# Patient Record
Sex: Male | Born: 1950 | Race: White | Hispanic: No | Marital: Married | Smoking: Former smoker
Health system: Southern US, Community
[De-identification: ages and names within clinical notes are randomized; demographics above are authoritative.]

## PROBLEM LIST (undated history)

## (undated) ENCOUNTER — Emergency Department: Discharge status: Absent without leave | Payer: Medicare Other

## (undated) DIAGNOSIS — F32A Depression, unspecified: Secondary | ICD-10-CM

## (undated) DIAGNOSIS — F419 Anxiety disorder, unspecified: Secondary | ICD-10-CM

## (undated) DIAGNOSIS — Z8249 Family history of ischemic heart disease and other diseases of the circulatory system: Secondary | ICD-10-CM

## (undated) DIAGNOSIS — I4892 Unspecified atrial flutter: Secondary | ICD-10-CM

## (undated) DIAGNOSIS — R0600 Dyspnea, unspecified: Secondary | ICD-10-CM

## (undated) DIAGNOSIS — I219 Acute myocardial infarction, unspecified: Secondary | ICD-10-CM

## (undated) DIAGNOSIS — E669 Obesity, unspecified: Secondary | ICD-10-CM

## (undated) DIAGNOSIS — E118 Type 2 diabetes mellitus with unspecified complications: Secondary | ICD-10-CM

## (undated) DIAGNOSIS — R059 Cough, unspecified: Secondary | ICD-10-CM

## (undated) DIAGNOSIS — I499 Cardiac arrhythmia, unspecified: Secondary | ICD-10-CM

## (undated) DIAGNOSIS — M1990 Unspecified osteoarthritis, unspecified site: Secondary | ICD-10-CM

## (undated) DIAGNOSIS — R011 Cardiac murmur, unspecified: Secondary | ICD-10-CM

## (undated) DIAGNOSIS — R05 Cough: Secondary | ICD-10-CM

## (undated) DIAGNOSIS — T8859XA Other complications of anesthesia, initial encounter: Secondary | ICD-10-CM

## (undated) DIAGNOSIS — I2510 Atherosclerotic heart disease of native coronary artery without angina pectoris: Secondary | ICD-10-CM

## (undated) DIAGNOSIS — E785 Hyperlipidemia, unspecified: Secondary | ICD-10-CM

## (undated) DIAGNOSIS — I10 Essential (primary) hypertension: Secondary | ICD-10-CM

## (undated) DIAGNOSIS — F329 Major depressive disorder, single episode, unspecified: Secondary | ICD-10-CM

## (undated) DIAGNOSIS — R918 Other nonspecific abnormal finding of lung field: Secondary | ICD-10-CM

## (undated) DIAGNOSIS — T4145XA Adverse effect of unspecified anesthetic, initial encounter: Secondary | ICD-10-CM

## (undated) DIAGNOSIS — K219 Gastro-esophageal reflux disease without esophagitis: Secondary | ICD-10-CM

## (undated) HISTORY — DX: Unspecified atrial flutter: I48.92

## (undated) HISTORY — PX: CORONARY ARTERY BYPASS GRAFT: SHX141

## (undated) HISTORY — PX: CARDIAC SURGERY: SHX584

## (undated) SURGERY — CARDIOVERSION (CATH LAB)
Anesthesia: Moderate Sedation

## (undated) SURGERY — ECHOCARDIOGRAM, TRANSESOPHAGEAL
Anesthesia: Moderate Sedation

---
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
## 2002-12-25 HISTORY — PX: CARDIAC CATHETERIZATION: SHX172

## 2006-02-04 ENCOUNTER — Other Ambulatory Visit: Payer: Self-pay

## 2006-02-04 ENCOUNTER — Emergency Department: Payer: Self-pay | Admitting: Emergency Medicine

## 2007-01-02 ENCOUNTER — Emergency Department: Payer: Self-pay | Admitting: Unknown Physician Specialty

## 2007-01-02 ENCOUNTER — Other Ambulatory Visit: Payer: Self-pay

## 2008-10-19 ENCOUNTER — Emergency Department: Payer: Self-pay | Admitting: Emergency Medicine

## 2009-01-14 ENCOUNTER — Ambulatory Visit: Payer: Self-pay | Admitting: Gastroenterology

## 2009-01-17 ENCOUNTER — Emergency Department: Payer: Self-pay | Admitting: Emergency Medicine

## 2010-06-26 ENCOUNTER — Emergency Department: Payer: Self-pay | Admitting: Internal Medicine

## 2011-03-05 ENCOUNTER — Emergency Department: Payer: Self-pay | Admitting: Emergency Medicine

## 2011-11-14 ENCOUNTER — Observation Stay: Payer: Self-pay | Admitting: Internal Medicine

## 2013-10-24 DIAGNOSIS — G8929 Other chronic pain: Secondary | ICD-10-CM | POA: Insufficient documentation

## 2014-12-28 DIAGNOSIS — Z01818 Encounter for other preprocedural examination: Secondary | ICD-10-CM | POA: Insufficient documentation

## 2015-03-01 DIAGNOSIS — I10 Essential (primary) hypertension: Secondary | ICD-10-CM | POA: Diagnosis present

## 2015-03-01 DIAGNOSIS — E119 Type 2 diabetes mellitus without complications: Secondary | ICD-10-CM | POA: Diagnosis present

## 2015-03-01 DIAGNOSIS — R079 Chest pain, unspecified: Secondary | ICD-10-CM | POA: Diagnosis present

## 2015-03-01 DIAGNOSIS — I252 Old myocardial infarction: Secondary | ICD-10-CM | POA: Insufficient documentation

## 2016-07-30 ENCOUNTER — Emergency Department
Admission: EM | Admit: 2016-07-30 | Discharge: 2016-07-30 | Disposition: A | Discharge status: Home / Self Care | Payer: Medicare Other | Attending: Emergency Medicine | Admitting: Emergency Medicine

## 2016-07-30 ENCOUNTER — Emergency Department: Payer: Medicare Other

## 2016-07-30 ENCOUNTER — Encounter: Payer: Self-pay | Admitting: Emergency Medicine

## 2016-07-30 DIAGNOSIS — M25469 Effusion, unspecified knee: Secondary | ICD-10-CM | POA: Insufficient documentation

## 2016-07-30 DIAGNOSIS — Z7982 Long term (current) use of aspirin: Secondary | ICD-10-CM | POA: Insufficient documentation

## 2016-07-30 DIAGNOSIS — I2510 Atherosclerotic heart disease of native coronary artery without angina pectoris: Secondary | ICD-10-CM | POA: Insufficient documentation

## 2016-07-30 DIAGNOSIS — Z79899 Other long term (current) drug therapy: Secondary | ICD-10-CM | POA: Diagnosis not present

## 2016-07-30 DIAGNOSIS — Z87891 Personal history of nicotine dependence: Secondary | ICD-10-CM | POA: Insufficient documentation

## 2016-07-30 DIAGNOSIS — Z7984 Long term (current) use of oral hypoglycemic drugs: Secondary | ICD-10-CM | POA: Diagnosis not present

## 2016-07-30 DIAGNOSIS — M25562 Pain in left knee: Secondary | ICD-10-CM | POA: Diagnosis present

## 2016-07-30 DIAGNOSIS — E119 Type 2 diabetes mellitus without complications: Secondary | ICD-10-CM | POA: Diagnosis not present

## 2016-07-30 DIAGNOSIS — I252 Old myocardial infarction: Secondary | ICD-10-CM | POA: Diagnosis not present

## 2016-07-30 HISTORY — DX: Atherosclerotic heart disease of native coronary artery without angina pectoris: I25.10

## 2016-07-30 HISTORY — DX: Unspecified osteoarthritis, unspecified site: M19.90

## 2016-07-30 LAB — BASIC METABOLIC PANEL
Anion gap: 11 (ref 5–15)
BUN: 16 mg/dL (ref 6–20)
CO2: 22 mmol/L (ref 22–32)
Calcium: 9.5 mg/dL (ref 8.9–10.3)
Chloride: 102 mmol/L (ref 101–111)
Creatinine, Ser: 0.72 mg/dL (ref 0.61–1.24)
GFR calc Af Amer: >60 mL/min (ref >60)
GFR calc non Af Amer: >60 mL/min (ref >60)
Glucose, Bld: 200 mg/dL — ABNORMAL HIGH (ref 65–99)
Potassium: 4.3 mmol/L (ref 3.5–5.1)
Sodium: 135 mmol/L (ref 135–145)

## 2016-07-30 LAB — CBC WITH DIFFERENTIAL/PLATELET
Basophils Absolute: 0.1 10*3/uL (ref 0–0.1)
Basophils Relative: 1 %
Eosinophils Absolute: 0.7 10*3/uL (ref 0–0.7)
Eosinophils Relative: 8 %
HCT: 42.6 % (ref 40.0–52.0)
Hemoglobin: 15.6 g/dL (ref 13.0–18.0)
Lymphocytes Relative: 18 %
Lymphs Abs: 1.6 10*3/uL (ref 1.0–3.6)
MCH: 33.1 pg (ref 26.0–34.0)
MCHC: 36.5 g/dL — ABNORMAL HIGH (ref 32.0–36.0)
MCV: 90.5 fL (ref 80.0–100.0)
Monocytes Absolute: 0.8 10*3/uL (ref 0.2–1.0)
Monocytes Relative: 9 %
Neutro Abs: 5.9 10*3/uL (ref 1.4–6.5)
Neutrophils Relative %: 64 %
Platelets: 279 10*3/uL (ref 150–440)
RBC: 4.71 MIL/uL (ref 4.40–5.90)
RDW: 13 % (ref 11.5–14.5)
WBC: 9 10*3/uL (ref 3.8–10.6)

## 2016-07-30 LAB — SEDIMENTATION RATE: Sed Rate: 5 mm/hr (ref 0–20)

## 2016-07-30 MED ORDER — NAPROXEN 500 MG PO TABS
500.0000 mg | ORAL_TABLET | Freq: Once | ORAL | Status: AC
  Administered 2016-07-30: 500 mg via ORAL
  Filled 2016-07-30: qty 1

## 2016-07-30 MED ORDER — NAPROXEN 500 MG PO TABS: 500.0000 mg | ORAL_TABLET | Freq: Two times a day (BID) | ORAL | Status: DC

## 2016-07-30 NOTE — ED Triage Notes (Signed)
Patient states he took treatments from [facility] on 10/26, treatments to both knees.  C/O bilateral knee swelling, onset yesterday.  Patient states that swelling is listed as part of post procedure coarse.

## 2016-07-30 NOTE — ED Notes (Addendum)
See triage note.  states he had a treatment at [facility] last week.  Noticed some stiffness yesterday  But was able to move  Woke up with increased stiffness and swelling this am  Unable to bear wt

## 2016-07-30 NOTE — Discharge Instructions (Signed)
Otherwise bilateral elastic knee support.

## 2016-07-30 NOTE — ED Provider Notes (Signed)
[facility] Emergency Department Provider Note   ____________________________________________   First MD Initiated Contact with Patient 07/30/16 [phone]     (approximate)  I have reviewed the triage vital signs and the nursing notes.   HISTORY  Chief Complaint Knee Pain    HPI Derrick Gallegos is a 65 y.o. male patient complaining of bilateral knee pain status post aspiration and injection of collagen at [facility] clinic one week ago. Patient state initially felt relief from the injections but yesterday started noticing pain and stiffness in the knees. Patient awakened this morning with bilateral edema and increased stiffness with flexion of the knees. Patient denies any fever or erythema.No palliative measures for this complaint. Patient state this is a new medication different from his previous injections to his knee.  Past Medical History:  Diagnosis Date  . Arthritis   . Coronary artery disease   . Diabetes mellitus without complication ([place])   . MI, old     There are no active problems to display for this patient.   History reviewed. No pertinent surgical history.  Prior to Admission medications   Medication Sig Start Date End Date Taking? Authorizing Provider  aspirin 81 MG tablet Take 81 mg by mouth daily.   Yes Historical Provider, MD  lisinopril (PRINIVIL,ZESTRIL) 10 MG tablet Take 10 mg by mouth daily.   Yes Historical Provider, MD  metFORMIN (GLUCOPHAGE) 500 MG tablet Take 500 mg by mouth 2 (two) times daily with a meal.   Yes Historical Provider, MD  metoprolol succinate (TOPROL-XL) 25 MG 24 hr tablet Take 25 mg by mouth daily.   Yes Historical Provider, MD  naproxen (NAPROSYN) 500 MG tablet Take 1 tablet (500 mg total) by mouth 2 (two) times daily with a meal. 07/30/16   Sable Feil, PA-C    Allergies Review of patient's allergies indicates no known allergies.  History reviewed. No pertinent family history.  Social History Social  History  Substance Use Topics  . Smoking status: Former Smoker    Types: Cigarettes  . Smokeless tobacco: Never Used  . Alcohol use No    Review of Systems Constitutional: No fever/chills Eyes: No visual changes. ENT: No sore throat. Cardiovascular: Denies chest pain. Respiratory: Denies shortness of breath. Gastrointestinal: No abdominal pain.  No nausea, no vomiting.  No diarrhea.  No constipation. Genitourinary: Negative for dysuria. Musculoskeletal: Bilateral knee stiffness  Skin: Negative for rash. Neurological: Negative for headaches, focal weakness or numbness.   ____________________________________________   PHYSICAL EXAM:  VITAL SIGNS: ED Triage Vitals  Enc Vitals Group     BP 07/30/16 0714 (!) 134/98     Pulse Rate 07/30/16 0714 72     Resp 07/30/16 0714 18     Temp 07/30/16 0714 97.7 F (36.5 C)     Temp Source 07/30/16 0714 Oral     SpO2 07/30/16 0714 97 %     Weight 07/30/16 0714 270 lb (122.5 kg)     Height 07/30/16 0714 '5\' 9"'$  (1.753 m)     Head Circumference --      Peak Flow --      Pain Score 07/30/16 0715 0     Pain Loc --      Pain Edu? --      Excl. in [place]? --     Constitutional: Alert and oriented. Well appearing and in no acute distress. Eyes: Conjunctivae are normal. PERRL. EOMI. Head: Atraumatic. Nose: No congestion/rhinnorhea. Mouth/Throat: Mucous membranes are moist.  Oropharynx non-erythematous. Neck: No stridor.  No cervical spine tenderness to palpation. Hematological/Lymphatic/Immunilogical: No cervical lymphadenopathy. Cardiovascular: Normal rate, regular rhythm. Grossly normal heart sounds.  Good peripheral circulation. Respiratory: Normal respiratory effort.  No retractions. Lungs CTAB. Gastrointestinal: Soft and nontender. No distention. No abdominal bruits. No CVA tenderness. Musculoskeletal: No obvious deformity. Obvious edema bilateral anterior patella..  Bilateral joint effusions. Neurologic:  Normal speech and language. No  gross focal neurologic deficits are appreciated. No gait instability. Skin:  Skin is warm, dry and intact. No rash noted. Psychiatric: Mood and affect are normal. Speech and behavior are normal.  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 Bilateral knee effusion bilateral knee effusion with severe arthritis. _____________________________________   PROCEDURES  Procedure(s) performed: None  Procedures  Critical Care performed: No  ____________________________________________   INITIAL IMPRESSION / ASSESSMENT AND PLAN / ED COURSE  Pertinent labs & imaging results that were available during my care of the patient were reviewed by me and considered in my medical decision making (see chart for details).  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 Discussed x-ray finding with patient. Patient given discharge care instructions. Patient advised to follow orthopedics if no improvement or worsens complaining of 48 hours. Patient given a prescription for naproxen.  Clinical Course     ____________________________________________   FINAL CLINICAL IMPRESSION(S) / ED DIAGNOSES  Final diagnoses:  Knee effusion, unspecified laterality      NEW MEDICATIONS STARTED DURING THIS VISIT:  New Prescriptions   NAPROXEN (NAPROSYN) 500 MG TABLET    Take 1 tablet (500 mg total) by  mouth 2 (two) times daily with a meal.     Note:  This document was prepared using Dragon voice recognition software and may include unintentional dictation errors.    Sable Feil, PA-C 07/30/16 [place], PA-C 07/30/16 1287    Lavonia Drafts, MD 07/30/16 1002

## 2017-08-18 ENCOUNTER — Emergency Department: Payer: Medicare Other

## 2017-08-18 ENCOUNTER — Emergency Department
Admission: EM | Admit: 2017-08-18 | Discharge: 2017-08-18 | Disposition: A | Discharge status: Home / Self Care | Payer: Medicare Other | Attending: Emergency Medicine | Admitting: Emergency Medicine

## 2017-08-18 ENCOUNTER — Encounter: Payer: Self-pay | Admitting: Emergency Medicine

## 2017-08-18 DIAGNOSIS — R600 Localized edema: Secondary | ICD-10-CM | POA: Diagnosis present

## 2017-08-18 DIAGNOSIS — I2510 Atherosclerotic heart disease of native coronary artery without angina pectoris: Secondary | ICD-10-CM | POA: Diagnosis not present

## 2017-08-18 DIAGNOSIS — Z7982 Long term (current) use of aspirin: Secondary | ICD-10-CM | POA: Insufficient documentation

## 2017-08-18 DIAGNOSIS — R202 Paresthesia of skin: Secondary | ICD-10-CM | POA: Diagnosis not present

## 2017-08-18 DIAGNOSIS — E119 Type 2 diabetes mellitus without complications: Secondary | ICD-10-CM | POA: Insufficient documentation

## 2017-08-18 DIAGNOSIS — Z7984 Long term (current) use of oral hypoglycemic drugs: Secondary | ICD-10-CM | POA: Diagnosis not present

## 2017-08-18 DIAGNOSIS — Z791 Long term (current) use of non-steroidal anti-inflammatories (NSAID): Secondary | ICD-10-CM | POA: Diagnosis not present

## 2017-08-18 DIAGNOSIS — Z79899 Other long term (current) drug therapy: Secondary | ICD-10-CM | POA: Insufficient documentation

## 2017-08-18 DIAGNOSIS — Z87891 Personal history of nicotine dependence: Secondary | ICD-10-CM | POA: Insufficient documentation

## 2017-08-18 LAB — CBC WITH DIFFERENTIAL/PLATELET
Basophils Absolute: 0.1 10*3/uL (ref 0–0.1)
Basophils Relative: 1 %
Eosinophils Absolute: 0.2 10*3/uL (ref 0–0.7)
Eosinophils Relative: 2 %
HCT: 43 % (ref 40.0–52.0)
Hemoglobin: 15.2 g/dL (ref 13.0–18.0)
Lymphocytes Relative: 31 %
Lymphs Abs: 2.8 10*3/uL (ref 1.0–3.6)
MCH: 32.6 pg (ref 26.0–34.0)
MCHC: 35.2 g/dL (ref 32.0–36.0)
MCV: 92.4 fL (ref 80.0–100.0)
Monocytes Absolute: 0.7 10*3/uL (ref 0.2–1.0)
Monocytes Relative: 8 %
Neutro Abs: 5.2 10*3/uL (ref 1.4–6.5)
Neutrophils Relative %: 58 %
Platelets: 315 10*3/uL (ref 150–440)
RBC: 4.65 MIL/uL (ref 4.40–5.90)
RDW: 13.2 % (ref 11.5–14.5)
WBC: 8.9 10*3/uL (ref 3.8–10.6)

## 2017-08-18 LAB — COMPREHENSIVE METABOLIC PANEL
ALT: 21 U/L (ref 17–63)
AST: 24 U/L (ref 15–41)
Albumin: 4.5 g/dL (ref 3.5–5.0)
Alkaline Phosphatase: 57 U/L (ref 38–126)
Anion gap: 10 (ref 5–15)
BUN: 20 mg/dL (ref 6–20)
CO2: 25 mmol/L (ref 22–32)
Calcium: 10 mg/dL (ref 8.9–10.3)
Chloride: 103 mmol/L (ref 101–111)
Creatinine, Ser: 0.69 mg/dL (ref 0.61–1.24)
GFR calc Af Amer: >60 mL/min (ref >60)
GFR calc non Af Amer: >60 mL/min (ref >60)
Glucose, Bld: 114 mg/dL — ABNORMAL HIGH (ref 65–99)
Potassium: 4.1 mmol/L (ref 3.5–5.1)
Sodium: 138 mmol/L (ref 135–145)
Total Bilirubin: 0.5 mg/dL (ref 0.3–1.2)
Total Protein: 7.8 g/dL (ref 6.5–8.1)

## 2017-08-18 LAB — TROPONIN I: Troponin I: <0.03 ng/mL (ref <0.03)

## 2017-08-18 MED ORDER — LORAZEPAM 1 MG PO TABS
1.0000 mg | ORAL_TABLET | Freq: Once | ORAL | Status: AC
  Administered 2017-08-18: 1 mg via ORAL
  Filled 2017-08-18: qty 1

## 2017-08-18 NOTE — ED Notes (Signed)
Pt returned from xray. Pt in NAD and is resting quietly on stretcher.

## 2017-08-18 NOTE — ED Provider Notes (Signed)
[facility] Emergency Department Provider Note  ____________________________________________   First MD Initiated Contact with Patient 08/18/17 1252     (approximate)  I have reviewed the triage vital signs and the nursing notes.   HISTORY  Chief Complaint Facial Swelling   HPI Derrick Gallegos is a 66 y.o. male date a Laski is a 66 year old male with a history of coronary artery disease as well as panic attacks was presented to the emergency department today with 1-2 weeks of tingling to his bilateral upper extremities. He says that he has full sensation of the bilateral upper extremities. Denies any weakness. Denies any shortness of breath or chest pain. Denies any neck pain. Says that he is under considerable stress because of situations with his family. Does not report any suicidal or homicidal ideation. Says that he also feels like he is having swelling under his eyes bilaterally. Does not report his legs being swollen.   Past Medical History:  Diagnosis Date  . Arthritis   . Coronary artery disease   . Diabetes mellitus without complication ([place])   . MI, old     There are no active problems to display for this patient.   Past Surgical History:  Procedure Laterality Date  . CARDIAC SURGERY      Prior to Admission medications   Medication Sig Start Date End Date Taking? Authorizing Provider  aspirin 81 MG tablet Take 81 mg by mouth daily.    [provider]  lisinopril (PRINIVIL,ZESTRIL) 10 MG tablet Take 10 mg by mouth daily.    [provider]  metFORMIN (GLUCOPHAGE) 500 MG tablet Take 500 mg by mouth 2 (two) times daily with a meal.    [provider]  metoprolol succinate (TOPROL-XL) 25 MG 24 hr tablet Take 25 mg by mouth daily.    [provider]  naproxen (NAPROSYN) 500 MG tablet Take 1 tablet (500 mg total) by mouth 2 (two) times daily with a meal. 07/30/16   Sable Feil, PA-C    Allergies Patient  has no known allergies.  History reviewed. No pertinent family history.  Social History Social History  Substance Use Topics  . Smoking status: Former Smoker    Types: Cigarettes  . Smokeless tobacco: Never Used  . Alcohol use No    Review of Systems  Constitutional: No fever/chills Eyes: No visual changes. ENT: No sore throat. Cardiovascular: Denies chest pain. Respiratory: Denies shortness of breath. Gastrointestinal: No abdominal pain.  No nausea, no vomiting.  No diarrhea.  No constipation. Genitourinary: Negative for dysuria. Musculoskeletal: Negative for back pain. Skin: Negative for rash. Neurological: Negative for headaches, focal weakness or numbness.   ____________________________________________   PHYSICAL EXAM:  VITAL SIGNS: ED Triage Vitals  Enc Vitals Group     BP 08/18/17 1251 125/87     Pulse Rate 08/18/17 1251 68     Resp 08/18/17 1251 18     Temp 08/18/17 1251 97.8 F (36.6 C)     Temp Source 08/18/17 1251 Oral     SpO2 08/18/17 1251 98 %     Weight 08/18/17 1252 (S) 260 lb (117.9 kg)     Height --      Head Circumference --      Peak Flow --      Pain Score 08/18/17 1251 0     Pain Loc --      Pain Edu? --      Excl. in [place]? --  Constitutional: Alert and oriented. Well appearing and in no acute distress. Eyes: Conjunctivae are normal.  Head: Atraumatic.I do not see any edema on gross examination nor do I palpate any under the eyes or on the face as a whole. Nose: No congestion/rhinnorhea. Mouth/Throat: Mucous membranes are moist. No tongue swelling noted. Neck: No stridor.  No tenderness to palpation to the cervical spine. Patient ranges her neck freely. Cardiovascular: Normal rate, regular rhythm. Grossly normal heart sounds.  Good peripheral circulation With equal bilateral radial pulses. Respiratory: Normal respiratory effort.  No retractions. Lungs CTAB. Gastrointestinal: Soft and nontender. No distention.  Musculoskeletal: No  lower extremity tenderness nor edema.  No joint effusions. Neurologic:  Normal speech and language. No gross focal neurologic deficits are appreciated. 5 out of 5 strength bilateral upper extremities which are also sensate to light touch bilaterally. Skin:  Skin is warm, dry and intact. No rash noted. Psychiatric: Mood and affect are normal. Speech and behavior are normal.  ____________________________________________   LABS (all labs ordered are listed, but only abnormal results are displayed)  Labs Reviewed  COMPREHENSIVE METABOLIC PANEL - Abnormal; Notable for the following:       Result Value   Glucose, Bld 114 (*)    All other components within normal limits  CBC WITH DIFFERENTIAL/PLATELET  TROPONIN I   ____________________________________________  EKG  ED ECG REPORT I, Doran Stabler, the attending physician, personally viewed and interpreted this ECG.   Date: 08/18/2017  EKG Time: 1258  Rate: 67  Rhythm: normal sinus rhythm  Axis: Normal  Intervals:none  ST&T Change: No ST segment elevation or depression. No abnormal T-wave inversion.  ____________________________________________  RADIOLOGY  Chest x-ray without any acute disease ____________________________________________   PROCEDURES  Procedure(s) performed:   Procedures  Critical Care performed:   ____________________________________________   INITIAL IMPRESSION / ASSESSMENT AND PLAN / ED COURSE  Pertinent labs & imaging results that were available during my care of the patient were reviewed by me and considered in my medical decision making (see chart for details).  ----------------------------------------- 2:47 PM on 08/18/2017 -----------------------------------------  Patient with persistent tingling to his bilateral upper extremities. Labs and imaging have been very reassuring. Unlikely to be radicular as he has no neck pain. We discussed head CT but the patient says that he would  rather follow-up in the office with his primary care doctor. He'll be discharged at this time.       ____________________________________________   FINAL CLINICAL IMPRESSION(S) / ED DIAGNOSES  Paresthesia.    NEW MEDICATIONS STARTED DURING THIS VISIT:  New Prescriptions   No medications on file     Note:  This document was prepared using Dragon voice recognition software and may include unintentional dictation errors.     Orbie Pyo, MD 08/18/17 [phone]

## 2017-08-18 NOTE — ED Notes (Signed)
Patient transported to X-ray 

## 2017-08-18 NOTE — ED Triage Notes (Signed)
Pt stating that he started with facial swelling that started yesterday. Pt stating weight has also increased by 10lbs in 2 days. Pt stating that he has had BUE numbness/tingling. EMS stating pt was diaphoretic on their arrival. Pt stating dizziness off and on since December.

## 2017-08-18 NOTE — ED Notes (Signed)
Dr. Clearnce Hasten in with pt at this time.

## 2017-08-18 NOTE — ED Notes (Signed)
Pt denying any SOB or CP. Pt is stating tightness in his chest with deep breaths.

## 2017-11-16 ENCOUNTER — Other Ambulatory Visit: Payer: Self-pay

## 2017-11-16 ENCOUNTER — Observation Stay
Admission: EM | Admit: 2017-11-16 | Discharge: 2017-11-17 | Disposition: A | Discharge status: Home / Self Care | Payer: Medicare Other | Attending: Internal Medicine | Admitting: Internal Medicine

## 2017-11-16 ENCOUNTER — Encounter: Payer: Self-pay | Admitting: Emergency Medicine

## 2017-11-16 ENCOUNTER — Emergency Department: Payer: Medicare Other

## 2017-11-16 DIAGNOSIS — F329 Major depressive disorder, single episode, unspecified: Secondary | ICD-10-CM | POA: Insufficient documentation

## 2017-11-16 DIAGNOSIS — Z6836 Body mass index (BMI) 36.0-36.9, adult: Secondary | ICD-10-CM | POA: Diagnosis not present

## 2017-11-16 DIAGNOSIS — I252 Old myocardial infarction: Secondary | ICD-10-CM | POA: Diagnosis not present

## 2017-11-16 DIAGNOSIS — Z8249 Family history of ischemic heart disease and other diseases of the circulatory system: Secondary | ICD-10-CM | POA: Diagnosis not present

## 2017-11-16 DIAGNOSIS — E669 Obesity, unspecified: Secondary | ICD-10-CM | POA: Diagnosis present

## 2017-11-16 DIAGNOSIS — R0602 Shortness of breath: Secondary | ICD-10-CM | POA: Insufficient documentation

## 2017-11-16 DIAGNOSIS — E782 Mixed hyperlipidemia: Secondary | ICD-10-CM | POA: Diagnosis not present

## 2017-11-16 DIAGNOSIS — Z951 Presence of aortocoronary bypass graft: Secondary | ICD-10-CM | POA: Diagnosis not present

## 2017-11-16 DIAGNOSIS — Z791 Long term (current) use of non-steroidal anti-inflammatories (NSAID): Secondary | ICD-10-CM | POA: Diagnosis not present

## 2017-11-16 DIAGNOSIS — R0789 Other chest pain: Principal | ICD-10-CM | POA: Insufficient documentation

## 2017-11-16 DIAGNOSIS — E785 Hyperlipidemia, unspecified: Secondary | ICD-10-CM | POA: Diagnosis not present

## 2017-11-16 DIAGNOSIS — Z7982 Long term (current) use of aspirin: Secondary | ICD-10-CM | POA: Diagnosis not present

## 2017-11-16 DIAGNOSIS — Z79899 Other long term (current) drug therapy: Secondary | ICD-10-CM | POA: Insufficient documentation

## 2017-11-16 DIAGNOSIS — I25118 Atherosclerotic heart disease of native coronary artery with other forms of angina pectoris: Secondary | ICD-10-CM

## 2017-11-16 DIAGNOSIS — I10 Essential (primary) hypertension: Secondary | ICD-10-CM | POA: Insufficient documentation

## 2017-11-16 DIAGNOSIS — E119 Type 2 diabetes mellitus without complications: Secondary | ICD-10-CM | POA: Diagnosis not present

## 2017-11-16 DIAGNOSIS — R079 Chest pain, unspecified: Secondary | ICD-10-CM | POA: Diagnosis not present

## 2017-11-16 DIAGNOSIS — M1990 Unspecified osteoarthritis, unspecified site: Secondary | ICD-10-CM | POA: Insufficient documentation

## 2017-11-16 DIAGNOSIS — E118 Type 2 diabetes mellitus with unspecified complications: Secondary | ICD-10-CM

## 2017-11-16 DIAGNOSIS — I2510 Atherosclerotic heart disease of native coronary artery without angina pectoris: Secondary | ICD-10-CM | POA: Diagnosis not present

## 2017-11-16 HISTORY — DX: Family history of ischemic heart disease and other diseases of the circulatory system: Z82.49

## 2017-11-16 HISTORY — DX: Hyperlipidemia, unspecified: E78.5

## 2017-11-16 HISTORY — DX: Essential (primary) hypertension: I10

## 2017-11-16 HISTORY — DX: Major depressive disorder, single episode, unspecified: F32.9

## 2017-11-16 HISTORY — DX: Obesity, unspecified: E66.9

## 2017-11-16 HISTORY — DX: Type 2 diabetes mellitus with unspecified complications: E11.8

## 2017-11-16 HISTORY — DX: Depression, unspecified: F32.A

## 2017-11-16 HISTORY — DX: Anxiety disorder, unspecified: F41.9

## 2017-11-16 LAB — BASIC METABOLIC PANEL
Anion gap: 9 (ref 5–15)
BUN: 15 mg/dL (ref 6–20)
CO2: 24 mmol/L (ref 22–32)
Calcium: 9.4 mg/dL (ref 8.9–10.3)
Chloride: 101 mmol/L (ref 101–111)
Creatinine, Ser: 0.53 mg/dL — ABNORMAL LOW (ref 0.61–1.24)
GFR calc Af Amer: >60 mL/min (ref >60)
GFR calc non Af Amer: >60 mL/min (ref >60)
Glucose, Bld: 139 mg/dL — ABNORMAL HIGH (ref 65–99)
Potassium: 4.2 mmol/L (ref 3.5–5.1)
Sodium: 134 mmol/L — ABNORMAL LOW (ref 135–145)

## 2017-11-16 LAB — CBC
HCT: 43.9 % (ref 40.0–52.0)
Hemoglobin: 15.5 g/dL (ref 13.0–18.0)
MCH: 32.6 pg (ref 26.0–34.0)
MCHC: 35.4 g/dL (ref 32.0–36.0)
MCV: 92 fL (ref 80.0–100.0)
Platelets: 318 10*3/uL (ref 150–440)
RBC: 4.77 MIL/uL (ref 4.40–5.90)
RDW: 13 % (ref 11.5–14.5)
WBC: 7.5 10*3/uL (ref 3.8–10.6)

## 2017-11-16 LAB — TROPONIN I
Troponin I: <0.03 ng/mL (ref <0.03)
Troponin I: <0.03 ng/mL (ref <0.03)
Troponin I: <0.03 ng/mL (ref <0.03)
Troponin I: <0.03 ng/mL (ref <0.03)

## 2017-11-16 MED ORDER — NAPROXEN 500 MG PO TABS
500.0000 mg | ORAL_TABLET | Freq: Two times a day (BID) | ORAL | Status: DC
  Administered 2017-11-16 – 2017-11-17 (×2): 500 mg via ORAL
  Filled 2017-11-16 (×3): qty 1

## 2017-11-16 MED ORDER — MORPHINE SULFATE (PF) 2 MG/ML IV SOLN
2.0000 mg | INTRAVENOUS | Status: DC | PRN
  Administered 2017-11-16 – 2017-11-17 (×2): 2 mg via INTRAVENOUS
  Filled 2017-11-16 (×2): qty 1

## 2017-11-16 MED ORDER — MORPHINE SULFATE (PF) 4 MG/ML IV SOLN
4.0000 mg | Freq: Once | INTRAVENOUS | Status: AC | PRN
  Administered 2017-11-16: 4 mg via INTRAVENOUS
  Filled 2017-11-16: qty 1

## 2017-11-16 MED ORDER — METFORMIN HCL 500 MG PO TABS
500.0000 mg | ORAL_TABLET | Freq: Two times a day (BID) | ORAL | Status: DC
  Administered 2017-11-16: 500 mg via ORAL
  Filled 2017-11-16: qty 1

## 2017-11-16 MED ORDER — NITROGLYCERIN 0.4 MG SL SUBL
0.4000 mg | SUBLINGUAL_TABLET | SUBLINGUAL | Status: DC | PRN
  Administered 2017-11-16 (×3): 0.4 mg via SUBLINGUAL
  Filled 2017-11-16: qty 1

## 2017-11-16 MED ORDER — GI COCKTAIL ~~LOC~~
30.0000 mL | Freq: Four times a day (QID) | ORAL | Status: DC | PRN
  Filled 2017-11-16: qty 30

## 2017-11-16 MED ORDER — ASPIRIN EC 81 MG PO TBEC
81.0000 mg | DELAYED_RELEASE_TABLET | Freq: Every day | ORAL | Status: DC
  Administered 2017-11-17: 81 mg via ORAL
  Filled 2017-11-16: qty 1

## 2017-11-16 MED ORDER — ENOXAPARIN SODIUM 40 MG/0.4ML ~~LOC~~ SOLN
40.0000 mg | SUBCUTANEOUS | Status: DC
Start: 2017-11-16 — End: 2017-11-17
  Administered 2017-11-16: 40 mg via SUBCUTANEOUS
  Filled 2017-11-16: qty 0.4

## 2017-11-16 MED ORDER — METOPROLOL SUCCINATE ER 25 MG PO TB24
25.0000 mg | ORAL_TABLET | Freq: Every day | ORAL | Status: DC
  Administered 2017-11-16: 25 mg via ORAL
  Filled 2017-11-16 (×2): qty 1

## 2017-11-16 MED ORDER — LISINOPRIL 10 MG PO TABS
10.0000 mg | ORAL_TABLET | Freq: Every day | ORAL | Status: DC
  Administered 2017-11-16: 10 mg via ORAL
  Filled 2017-11-16 (×2): qty 1

## 2017-11-16 MED ORDER — ALPRAZOLAM 0.25 MG PO TABS: 0.2500 mg | ORAL_TABLET | Freq: Two times a day (BID) | ORAL | Status: DC | PRN

## 2017-11-16 MED ORDER — ONDANSETRON HCL 4 MG/2ML IJ SOLN: 4.0000 mg | Freq: Four times a day (QID) | INTRAMUSCULAR | Status: DC | PRN

## 2017-11-16 MED ORDER — ACETAMINOPHEN 325 MG PO TABS
650.0000 mg | ORAL_TABLET | ORAL | Status: DC | PRN
  Administered 2017-11-16 – 2017-11-17 (×2): 650 mg via ORAL
  Filled 2017-11-16 (×2): qty 2

## 2017-11-16 MED ORDER — ASPIRIN 81 MG PO CHEW
324.0000 mg | CHEWABLE_TABLET | Freq: Once | ORAL | Status: AC
Start: 2017-11-16 — End: 2017-11-16
  Administered 2017-11-16: 324 mg via ORAL
  Filled 2017-11-16: qty 4

## 2017-11-16 NOTE — ED Notes (Signed)
MD at bedside. 

## 2017-11-16 NOTE — H&P (Addendum)
[facility] NAME: Derrick Gallegos    MR#:  [id number]  DATE OF BIRTH:  Feb 22, 1951  DATE OF ADMISSION:  11/16/2017  PRIMARY CARE PHYSICIAN: [facility] [place]   REQUESTING/REFERRING PHYSICIAN: Delman Kitten, MD  CHIEF COMPLAINT:   Chief Complaint  Patient presents with  . Chest Pain    HISTORY OF PRESENT ILLNESS:  Derrick Gallegos  is a 66 y.o. 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 Patient reports for about the last 2 days been experiencing an achy discomfort across his upper chest that radiates occasionally into his right arm.  He reports that he did notice it seems slightly worse and he was more winded than usual after walking about 1 block yesterday, he can usually walk several blocks. He takes baby aspirin daily.  he ahd some radiating pain in rt arm 2 weeks ago and is still there but not as bad. He is going through lots of financial/relationship stress. PAST MEDICAL HISTORY:   Past Medical History:  Diagnosis Date  . Arthritis   . Coronary artery disease   . Diabetes mellitus without complication ([place])   . MI, old     PAST SURGICAL HISTORY:   Past Surgical History:  Procedure Laterality Date  . CARDIAC SURGERY      SOCIAL HISTORY:   Social History   Tobacco Use  . Smoking status: Former Smoker    Types: Cigarettes  . Smokeless tobacco: Never Used  Substance Use Topics  . Alcohol use: No  Sharyon Cable all his life FAMILY HISTORY:  No family history on file. Dad died MI @ 49 yr DRUG ALLERGIES:  No Known Allergies  REVIEW OF SYSTEMS:   Review of Systems  Constitutional: Negative for chills, fever and weight loss.  HENT: Negative for nosebleeds and sore throat.   Eyes: Negative for blurred vision.  Respiratory: Positive for shortness of breath. Negative for cough and wheezing.   Cardiovascular: Positive for chest pain. Negative for orthopnea, leg  swelling and PND.  Gastrointestinal: Negative for abdominal pain, constipation, diarrhea, heartburn, nausea and vomiting.  Genitourinary: Negative for dysuria and urgency.  Musculoskeletal: Negative for back pain.  Skin: Negative for rash.  Neurological: Negative for dizziness, speech change, focal weakness and headaches.  Endo/Heme/Allergies: Does not bruise/bleed easily.  Psychiatric/Behavioral: Negative for depression.   MEDICATIONS AT HOME:   Prior to Admission medications   Medication Sig Start Date End Date Taking? Authorizing Provider  aspirin 81 MG tablet Take 81 mg by mouth daily.   Yes [provider]  glipiZIDE (GLUCOTROL) 5 MG tablet Take 5 mg by mouth at bedtime.   Yes [provider]  lisinopril (PRINIVIL,ZESTRIL) 20 MG tablet Take 10 mg by mouth daily.   Yes [provider]  metFORMIN (GLUCOPHAGE) 500 MG tablet Take 1,000 mg by mouth at bedtime.    Yes [provider]  metoprolol succinate (TOPROL-XL) 25 MG 24 hr tablet Take 25 mg by mouth daily.   Yes [provider]  Omega-3 Fatty Acids (FISH OIL) 1200 MG CAPS Take 2,400 mg by mouth at bedtime.   Yes [provider]  naproxen (NAPROSYN) 500 MG tablet Take 1 tablet (500 mg total) by mouth 2 (two) times daily with a meal. Patient not taking: Reported on 11/16/2017 07/30/16   Sable Feil, PA-C      VITAL SIGNS:  Blood pressure 124/73, pulse  63, temperature 98.2 F (36.8 C), resp. rate 18, height 5\' 11"  (1.803 m), weight 117.8 kg (259 lb 9.6 oz), SpO2 100 %.  PHYSICAL EXAMINATION:  Physical Exam  GENERAL:  66 y.o.-year-old patient lying in the bed with no acute distress.  EYES: Pupils equal, round, reactive to light and accommodation. No scleral icterus. Extraocular muscles intact.  HEENT: Head atraumatic, normocephalic. Oropharynx and nasopharynx clear.  NECK:  Supple, no jugular venous distention. No thyroid enlargement, no tenderness.  LUNGS: Normal breath  sounds bilaterally, no wheezing, rales,rhonchi or crepitation. No use of accessory muscles of respiration.  CARDIOVASCULAR: S1, S2 normal. No murmurs, rubs, or gallops.  ABDOMEN: Soft, nontender, nondistended. Bowel sounds present. No organomegaly or mass.  EXTREMITIES: No pedal edema, cyanosis, or clubbing.  NEUROLOGIC: Cranial nerves II through XII are intact. Muscle strength 5/5 in all extremities. Sensation intact. Gait not checked.  PSYCHIATRIC: The patient is alert and oriented x 3.  SKIN: No obvious rash, lesion, or ulcer.  LABORATORY PANEL:   CBC Recent Labs  Lab 11/16/17 1122  WBC 7.5  HGB 15.5  HCT 43.9  PLT 318   ------------------------------------------------------------------------------------------------------------------  Chemistries  Recent Labs  Lab 11/16/17 1122  NA 134*  K 4.2  CL 101  CO2 24  GLUCOSE 139*  BUN 15  CREATININE 0.53*  CALCIUM 9.4   ------------------------------------------------------------------------------------------------------------------  Cardiac Enzymes Recent Labs  Lab 11/16/17 1315  TROPONINI <0.03   ------------------------------------------------------------------------------------------------------------------  RADIOLOGY:  Dg Chest 2 View  Result Date: 11/16/2017 CLINICAL DATA:  Bilateral arm pain and chest pain EXAM: CHEST  2 VIEW COMPARISON:  08/18/2017 FINDINGS: Cardiac shadows within normal limits. Postsurgical changes are again seen and stable. The lungs are well aerated bilaterally. No focal infiltrate or sizable effusion is seen. Mild degenerative changes of thoracic spine are noted. IMPRESSION: No acute abnormality noted. Electronically Signed   By: Inez Catalina M.D.   On: 11/16/2017 11:55   IMPRESSION AND PLAN:  46 y m with chest pain  * Chest pain: - Serial Troponins - tele - asa, nitro, Metoprolol, Lisinopril - Echo, Myoview (if cardio feels appropriate) - Cardio c/s - d/w Dr Candis Musa  *  Anxiety/Depression - Psych c/s  * DM - Continue glipizide  * CAD: - continue ASA, Nitro for now    All the records are reviewed and case discussed with ED provider. Management plans discussed with the patient, family (Sister at bedside) and they are in agreement.  CODE STATUS: FULL CODE  TOTAL TIME TAKING CARE OF THIS PATIENT: 45 minutes.    Max Sane M.D on 11/16/2017 at 2:35 PM  Between 7am to 6pm - Pager - [phone]  After 6pm go to [email or web address] - Technical brewer [facility]  Office  [phone]  CC: Primary care physician; [facility] [place]   Note: This dictation was prepared with Diplomatic Services operational officer dictation along with smaller phrase technology. Any transcriptional errors that result from this process are unintentional.

## 2017-11-16 NOTE — ED Triage Notes (Signed)
Arrives with C/O Chest Pain x 2 days.  Symptoms include chest pain and today became diaphoretic this morning while sitting and drinking coffee.  States pain feels like a soreness across chest currently.  Pain worse with deep breathing and moment.

## 2017-11-16 NOTE — Consult Note (Signed)
Cardiology Consultation:   Patient ID: Derrick Gallegos; [id number]; 14-Nov-1951   Admit date: 11/16/2017 Date of Consult: 11/16/2017  Primary Care Provider: Center, [place] Primary Cardiologist: New to [facility] - consult by Rockey Situ   Patient Profile:   Derrick Gallegos is a 66 y.o. male with a hx of CAD s/p 4-vessel CABG in 2004 at [place], DM2, HTN, HLD with statin intolerance, prior tobacco abuse, family history of premature CAD, morbid obesity, anxiety, and depression  who is being seen today for the evaluation of chest pain at the request of Dr. Manuella Ghazi.  History of Present Illness:   Mr. Parkerson was previously followed by [facility] years prior. He underwent 3-vessel CABG at [facility] in 2004 (LIMA and radial artery used). He was lost to follow up for ~ 1 year after than 2/2 depression and established with Dr. Nehemiah Massed in ~2005, though never followed up on recommended testing. He has known family history of premature CAD with his father passing away from an MI at age 33. His mother and brother also have known CAD. Patient was a prior smoker, has known DM, HTN, HLD with statin intolerance, and obesity. He works as a Curator several days per week. Approximately 2 weeks ago he developed right arm pain/heaviness/weakness that was not associated with any activity he could remember. These symptoms gradually improved on their own without intervention. On 11/21, he developed chest tightness across his anterior chest wall. He does not describe this as pain, but as tightness. Reports it hurts worse with coughing or moving around. Again, he initially thought this was related to some activity, but could not recall over-exerting himself. Pain lasted most of the day on 11/21. On 11/22, he was asymptomatic. When he woke up on 11/23, he again noted this tightness across his chest after drinking his coffee. This episode was associated with diaphoresis. No associated SOB, nausea, vomiting,  palpitations, dizziness, presyncope, or syncope. Tightness again was worse with coughing or movement. Because the pain returned this morning he proceeded to [facility] ED.   Upon the patient's arrival to [facility] they were found to have BP 135/91, HR 69 bpm, temp 98.2, oxygen saturation 100% on room air, weight 260 pounds pounds. EKG showed NSR, 68 bpm, < 1 mm inferior st elevation not meeting stemi criteria without reciprocal changes, CXR showed no acute abnormality. Labs showed troponin negative x 2, Na 134, K+ 4.2, BUN 15, SCr 0.53, glucose 139, unremarkable cbc. In the ED he was given ASA 324 mg x 1, SL NTG x 3, and morphine 6 mg total. He continues to note a tightness across his anterior chest wall that is worse with coughing, and movement. It is also reproducible to him pressing on his chest. IM has ordered an echo and nuclear stress test. Cardiology asked to evaluate. He also has a psych consult pending per IM for anxiety and depression.   Past Medical History:  Diagnosis Date  . Anxiety   . Arthritis   . Coronary artery disease    a. s/p 3-v cabg in 2004 at [place] (LIMA and radial arery used - no further details in [place])  . Depression   . Diabetes mellitus with complication ([place])   . Essential hypertension   . Family history of premature CAD    a. father passed away from an MI at 42  . HLD (hyperlipidemia)    a. statin intolerant   . Obesity     Past Surgical History:  Procedure Laterality Date  . CARDIAC SURGERY       Home Meds: Prior to Admission medications   Medication Sig Start Date End Date Taking? Authorizing Provider  aspirin 81 MG tablet Take 81 mg by mouth daily.   Yes [provider]  glipiZIDE (GLUCOTROL) 5 MG tablet Take 5 mg by mouth at bedtime.   Yes [provider]  lisinopril (PRINIVIL,ZESTRIL) 20 MG tablet Take 10 mg by mouth daily.   Yes [provider]  metFORMIN (GLUCOPHAGE) 500 MG tablet Take 1,000 mg by mouth at bedtime.    Yes  [provider]  metoprolol succinate (TOPROL-XL) 25 MG 24 hr tablet Take 25 mg by mouth daily.   Yes [provider]  Omega-3 Fatty Acids (FISH OIL) 1200 MG CAPS Take 2,400 mg by mouth at bedtime.   Yes [provider]  naproxen (NAPROSYN) 500 MG tablet Take 1 tablet (500 mg total) by mouth 2 (two) times daily with a meal. Patient not taking: Reported on 11/16/2017 07/30/16   Sable Feil, PA-C    Inpatient Medications: Scheduled Meds: . aspirin EC  81 mg Oral Daily  . enoxaparin (LOVENOX) injection  40 mg Subcutaneous Q24H  . lisinopril  10 mg Oral Daily  . metFORMIN  500 mg Oral BID WC  . metoprolol succinate  25 mg Oral Daily  . naproxen  500 mg Oral BID WC   Continuous Infusions:  PRN Meds: acetaminophen, ALPRAZolam, gi cocktail, morphine injection, nitroGLYCERIN, ondansetron (ZOFRAN) IV  Allergies:  No Known Allergies  Social History:   Social History   Socioeconomic History  . Marital status: Married    Spouse name: Not on file  . Number of children: Not on file  . Years of education: Not on file  . Highest education level: Not on file  Social Needs  . Financial resource strain: Not on file  . Food insecurity - worry: Not on file  . Food insecurity - inability: Not on file  . Transportation needs - medical: Not on file  . Transportation needs - non-medical: Not on file  Occupational History  . Not on file  Tobacco Use  . Smoking status: Former Smoker    Types: Cigarettes  . Smokeless tobacco: Never Used  Substance and Sexual Activity  . Alcohol use: No  . Drug use: No  . Sexual activity: Not on file  Other Topics Concern  . Not on file  Social History Narrative  . Not on file     Family History:  Family History  Problem Relation Age of Onset  . CAD Mother   . CAD Father        a. MI at 57-->death  . CAD Brother     ROS:  Review of Systems  Constitutional: Positive for diaphoresis and malaise/fatigue. Negative for  chills, fever and weight loss.  HENT: Negative for congestion.   Eyes: Negative for discharge and redness.  Respiratory: Negative for cough, hemoptysis, sputum production, shortness of breath and wheezing.   Cardiovascular: Positive for chest pain. Negative for palpitations, orthopnea, claudication, leg swelling and PND.  Gastrointestinal: Negative for abdominal pain, blood in stool, heartburn, melena, nausea and vomiting.  Genitourinary: Negative for hematuria.  Musculoskeletal: Positive for joint pain. Negative for falls and myalgias.       Right shoulder pain  Skin: Negative for rash.  Neurological: Positive for weakness. Negative for dizziness, tingling, tremors, sensory change, speech change, focal weakness and loss of consciousness.  Endo/Heme/Allergies: Does  not bruise/bleed easily.  Psychiatric/Behavioral: Negative for substance abuse. The patient is nervous/anxious.   All other systems reviewed and are negative.     Physical Exam/Data:   Vitals:   11/16/17 1209 11/16/17 1230 11/16/17 1315 11/16/17 1347  BP: (!) 148/94 (!) 119/95 106/82 124/73  Pulse: 62 68 (!) 57 63  Resp: 12 10 13 18   Temp:    98.2 F (36.8 C)  SpO2: 95% 96% 96% 100%  Weight:    259 lb 9.6 oz (117.8 kg)  Height:    5\' 11"  (1.803 m)   No intake or output data in the 24 hours ending 11/16/17 1611 Filed Weights   11/16/17 1119 11/16/17 1347  Weight: 260 lb (117.9 kg) 259 lb 9.6 oz (117.8 kg)   Body mass index is 36.21 kg/m.   Physical Exam: General: Well developed, well nourished, in no acute distress. Head: Normocephalic, atraumatic, sclera non-icteric, no xanthomas, nares without discharge.  Neck: Negative for carotid bruits. JVD not elevated. Lungs: Clear bilaterally to auscultation without wheezes, rales, or rhonchi. Breathing is unlabored. Heart: RRR with S1 S2. No murmurs, rubs, or gallops appreciated. Pain is reproducible to palpation.  Abdomen: Soft, non-tender, non-distended with normoactive  bowel sounds. No hepatomegaly. No rebound/guarding. No obvious abdominal masses. Msk:  Strength and tone appear normal for age. Extremities: No clubbing or cyanosis. No edema. Distal pedal pulses are 2+ and equal bilaterally. Neuro: Alert and oriented X 3. No facial asymmetry. No focal deficit. Moves all extremities spontaneously. Psych:  Responds to questions appropriately with a normal affect.   EKG:  The EKG was personally reviewed and demonstrates: NSR, 68 bpm, inferior st elevation of < 1 mm not meeting stemi criteria without reciprocal changes Telemetry:  Telemetry was personally reviewed and demonstrates: NSR  Weights: Filed Weights   11/16/17 1119 11/16/17 1347  Weight: 260 lb (117.9 kg) 259 lb 9.6 oz (117.8 kg)    Relevant CV Studies: Myoview pending  Laboratory Data:  Chemistry Recent Labs  Lab 11/16/17 1122  NA 134*  K 4.2  CL 101  CO2 24  GLUCOSE 139*  BUN 15  CREATININE 0.53*  CALCIUM 9.4  GFRNONAA >60  GFRAA >60  ANIONGAP 9    No results for input(s): PROT, ALBUMIN, AST, ALT, ALKPHOS, BILITOT in the last 168 hours. Hematology Recent Labs  Lab 11/16/17 1122  WBC 7.5  RBC 4.77  HGB 15.5  HCT 43.9  MCV 92.0  MCH 32.6  MCHC 35.4  RDW 13.0  PLT 318   Cardiac Enzymes Recent Labs  Lab 11/16/17 1122 11/16/17 1315  TROPONINI <0.03 <0.03   No results for input(s): TROPIPOC in the last 168 hours.  BNPNo results for input(s): BNP, PROBNP in the last 168 hours.  DDimer No results for input(s): DDIMER in the last 168 hours.  Radiology/Studies:  Dg Chest 2 View  Result Date: 11/16/2017 IMPRESSION: No acute abnormality noted. Electronically Signed   By: Inez Catalina M.D.   On: 11/16/2017 11:55    Assessment and Plan:   1. Chest pain with moderate risk of cardiac etiology: -Symptoms sound fairly atypical in that they are worse with coughing, positional movement, and reproducible to palpation  -However, the patient has significant risk factors  including known CAD s/p CABG, family history of premature CAD, prior tobacco abuse, DM, HTN, HLD, obesity, and male > 50  -No exertional symptoms -Troponin negative x 2, continue to cycle 3rd troponin to rule out -Continue to hold heparin gtt unless troponin  is positive  -He would like nuclear stress testing prior to discharge, this has been scheduled for 11/24 -He is unable to walk on a treadmill 2/2 knee pain, thus he will undergo Lexiscan Myoview -Risks and benefits have been discussed with him in detail -If Myoview is normal, recommend IM treat for atypical chest pain -Echo ordered per IM -NPO after midnight   2. CAD s/p 3-vessel CABG in 2004/family history of premature CAD: -Ischemic evaluation as above -ASA -Toprol -Aggressive risk factor modification  3. HLD: -Statin intolerant -Check FLP, if LDL is > 70, consider evaluation for PCSK9 inhibitor as an outpatient   4. Morbid obesity: -Weight loss advised   5. Anxiety/depression: -Per IM  6. DM: -Consider holding metformin and placing on SSI while inpatient in case he requires ischemic evaluation, will defer to IM -Check A1c   For questions or updates, please contact [place] Please consult [email or web address] for contact info under Cardiology/STEMI.   Signed, Christell Faith, PA-C Agency Pager: [phone] 11/16/2017, 4:11 PM

## 2017-11-16 NOTE — ED Notes (Signed)
PT up to the bathroom and ambulatory independently. Pt denies dizziness at this time. Pt is in NAD. Pt verbalized understanding of admission process and treatment plan.

## 2017-11-16 NOTE — ED Provider Notes (Signed)
[facility] Emergency Department Provider Note   ____________________________________________   First MD Initiated Contact with Patient 11/16/17 1211     (approximate)  I have reviewed the triage vital signs and the nursing notes.   HISTORY  Chief Complaint Chest Pain    HPI Ousman Dise Scheffel is a 66 y.o. male here for evaluation of chest pain  Patient reports for about the last 2 days been experiencing an achy discomfort across his upper chest that radiates occasionally into his right arm.  He reports that he did notice it seems slightly worse and he was more winded than usual after walking about 1 block yesterday, he can usually walk several blocks.  He has a history of coronary bypass about 12 or more years ago, reports today he started feeling sweaty during some of the chest discomfort.  There is no ripping tearing or moving pain.  The only thing that seemed to make it somewhat worse was the shortness of breath he experienced while trying to take a walk   baby aspirin daily.  Currently reports a moderate discomfort or soreness across the front of the chest, but denies anything that would have injured his chest  Past Medical History:  Diagnosis Date  . Arthritis   . Coronary artery disease   . Diabetes mellitus without complication ([place])   . MI, old     Patient Active Problem List   Diagnosis Date Noted  . Chest pain 11/16/2017    Past Surgical History:  Procedure Laterality Date  . CARDIAC SURGERY      Prior to Admission medications   Medication Sig Start Date End Date Taking? Authorizing Provider  aspirin 81 MG tablet Take 81 mg by mouth daily.    [provider]  lisinopril (PRINIVIL,ZESTRIL) 10 MG tablet Take 10 mg by mouth daily.    [provider]  metFORMIN (GLUCOPHAGE) 500 MG tablet Take 500 mg by mouth 2 (two) times daily with a meal.    [provider]  metoprolol succinate (TOPROL-XL) 25 MG 24 hr tablet  Take 25 mg by mouth daily.    [provider]  naproxen (NAPROSYN) 500 MG tablet Take 1 tablet (500 mg total) by mouth 2 (two) times daily with a meal. 07/30/16   Sable Feil, PA-C    Allergies Patient has no known allergies.  No family history on file.  Social History Social History   Tobacco Use  . Smoking status: Former Smoker    Types: Cigarettes  . Smokeless tobacco: Never Used  Substance Use Topics  . Alcohol use: No  . Drug use: No    Review of Systems Constitutional: No fever/chills Eyes: No visual changes. ENT: No sore throat. Cardiovascular: See HPI respiratory: See HPI gastrointestinal: No abdominal pain.  No nausea, no vomiting.  No diarrhea.  No constipation. Genitourinary: Negative for dysuria. Musculoskeletal: Negative for back pain. Skin: Negative for rash.  Little sweaty earlier today Neurological: Negative for headaches, focal weakness or numbness.    ____________________________________________   PHYSICAL EXAM:  VITAL SIGNS: ED Triage Vitals  Enc Vitals Group     BP 11/16/17 1112 (!) 135/91     Pulse Rate 11/16/17 1112 69     Resp 11/16/17 1112 18     Temp --      Temp src --      SpO2 11/16/17 1112 100 %     Weight 11/16/17 1119 260 lb (117.9 kg)     Height  11/16/17 1119 5\' 9"  (1.753 m)     Head Circumference --      Peak Flow --      Pain Score 11/16/17 1119 7     Pain Loc --      Pain Edu? --      Excl. in [place]? --     Constitutional: Alert and oriented. Well appearing and in no acute distress. Eyes: Conjunctivae are normal. Head: Atraumatic. Nose: No congestion/rhinnorhea. Mouth/Throat: Mucous membranes are moist. Neck: No stridor.   Cardiovascular: Normal rate, regular rhythm. Grossly normal heart sounds.  Good peripheral circulation. Respiratory: Normal respiratory effort.  No retractions. Lungs CTAB. Gastrointestinal: Soft and nontender. No distention. Musculoskeletal: No lower extremity tenderness nor  edema. Neurologic:  Normal speech and language. No gross focal neurologic deficits are appreciated.  Skin:  Skin is warm, slightly diaphoretic and intact. No rash noted. Psychiatric: Mood and affect are normal.  Denies suicidal ideation, but does tell me he has been depressed recently and sometimes feels as though the world would be better off without him.  No thoughts of killing himself.  Denies any desire to harm self.  ____________________________________________   LABS (all labs ordered are listed, but only abnormal results are displayed)  Labs Reviewed  BASIC METABOLIC PANEL - Abnormal; Notable for the following components:      Result Value   Sodium 134 (*)    Glucose, Bld 139 (*)    Creatinine, Ser 0.53 (*)    All other components within normal limits  CBC  TROPONIN I  TROPONIN I  TROPONIN I  TROPONIN I   ____________________________________________  EKG  Reviewed and interpreted by me at 1120 Heart rate 70 QRS 90 QTC 420 Normal sinus rhythm, evidence of possible old inferior MI, no acute ischemic changes noted.  No ST segment elevation ____________________________________________  RADIOLOGY  Dg Chest 2 View  Result Date: 11/16/2017 CLINICAL DATA:  Bilateral arm pain and chest pain EXAM: CHEST  2 VIEW COMPARISON:  08/18/2017 FINDINGS: Cardiac shadows within normal limits. Postsurgical changes are again seen and stable. The lungs are well aerated bilaterally. No focal infiltrate or sizable effusion is seen. Mild degenerative changes of thoracic spine are noted. IMPRESSION: No acute abnormality noted. Electronically Signed   By: Inez Catalina M.D.   On: 11/16/2017 11:55    Chest x-ray result reviewed, no acute ____________________________________________   PROCEDURES  Procedure(s) performed: None  Procedures  Critical Care performed: No  ____________________________________________   INITIAL IMPRESSION / ASSESSMENT AND PLAN / ED COURSE  Pertinent labs &  imaging results that were available during my care of the patient were reviewed by me and considered in my medical decision making (see chart for details).  Patient presents for evaluation of 2 days of chest discomfort.  Slightly atypical in some respect, but has a strong history of coronary disease.  At present his EKG and first troponin are reassuring.  His symptoms however, including the worsening experience with exertion yesterday is concerning for possibly developing angina.  His troponin is negative, his EKG does not demonstrate ischemic abnormality acutely thus I do not feel he needs to be heparinized in addition he reports relief of pain after 3 nitroglycerin and morphine.  Remains suspicious for possible acute coronary syndrome.  No ripping tearing or moving pain, no pleuritic symptoms to suggest PE.  Doubt dissection.  ----------------------------------------- 12:52 PM on 11/16/2017 -----------------------------------------  Patient will be admitted.  Patient agreeable with plan.  Currently reports pain is significantly relieved  after medications.      ____________________________________________   FINAL CLINICAL IMPRESSION(S) / ED DIAGNOSES  Final diagnoses:  Moderate risk chest pain      NEW MEDICATIONS STARTED DURING THIS VISIT:  This SmartLink is deprecated. Use AVSMEDLIST instead to display the medication list for a patient.   Note:  This document was prepared using Dragon voice recognition software and may include unintentional dictation errors.     Delman Kitten, MD 11/16/17 1253

## 2017-11-16 NOTE — Plan of Care (Signed)
Oriented to room features and unit routines.

## 2017-11-17 ENCOUNTER — Observation Stay (HOSPITAL_BASED_OUTPATIENT_CLINIC_OR_DEPARTMENT_OTHER): Payer: Medicare Other

## 2017-11-17 DIAGNOSIS — R079 Chest pain, unspecified: Secondary | ICD-10-CM

## 2017-11-17 DIAGNOSIS — I10 Essential (primary) hypertension: Secondary | ICD-10-CM | POA: Diagnosis not present

## 2017-11-17 DIAGNOSIS — I25118 Atherosclerotic heart disease of native coronary artery with other forms of angina pectoris: Secondary | ICD-10-CM

## 2017-11-17 DIAGNOSIS — R0789 Other chest pain: Secondary | ICD-10-CM | POA: Diagnosis not present

## 2017-11-17 DIAGNOSIS — F430 Acute stress reaction: Secondary | ICD-10-CM | POA: Diagnosis not present

## 2017-11-17 LAB — NM MYOCAR MULTI W/SPECT W/WALL MOTION / EF
Estimated workload: 1 METS
Exercise duration (min): 1 min
Exercise duration (sec): 30 s
LV dias vol: 113 mL (ref 62–150)
LV sys vol: 53 mL
Peak HR: 81 {beats}/min
Rest HR: 60 {beats}/min
SDS: 7
SRS: 9
SSS: 9
TID: 1.12

## 2017-11-17 LAB — LIPID PANEL
Cholesterol: 199 mg/dL (ref 0–200)
HDL: 33 mg/dL — ABNORMAL LOW (ref >40)
LDL Cholesterol: UNDETERMINED mg/dL (ref 0–99)
Total CHOL/HDL Ratio: 6 RATIO
Triglycerides: 483 mg/dL — ABNORMAL HIGH (ref <150)
VLDL: UNDETERMINED mg/dL (ref 0–40)

## 2017-11-17 LAB — GLUCOSE, CAPILLARY: Glucose-Capillary: 226 mg/dL — ABNORMAL HIGH (ref 65–99)

## 2017-11-17 MED ORDER — TECHNETIUM TC 99M TETROFOSMIN IV KIT
13.0000 | PACK | Freq: Once | INTRAVENOUS | Status: AC | PRN
  Administered 2017-11-17: 13.454 via INTRAVENOUS

## 2017-11-17 MED ORDER — LORAZEPAM 2 MG/ML IJ SOLN
0.5000 mg | Freq: Once | INTRAMUSCULAR | Status: AC
  Administered 2017-11-17: 0.5 mg via INTRAVENOUS
  Filled 2017-11-17: qty 1

## 2017-11-17 MED ORDER — TECHNETIUM TC 99M TETROFOSMIN IV KIT
30.0000 | PACK | Freq: Once | INTRAVENOUS | Status: AC | PRN
  Administered 2017-11-17: 35.367 via INTRAVENOUS

## 2017-11-17 MED ORDER — ROSUVASTATIN CALCIUM 10 MG PO TABS
10.0000 mg | ORAL_TABLET | Freq: Every day | ORAL | Status: DC
  Filled 2017-11-17: qty 1

## 2017-11-17 MED ORDER — NITROGLYCERIN 0.4 MG SL SUBL: 0.4000 mg | SUBLINGUAL_TABLET | SUBLINGUAL | 0 refills | Status: DC | PRN

## 2017-11-17 MED ORDER — ROSUVASTATIN CALCIUM 10 MG PO TABS: 10.0000 mg | ORAL_TABLET | Freq: Every day | ORAL | 0 refills | Status: DC

## 2017-11-17 MED ORDER — INSULIN ASPART 100 UNIT/ML ~~LOC~~ SOLN
0.0000 [IU] | Freq: Three times a day (TID) | SUBCUTANEOUS | Status: DC
  Administered 2017-11-17: 3 [IU] via SUBCUTANEOUS
  Filled 2017-11-17: qty 1

## 2017-11-17 MED ORDER — REGADENOSON 0.4 MG/5ML IV SOLN
0.4000 mg | Freq: Once | INTRAVENOUS | Status: AC
  Administered 2017-11-17: 0.4 mg via INTRAVENOUS
  Filled 2017-11-17: qty 5

## 2017-11-17 NOTE — Progress Notes (Signed)
Myovue shows no definite ischemia  Possible inferior scar vs soft tissue attenuation  LVEF 31%    The LVEF was difficult to calculate due to overlying bowel activity  Recomm outpt echo to define  It appears to be greater  Increased tracer activity seen in thyroid region of neck  Recomm USN of neck (as outpatient )  Pt OK for D/C from cardiac standpoint.  Will make sure he has follow up.

## 2017-11-17 NOTE — Progress Notes (Signed)
[place] [facility] [place] NAME: Derrick Gallegos    MR#:  [id number]  DATE OF BIRTH:  August 01, 1951  SUBJECTIVE:   Patient has not reported chest pain overnight  REVIEW OF SYSTEMS:    Review of Systems  Constitutional: Negative for fever, chills weight loss HENT: Negative for ear pain, nosebleeds, congestion, facial swelling, rhinorrhea, neck pain, neck stiffness and ear discharge.   Respiratory: Negative for cough, shortness of breath, wheezing  Cardiovascular: Negative for chest pain, palpitations and leg swelling.  Gastrointestinal: Negative for heartburn, abdominal pain, vomiting, diarrhea or consitpation Genitourinary: Negative for dysuria, urgency, frequency, hematuria Musculoskeletal: Negative for back pain or joint pain Neurological: Negative for dizziness, seizures, syncope, focal weakness,  numbness and headaches.  Hematological: Does not bruise/bleed easily.  Psychiatric/Behavioral: Negative for hallucinations, confusion, dysphoric mood    Tolerating Diet: Nothing by mouth      DRUG ALLERGIES:  No Known Allergies  VITALS:  Blood pressure 107/77, pulse (!) 57, temperature 97.7 F (36.5 C), temperature source Oral, resp. rate 12, height 5\' 11"  (1.803 m), weight 117.8 kg (259 lb 9.6 oz), SpO2 98 %.  PHYSICAL EXAMINATION:  Constitutional: Appears well-developed and well-nourished. No distress. HENT: Normocephalic. Marland Kitchen Oropharynx is clear and moist.  Eyes: Conjunctivae and EOM are normal. PERRLA, no scleral icterus.  Neck: Normal ROM. Neck supple. No JVD. No tracheal deviation. CVS: RRR, S1/S2 +, no murmurs, no gallops, no carotid bruit.  Pulmonary: Effort and breath sounds normal, no stridor, rhonchi, wheezes, rales.  Abdominal: Soft. BS +,  no distension, tenderness, rebound or guarding.  Musculoskeletal: Normal range of motion. No edema and no tenderness.  Neuro: Alert. CN 2-12 grossly intact. No focal deficits. Skin: Skin is warm  and dry. No rash noted. Psychiatric: Normal mood and affect.      LABORATORY PANEL:   CBC Recent Labs  Lab 11/16/17 1122  WBC 7.5  HGB 15.5  HCT 43.9  PLT 318   ------------------------------------------------------------------------------------------------------------------  Chemistries  Recent Labs  Lab 11/16/17 1122  NA 134*  K 4.2  CL 101  CO2 24  GLUCOSE 139*  BUN 15  CREATININE 0.53*  CALCIUM 9.4   ------------------------------------------------------------------------------------------------------------------  Cardiac Enzymes Recent Labs  Lab 11/16/17 1315 11/16/17 1701 11/16/17 2058  TROPONINI <0.03 <0.03 <0.03   ------------------------------------------------------------------------------------------------------------------  RADIOLOGY:  Dg Chest 2 View  Result Date: 11/16/2017 CLINICAL DATA:  Bilateral arm pain and chest pain EXAM: CHEST  2 VIEW COMPARISON:  08/18/2017 FINDINGS: Cardiac shadows within normal limits. Postsurgical changes are again seen and stable. The lungs are well aerated bilaterally. No focal infiltrate or sizable effusion is seen. Mild degenerative changes of thoracic spine are noted. IMPRESSION: No acute abnormality noted. Electronically Signed   By: Inez Catalina M.D.   On: 11/16/2017 11:55     ASSESSMENT AND PLAN:    66 year old male with history of CAD status post CABG who presents with chest pain.   1. Chest pain: Patient has ruled out for ACS. He will undergo stress testing today. Cardiology consultation was requested and appreciated.  2. CAD status post CABG: Continue aspirin, metoprolol and Crestor  3. Essential hypertension: Continue lisinopril and metoprolol  4. Diabetes: Continue sliding scale.      Management plans discussed with the patient and he is in agreement.  CODE STATUS: full  TOTAL TIME TAKING CARE OF THIS PATIENT: 30 minutes.     POSSIBLE D/C today, DEPENDING ON stress test  results  Carli Lefevers M.D on  11/17/2017 at 8:08 AM  Between 7am to 6pm - Pager - [phone] After 6pm go to [email or web address] - password [email or web address] [facility]  Office  [phone]  CC: Primary care physician; [facility], [place]  Note: This dictation was prepared with Diplomatic Services operational officer dictation along with smaller phrase technology. Any transcriptional errors that result from this process are unintentional.

## 2017-11-17 NOTE — Progress Notes (Signed)
Pt discharged to home. Prescriptions and discharge instructions/education provided and reviewed. IV & heart monitor removed. Pt ambulatory at time of d/c.

## 2017-11-17 NOTE — Discharge Summary (Signed)
[facility] NAME: Derrick Gallegos    MR#:  [id number]  DATE OF BIRTH:  12/14/1951  DATE OF ADMISSION:  11/16/2017 ADMITTING PHYSICIAN: Max Sane, MD  DATE OF DISCHARGE: 11/17/2017  PRIMARY CARE PHYSICIAN: [facility]    ADMISSION DIAGNOSIS:  Moderate risk chest pain [R07.9]  DISCHARGE DIAGNOSIS:  Active Problems:   Chest pain   Coronary artery disease   Diabetes mellitus with complication [facility])   Essential hypertension   Family history of premature CAD   HLD (hyperlipidemia)   Obesity   SECONDARY DIAGNOSIS:   Past Medical History:  Diagnosis Date  . Anxiety   . Arthritis   . Coronary artery disease    a. s/p 3-v cabg in 2004 at [place] (LIMA and radial arery used - no further details in [place])  . Depression   . Diabetes mellitus with complication ([place])   . Essential hypertension   . Family history of premature CAD    a. father passed away from an MI at 58  . HLD (hyperlipidemia)    a. statin intolerant   . Obesity     HOSPITAL COURSE:   66 year old male with history of CAD status post CABG who presents with chest pain.   1. Chest pain: Patient has ruled out for ACS. He was evaluated by cardiology with recommendations for cardiac stress testing. Chronic stress test results show low risk for CAD   2. CAD status post CABG: Continue aspirin, metoprolol. He is intolerant to statins and prefers no to take them.   3. Essential hypertension: Continue lisinopril and metoprolol  4. Diabetes: Continue outpatient medications with ADA diet.    DISCHARGE CONDITIONS AND DIET:   Stable for discharge on cardiac heart healthy diet/diabetic  CONSULTS OBTAINED:  Treatment Team:  Minna Merritts, MD  DRUG ALLERGIES:  No Known Allergies  DISCHARGE MEDICATIONS:   Current Discharge Medication List    START taking these medications   Details  nitroGLYCERIN (NITROSTAT) 0.4 MG  SL tablet Place 1 tablet (0.4 mg total) under the tongue every 5 (five) minutes as needed for chest pain. Qty: 30 tablet, Refills: 0      CONTINUE these medications which have NOT CHANGED   Details  aspirin 81 MG tablet Take 81 mg by mouth daily.    glipiZIDE (GLUCOTROL) 5 MG tablet Take 5 mg by mouth at bedtime.    lisinopril (PRINIVIL,ZESTRIL) 20 MG tablet Take 10 mg by mouth daily.    metFORMIN (GLUCOPHAGE) 500 MG tablet Take 1,000 mg by mouth at bedtime.     metoprolol succinate (TOPROL-XL) 25 MG 24 hr tablet Take 25 mg by mouth daily.    Omega-3 Fatty Acids (FISH OIL) 1200 MG CAPS Take 2,400 mg by mouth at bedtime.    naproxen (NAPROSYN) 500 MG tablet Take 1 tablet (500 mg total) by mouth 2 (two) times daily with a meal. Qty: 20 tablet, Refills: 00          Today   CHIEF COMPLAINT:   No chest pain overnight   VITAL SIGNS:  Blood pressure 117/75, pulse (!) 56, temperature 97.7 F (36.5 C), temperature source Oral, resp. rate 16, height 5\' 11"  (1.803 m), weight 117.8 kg (259 lb 9.6 oz), SpO2 97 %.   REVIEW OF SYSTEMS:  Review of Systems  Constitutional: Negative.  Negative for chills, fever and malaise/fatigue.  HENT: Negative.  Negative for ear discharge, ear pain, hearing loss, nosebleeds  and sore throat.   Eyes: Negative.  Negative for blurred vision and pain.  Respiratory: Negative.  Negative for cough, hemoptysis, shortness of breath and wheezing.   Cardiovascular: Negative.  Negative for chest pain, palpitations and leg swelling.  Gastrointestinal: Negative.  Negative for abdominal pain, blood in stool, diarrhea, nausea and vomiting.  Genitourinary: Negative.  Negative for dysuria.  Musculoskeletal: Negative.  Negative for back pain.  Skin: Negative.   Neurological: Negative for dizziness, tremors, speech change, focal weakness, seizures and headaches.  Endo/Heme/Allergies: Negative.  Does not bruise/bleed easily.  Psychiatric/Behavioral: Negative.   Negative for depression, hallucinations and suicidal ideas.     PHYSICAL EXAMINATION:  GENERAL:  66 y.o.-year-old patient lying in the bed with no acute distress.  NECK:  Supple, no jugular venous distention. No thyroid enlargement, no tenderness.  LUNGS: Normal breath sounds bilaterally, no wheezing, rales,rhonchi  No use of accessory muscles of respiration.  CARDIOVASCULAR: S1, S2 normal. No murmurs, rubs, or gallops.  ABDOMEN: Soft, non-tender, non-distended. Bowel sounds present. No organomegaly or mass.  EXTREMITIES: No pedal edema, cyanosis, or clubbing.  PSYCHIATRIC: The patient is alert and oriented x 3.  SKIN: No obvious rash, lesion, or ulcer.   DATA REVIEW:   CBC Recent Labs  Lab 11/16/17 1122  WBC 7.5  HGB 15.5  HCT 43.9  PLT 318    Chemistries  Recent Labs  Lab 11/16/17 1122  NA 134*  K 4.2  CL 101  CO2 24  GLUCOSE 139*  BUN 15  CREATININE 0.53*  CALCIUM 9.4    Cardiac Enzymes Recent Labs  Lab 11/16/17 1315 11/16/17 1701 11/16/17 2058  TROPONINI <0.03 <0.03 <0.03    Microbiology Results  @[email or web address] @  RADIOLOGY:  Dg Chest 2 View  Result Date: 11/16/2017 CLINICAL DATA:  Bilateral arm pain and chest pain EXAM: CHEST  2 VIEW COMPARISON:  08/18/2017 FINDINGS: Cardiac shadows within normal limits. Postsurgical changes are again seen and stable. The lungs are well aerated bilaterally. No focal infiltrate or sizable effusion is seen. Mild degenerative changes of thoracic spine are noted. IMPRESSION: No acute abnormality noted. Electronically Signed   By: Inez Catalina M.D.   On: 11/16/2017 11:55      Current Discharge Medication List    START taking these medications   Details  nitroGLYCERIN (NITROSTAT) 0.4 MG SL tablet Place 1 tablet (0.4 mg total) under the tongue every 5 (five) minutes as needed for chest pain. Qty: 30 tablet, Refills: 0      CONTINUE these medications which have NOT CHANGED   Details  aspirin 81 MG tablet Take 81 mg by  mouth daily.    glipiZIDE (GLUCOTROL) 5 MG tablet Take 5 mg by mouth at bedtime.    lisinopril (PRINIVIL,ZESTRIL) 20 MG tablet Take 10 mg by mouth daily.    metFORMIN (GLUCOPHAGE) 500 MG tablet Take 1,000 mg by mouth at bedtime.     metoprolol succinate (TOPROL-XL) 25 MG 24 hr tablet Take 25 mg by mouth daily.    Omega-3 Fatty Acids (FISH OIL) 1200 MG CAPS Take 2,400 mg by mouth at bedtime.    naproxen (NAPROSYN) 500 MG tablet Take 1 tablet (500 mg total) by mouth 2 (two) times daily with a meal. Qty: 20 tablet, Refills: 00            Management plans discussed with the patient and he is in agreement. Stable for discharge   Patient should follow up with [facility]  CODE STATUS:     Code Status  Orders  (From admission, onward)        Start     Ordered   11/16/17 1346  Full code  Continuous     11/16/17 1345    Code Status History    Date Active Date Inactive Code Status Order ID Comments User Context   This patient has a current code status but no historical code status.      TOTAL TIME TAKING CARE OF THIS PATIENT: 37 minutes.    Note: This dictation was prepared with Dragon dictation along with smaller phrase technology. Any transcriptional errors that result from this process are unintentional.  Emani Morad M.D on 11/17/2017 at 10:26 AM  Between 7am to 6pm - Pager - [phone] After 6pm go to [email or web address] - password [email or web address] [facility]  Office  [phone]  CC: Primary care physician; [facility]

## 2017-11-17 NOTE — Progress Notes (Signed)
Progress Note  Patient Name: Derrick Gallegos Date of Encounter: 11/17/2017  Primary Cardiologist: New  Subjective   No SOB  Minimal chest tightness.   Panic spell earlier in rest portion of nuclar scan  Inpatient Medications    Scheduled Meds: . aspirin EC  81 mg Oral Daily  . enoxaparin (LOVENOX) injection  40 mg Subcutaneous Q24H  . insulin aspart  0-9 Units Subcutaneous TID WC  . lisinopril  10 mg Oral Daily  . LORazepam  0.5 mg Intravenous Once  . metoprolol succinate  25 mg Oral Daily  . naproxen  500 mg Oral BID WC  . regadenoson  0.4 mg Intravenous Once  . rosuvastatin  10 mg Oral Daily   Continuous Infusions:  PRN Meds: acetaminophen, ALPRAZolam, gi cocktail, morphine injection, nitroGLYCERIN, ondansetron (ZOFRAN) IV, technetium tetrofosmin   Vital Signs    Vitals:   11/16/17 1347 11/16/17 1923 11/17/17 0407 11/17/17 0744  BP: 124/73 115/64 119/82 107/77  Pulse: 63 (!) 53 (!) 54 (!) 57  Resp: 18 17  12   Temp: 98.2 F (36.8 C) 98.2 F (36.8 C) 97.7 F (36.5 C)   TempSrc: Oral Oral Oral   SpO2: 100% 97% 97% 98%  Weight: 259 lb 9.6 oz (117.8 kg)     Height: 5\' 11"  (1.803 m)       Intake/Output Summary (Last 24 hours) at 11/17/2017 0916 Last data filed at 11/17/2017 0045 Gross per 24 hour  Intake -  Output 0 ml  Net 0 ml   Filed Weights   11/16/17 1119 11/16/17 1347  Weight: 260 lb (117.9 kg) 259 lb 9.6 oz (117.8 kg)    Telemetry    SR - Personally Reviewed  ECG      Physical Exam   GEN: No acute distress.   Neck: No JVD Cardiac: RRR, no murmurs, rubs, or gallops.  Respiratory: Clear to auscultation bilaterally. GI: Soft, nontender, non-distended  MS: No edema; No deformity. Neuro:  Nonfocal  Psych: Normal affect   Labs    Chemistry Recent Labs  Lab 11/16/17 1122  NA 134*  K 4.2  CL 101  CO2 24  GLUCOSE 139*  BUN 15  CREATININE 0.53*  CALCIUM 9.4  GFRNONAA >60  GFRAA >60  ANIONGAP 9     Hematology Recent Labs  Lab  11/16/17 1122  WBC 7.5  RBC 4.77  HGB 15.5  HCT 43.9  MCV 92.0  MCH 32.6  MCHC 35.4  RDW 13.0  PLT 318    Cardiac Enzymes Recent Labs  Lab 11/16/17 1122 11/16/17 1315 11/16/17 1701 11/16/17 2058  TROPONINI <0.03 <0.03 <0.03 <0.03   No results for input(s): TROPIPOC in the last 168 hours.   BNPNo results for input(s): BNP, PROBNP in the last 168 hours.   DDimer No results for input(s): DDIMER in the last 168 hours.   Radiology    Dg Chest 2 View  Result Date: 11/16/2017 CLINICAL DATA:  Bilateral arm pain and chest pain EXAM: CHEST  2 VIEW COMPARISON:  08/18/2017 FINDINGS: Cardiac shadows within normal limits. Postsurgical changes are again seen and stable. The lungs are well aerated bilaterally. No focal infiltrate or sizable effusion is seen. Mild degenerative changes of thoracic spine are noted. IMPRESSION: No acute abnormality noted. Electronically Signed   By: Inez Catalina M.D.   On: 11/16/2017 11:55    Cardiac Studies     Patient Profile     66 y.o. male known CAD and CABG, DM2 HTN, HL,  with CP    Assessment & Plan    1  Chest pain  / Hx of CAD and CABG   Myovue today  Pt very anxious  Had panic attack during intial part of scan  Ativan given for second set of images  Results pending   2  HTN  BP controlled    Further work up based on results    For questions or updates, please contact [place] Please consult [email or web address] for contact info under Cardiology/STEMI.      Signed, Dorris Carnes, MD  11/17/2017, 9:16 AM

## 2017-11-18 LAB — HEMOGLOBIN A1C
Hgb A1c MFr Bld: 6.4 % — ABNORMAL HIGH (ref 4.8–5.6)
Mean Plasma Glucose: 137 mg/dL

## 2017-12-25 HISTORY — PX: CARDIAC CATHETERIZATION: SHX172

## 2018-05-18 ENCOUNTER — Emergency Department: Payer: Medicare Other

## 2018-05-18 ENCOUNTER — Emergency Department
Admission: EM | Admit: 2018-05-18 | Discharge: 2018-05-18 | Disposition: A | Discharge status: Home / Self Care | Payer: Medicare Other | Attending: Emergency Medicine | Admitting: Emergency Medicine

## 2018-05-18 ENCOUNTER — Encounter: Payer: Self-pay | Admitting: Emergency Medicine

## 2018-05-18 ENCOUNTER — Other Ambulatory Visit: Payer: Self-pay

## 2018-05-18 DIAGNOSIS — Z951 Presence of aortocoronary bypass graft: Secondary | ICD-10-CM | POA: Diagnosis not present

## 2018-05-18 DIAGNOSIS — I4892 Unspecified atrial flutter: Secondary | ICD-10-CM | POA: Insufficient documentation

## 2018-05-18 DIAGNOSIS — Z7982 Long term (current) use of aspirin: Secondary | ICD-10-CM | POA: Diagnosis not present

## 2018-05-18 DIAGNOSIS — Z79899 Other long term (current) drug therapy: Secondary | ICD-10-CM | POA: Diagnosis not present

## 2018-05-18 DIAGNOSIS — Z87891 Personal history of nicotine dependence: Secondary | ICD-10-CM | POA: Insufficient documentation

## 2018-05-18 DIAGNOSIS — E119 Type 2 diabetes mellitus without complications: Secondary | ICD-10-CM | POA: Insufficient documentation

## 2018-05-18 DIAGNOSIS — R079 Chest pain, unspecified: Secondary | ICD-10-CM | POA: Diagnosis present

## 2018-05-18 DIAGNOSIS — I10 Essential (primary) hypertension: Secondary | ICD-10-CM | POA: Insufficient documentation

## 2018-05-18 DIAGNOSIS — I25810 Atherosclerosis of coronary artery bypass graft(s) without angina pectoris: Secondary | ICD-10-CM | POA: Insufficient documentation

## 2018-05-18 DIAGNOSIS — Z7984 Long term (current) use of oral hypoglycemic drugs: Secondary | ICD-10-CM | POA: Insufficient documentation

## 2018-05-18 LAB — CBC
HCT: 38.6 % — ABNORMAL LOW (ref 40.0–52.0)
Hemoglobin: 13.8 g/dL (ref 13.0–18.0)
MCH: 33.1 pg (ref 26.0–34.0)
MCHC: 35.7 g/dL (ref 32.0–36.0)
MCV: 92.6 fL (ref 80.0–100.0)
Platelets: 310 10*3/uL (ref 150–440)
RBC: 4.17 MIL/uL — ABNORMAL LOW (ref 4.40–5.90)
RDW: 13.5 % (ref 11.5–14.5)
WBC: 8.2 10*3/uL (ref 3.8–10.6)

## 2018-05-18 LAB — COMPREHENSIVE METABOLIC PANEL
ALT: 18 U/L (ref 17–63)
AST: 26 U/L (ref 15–41)
Albumin: 4 g/dL (ref 3.5–5.0)
Alkaline Phosphatase: 62 U/L (ref 38–126)
Anion gap: 6 (ref 5–15)
BUN: 20 mg/dL (ref 6–20)
CO2: 25 mmol/L (ref 22–32)
Calcium: 8.8 mg/dL — ABNORMAL LOW (ref 8.9–10.3)
Chloride: 103 mmol/L (ref 101–111)
Creatinine, Ser: 0.98 mg/dL (ref 0.61–1.24)
GFR calc Af Amer: >60 mL/min (ref >60)
GFR calc non Af Amer: >60 mL/min (ref >60)
Glucose, Bld: 259 mg/dL — ABNORMAL HIGH (ref 65–99)
Potassium: 3.9 mmol/L (ref 3.5–5.1)
Sodium: 134 mmol/L — ABNORMAL LOW (ref 135–145)
Total Bilirubin: 0.6 mg/dL (ref 0.3–1.2)
Total Protein: 7.5 g/dL (ref 6.5–8.1)

## 2018-05-18 LAB — TROPONIN I: Troponin I: <0.03 ng/mL (ref <0.03)

## 2018-05-18 MED ORDER — METOPROLOL SUCCINATE ER 50 MG PO TB24
25.0000 mg | ORAL_TABLET | Freq: Every day | ORAL | Status: DC
  Administered 2018-05-18: 25 mg via ORAL
  Filled 2018-05-18: qty 1

## 2018-05-18 MED ORDER — SODIUM CHLORIDE 0.9 % IV BOLUS
1000.0000 mL | Freq: Once | INTRAVENOUS | Status: AC
  Administered 2018-05-18: 1000 mL via INTRAVENOUS

## 2018-05-18 MED ORDER — METOPROLOL SUCCINATE ER 25 MG PO TB24: 25.0000 mg | ORAL_TABLET | Freq: Two times a day (BID) | ORAL | 0 refills | Status: DC

## 2018-05-18 NOTE — ED Triage Notes (Addendum)
Pt presents to ER via EMS, per EMS pt has been experiencing chest tightness for the past 4 days. Pt history of quadruple bypass, reports pain increases when he walks and mild shortness of breath. EMS administered pt 2.5mg  of Metoprolol, pt EKG A.Flutter HR high as 140 after med administration of med HR 85-95 pt reports tightness feeling better. Pt is awake alert and oriented

## 2018-05-18 NOTE — ED Notes (Signed)
Pt reports he has not been able to void as he usually does, pt's daughter reports she administered 40mg  of lasix PO last nigh reports pt had an output of approximately 2 gallons pt reports today he drank 2 bottles of water 16 ounces and 2 glasses of tea and has not used the bathroom

## 2018-05-18 NOTE — Discharge Instructions (Addendum)
Please call the number to arrange a follow-up appointment with cardiology but call the number soon as possible.  Return to the emergency department for any return of chest pain, worsening shortness of breath, or any other symptom personally concerning to yourself.

## 2018-05-18 NOTE — ED Provider Notes (Signed)
[facility] Emergency Department Provider Note  Time seen: 4:04 PM  I have reviewed the triage vital signs and the nursing notes.   HISTORY  Chief Complaint Chest Pain and Shortness of Breath    HPI Derrick Gallegos is a 67 y.o. male with a past medical history of anxiety, CAD status post bypass in 2004, diabetes, hypertension, hyperlipidemia, presents to the emergency department with chest pressure shortness of breath and rapid heartbeat.  According to the patient for the past 4 days he has been experiencing tightness and pressure in his chest, states he has been feeling his heart beat very fast at times.  Also states he is intermittently become short of breath especially when walking or exerting himself.  Patient finally came by EMS because his sisters wanted him to get evaluated.  Denies any worsening of symptoms today.  EMS found the patient to be in atrial flutter with a heart rate around 140 bpm and dose 2.5 mg of IV metoprolol brought the patient to the emergency department.  Upon arrival patient is in atrial flutter with a ventricular rate around 90 bpm.  Patient denies any history of atrial fibrillation or flutter in the past.   Past Medical History:  Diagnosis Date  . Anxiety   . Arthritis   . Coronary artery disease    a. s/p 3-v cabg in 2004 at [place] (LIMA and radial arery used - no further details in [place])  . Depression   . Diabetes mellitus with complication ([place])   . Essential hypertension   . Family history of premature CAD    a. father passed away from an MI at 24  . HLD (hyperlipidemia)    a. statin intolerant   . Obesity     Patient Active Problem List   Diagnosis Date Noted  . Chest pain 11/16/2017  . Coronary artery disease   . Diabetes mellitus with complication ([place])   . Essential hypertension   . Family history of premature CAD   . HLD (hyperlipidemia)   . Obesity     Past Surgical History:  Procedure Laterality Date   . CARDIAC SURGERY      Prior to Admission medications   Medication Sig Start Date End Date Taking? Authorizing Provider  aspirin 81 MG tablet Take 81 mg by mouth daily.    [provider]  glipiZIDE (GLUCOTROL) 5 MG tablet Take 5 mg by mouth at bedtime.    [provider]  lisinopril (PRINIVIL,ZESTRIL) 20 MG tablet Take 10 mg by mouth daily.    [provider]  metFORMIN (GLUCOPHAGE) 500 MG tablet Take 1,000 mg by mouth at bedtime.     [provider]  metoprolol succinate (TOPROL-XL) 25 MG 24 hr tablet Take 25 mg by mouth daily.    [provider]  naproxen (NAPROSYN) 500 MG tablet Take 1 tablet (500 mg total) by mouth 2 (two) times daily with a meal. Patient not taking: Reported on 11/16/2017 07/30/16   Sable Feil, PA-C  nitroGLYCERIN (NITROSTAT) 0.4 MG SL tablet Place 1 tablet (0.4 mg total) under the tongue every 5 (five) minutes as needed for chest pain. 11/17/17   Bettey Costa, MD  Omega-3 Fatty Acids (FISH OIL) 1200 MG CAPS Take 2,400 mg by mouth at bedtime.    [provider]    No Known Allergies  Family History  Problem Relation Age of Onset  . CAD Mother   . CAD Father  a. MI at 57-->death  . CAD Brother     Social History Social History   Tobacco Use  . Smoking status: Former Smoker    Types: Cigarettes  . Smokeless tobacco: Never Used  Substance Use Topics  . Alcohol use: No  . Drug use: No    Review of Systems Constitutional: Negative for fever. Eyes: Negative for visual complaints ENT: Negative for recent illness/congestion Cardiovascular: Chest pressure/tightness. Respiratory: Shortness of breath with exertion x4 days Gastrointestinal: Negative for abdominal pain, vomiting  Genitourinary: Negative for urinary compaints Musculoskeletal: Negative for leg pain or swelling Skin: Negative for skin complaints  Neurological: Negative for headache All other ROS  negative  ____________________________________________   PHYSICAL EXAM:  VITAL SIGNS: ED Triage Vitals  Enc Vitals Group     BP 05/18/18 1558 126/78     Pulse Rate 05/18/18 1558 77     Resp 05/18/18 1558 17     Temp 05/18/18 1558 98.7 F (37.1 C)     Temp Source 05/18/18 1558 Oral     SpO2 05/18/18 1558 96 %     Weight 05/18/18 1600 260 lb (117.9 kg)     Height 05/18/18 1600 5\' 11"  (1.803 m)     Head Circumference --      Peak Flow --      Pain Score 05/18/18 1558 0     Pain Loc --      Pain Edu? --      Excl. in [place]? --    Constitutional: Alert and oriented. Well appearing and in no distress. Eyes: Normal exam ENT   Head: Normocephalic and atraumatic.   Mouth/Throat: Mucous membranes are moist. Cardiovascular: Irregular rhythm, rate around 90 bpm. Respiratory: Normal respiratory effort without tachypnea nor retractions. Breath sounds are clear  Gastrointestinal: Soft and nontender. No distention.   Musculoskeletal: Nontender with normal range of motion in all extremities. No lower extremity tenderness or edema. Neurologic:  Normal speech and language. No gross focal neurologic deficits Skin:  Skin is warm, dry and intact.  Psychiatric: Mood and affect are normal.   ____________________________________________    EKG  EKG reviewed and interpreted by myself shows atrial flutter at 83 bpm with a narrow QRS, normal axis, largely normal intervals, nonspecific ST changes.  ____________________________________________    RADIOLOGY  X-ray shows no edema or consolidation.  ____________________________________________   INITIAL IMPRESSION / ASSESSMENT AND PLAN / ED COURSE  Pertinent labs & imaging results that were available during my care of the patient were reviewed by me and considered in my medical decision making (see chart for details).  Patient presents to the emergency department for 4 days of shortness of breath chest pressure and rapid heart rate.   Differential would include atrial fibrillation, atrial flutter, arrhythmia, ACS, pulmonary disorder.  We will check labs, chest x-ray, IV hydrate and continue to closely monitor.  Patient received 2.5 mg of IV metoprolol prior to arrival.  Patient is in atrial flutter with a rate around 90 bpm currently.  Denies any history of atrial fibrillation or flutter in the past.  Takes 81 mg aspirin as his only anticoagulant.  Patient appears well.  I discussed the patient with Dr. Saralyn Pilar he recommends starting the patient on 25 mg of metoprolol succinate twice daily instead of once daily.  He will see the patient in the office on Tuesday.  [facility] is also [facility], will refer to Dr. Yancey Flemings as well.  Patient is ambulated, states he feels much  better, heart rate is sustained in the 70s and 80s.  Cardiology states 81 mg aspirin is sufficient until he is seen in the office.  ____________________________________________   FINAL CLINICAL IMPRESSION(S) / ED DIAGNOSES  New onset atrial flutter    Harvest Dark, MD 05/18/18 1754

## 2018-06-12 ENCOUNTER — Other Ambulatory Visit: Payer: Self-pay | Admitting: Cardiovascular Disease

## 2018-06-20 ENCOUNTER — Ambulatory Visit: Payer: Medicare Other | Admitting: Anesthesiology

## 2018-06-20 ENCOUNTER — Encounter
Admission: RE | Disposition: A | Discharge status: Home / Self Care | Payer: Self-pay | Source: Ambulatory Visit | Attending: Cardiovascular Disease

## 2018-06-20 ENCOUNTER — Ambulatory Visit
Admission: RE | Admit: 2018-06-20 | Discharge: 2018-06-20 | Disposition: A | Discharge status: Home / Self Care | Payer: Medicare Other | Source: Ambulatory Visit | Attending: Registered Nurse | Admitting: Registered Nurse

## 2018-06-20 ENCOUNTER — Ambulatory Visit
Admission: RE | Admit: 2018-06-20 | Discharge: 2018-06-20 | Disposition: A | Discharge status: Home / Self Care | Payer: Medicare Other | Source: Ambulatory Visit | Attending: Cardiovascular Disease | Admitting: Cardiovascular Disease

## 2018-06-20 DIAGNOSIS — E119 Type 2 diabetes mellitus without complications: Secondary | ICD-10-CM | POA: Insufficient documentation

## 2018-06-20 DIAGNOSIS — I4891 Unspecified atrial fibrillation: Secondary | ICD-10-CM | POA: Diagnosis present

## 2018-06-20 DIAGNOSIS — I110 Hypertensive heart disease with heart failure: Secondary | ICD-10-CM | POA: Diagnosis not present

## 2018-06-20 DIAGNOSIS — Z8249 Family history of ischemic heart disease and other diseases of the circulatory system: Secondary | ICD-10-CM | POA: Insufficient documentation

## 2018-06-20 DIAGNOSIS — I2510 Atherosclerotic heart disease of native coronary artery without angina pectoris: Secondary | ICD-10-CM | POA: Insufficient documentation

## 2018-06-20 DIAGNOSIS — I252 Old myocardial infarction: Secondary | ICD-10-CM | POA: Diagnosis not present

## 2018-06-20 DIAGNOSIS — Z6835 Body mass index (BMI) 35.0-35.9, adult: Secondary | ICD-10-CM | POA: Insufficient documentation

## 2018-06-20 DIAGNOSIS — F329 Major depressive disorder, single episode, unspecified: Secondary | ICD-10-CM | POA: Diagnosis not present

## 2018-06-20 DIAGNOSIS — Z87891 Personal history of nicotine dependence: Secondary | ICD-10-CM | POA: Insufficient documentation

## 2018-06-20 DIAGNOSIS — Z7901 Long term (current) use of anticoagulants: Secondary | ICD-10-CM | POA: Insufficient documentation

## 2018-06-20 DIAGNOSIS — E669 Obesity, unspecified: Secondary | ICD-10-CM | POA: Diagnosis not present

## 2018-06-20 DIAGNOSIS — F419 Anxiety disorder, unspecified: Secondary | ICD-10-CM | POA: Insufficient documentation

## 2018-06-20 DIAGNOSIS — E785 Hyperlipidemia, unspecified: Secondary | ICD-10-CM | POA: Insufficient documentation

## 2018-06-20 DIAGNOSIS — M1990 Unspecified osteoarthritis, unspecified site: Secondary | ICD-10-CM | POA: Insufficient documentation

## 2018-06-20 DIAGNOSIS — Z951 Presence of aortocoronary bypass graft: Secondary | ICD-10-CM | POA: Diagnosis not present

## 2018-06-20 DIAGNOSIS — I509 Heart failure, unspecified: Secondary | ICD-10-CM | POA: Diagnosis not present

## 2018-06-20 HISTORY — PX: TEE WITHOUT CARDIOVERSION: SHX5443

## 2018-06-20 HISTORY — PX: CARDIOVERSION: EP1203

## 2018-06-20 SURGERY — ECHOCARDIOGRAM, TRANSESOPHAGEAL
Anesthesia: General

## 2018-06-20 MED ORDER — SODIUM CHLORIDE 0.9 % IV SOLN: INTRAVENOUS | Status: DC

## 2018-06-20 MED ORDER — LIDOCAINE VISCOUS HCL 2 % MT SOLN
OROMUCOSAL | Status: AC
  Filled 2018-06-20: qty 15

## 2018-06-20 MED ORDER — BUTAMBEN-TETRACAINE-BENZOCAINE 2-2-14 % EX AERO
INHALATION_SPRAY | CUTANEOUS | Status: AC
  Filled 2018-06-20: qty 5

## 2018-06-20 MED ORDER — PROPOFOL 10 MG/ML IV BOLUS
INTRAVENOUS | Status: DC | PRN
  Administered 2018-06-20: 30 mg via INTRAVENOUS
  Administered 2018-06-20: 50 mg via INTRAVENOUS
  Administered 2018-06-20: 30 mg via INTRAVENOUS

## 2018-06-20 MED ORDER — MIDAZOLAM HCL 2 MG/2ML IJ SOLN
INTRAMUSCULAR | Status: DC | PRN
  Administered 2018-06-20: 2 mg via INTRAVENOUS

## 2018-06-20 MED ORDER — PROPOFOL 10 MG/ML IV BOLUS
INTRAVENOUS | Status: AC
  Filled 2018-06-20: qty 40

## 2018-06-20 MED ORDER — MIDAZOLAM HCL 2 MG/2ML IJ SOLN
INTRAMUSCULAR | Status: AC
  Filled 2018-06-20: qty 2

## 2018-06-20 NOTE — Anesthesia Procedure Notes (Signed)
Performed by: Lance Muss, CRNA Pre-anesthesia Checklist: Patient identified, Emergency Drugs available, Patient being monitored, Suction available and Timeout performed Patient Re-evaluated:Patient Re-evaluated prior to induction Oxygen Delivery Method: Nasal cannula Induction Type: IV induction

## 2018-06-20 NOTE — Anesthesia Preprocedure Evaluation (Signed)
Anesthesia Evaluation  Patient identified by MRN, date of birth, ID band Patient awake    Reviewed: Allergy & Precautions, H&P , NPO status , Patient's Chart, lab work & pertinent test results, reviewed documented beta blocker date and time   History of Anesthesia Complications (+) AWARENESS UNDER ANESTHESIA and history of anesthetic complications  Airway Mallampati: III  TM Distance: >3 FB Neck ROM: full    Dental  (+) Missing, Dental Advidsory Given, Chipped, Teeth Intact   Pulmonary neg pulmonary ROS, former smoker,           Cardiovascular Exercise Tolerance: Good hypertension, (-) angina+ CAD, + Past MI and + CABG  (-) Cardiac Stents + dysrhythmias Atrial Fibrillation (-) Valvular Problems/Murmurs     Neuro/Psych PSYCHIATRIC DISORDERS Anxiety Depression negative neurological ROS     GI/Hepatic negative GI ROS, Neg liver ROS,   Endo/Other  diabetes  Renal/GU negative Renal ROS  negative genitourinary   Musculoskeletal   Abdominal   Peds  Hematology negative hematology ROS (+)   Anesthesia Other Findings Past Medical History: No date: Anxiety No date: Arthritis No date: Coronary artery disease     Comment:  a. s/p 3-v cabg in 2004 at [place] (LIMA and radial arery               used - no further details in [place]) No date: Depression No date: Diabetes mellitus with complication ([place]) No date: Essential hypertension No date: Family history of premature CAD     Comment:  a. father passed away from an MI at 30 No date: HLD (hyperlipidemia)     Comment:  a. statin intolerant  No date: Obesity   Reproductive/Obstetrics negative OB ROS                             Anesthesia Physical Anesthesia Plan  ASA: III  Anesthesia Plan: General   Post-op Pain Management:    Induction: Intravenous  PONV Risk Score and Plan: 2 and Propofol infusion and TIVA  Airway Management  Planned: Nasal Cannula  Additional Equipment:   Intra-op Plan:   Post-operative Plan:   Informed Consent: I have reviewed the patients History and Physical, chart, labs and discussed the procedure including the risks, benefits and alternatives for the proposed anesthesia with the patient or authorized representative who has indicated his/her understanding and acceptance.   Dental Advisory Given  Plan Discussed with: Anesthesiologist, CRNA and Surgeon  Anesthesia Plan Comments:         Anesthesia Quick Evaluation

## 2018-06-20 NOTE — Anesthesia Post-op Follow-up Note (Signed)
Anesthesia QCDR form completed.        

## 2018-06-20 NOTE — Anesthesia Postprocedure Evaluation (Signed)
Anesthesia Post Note  Patient: Derrick Gallegos  Procedure(s) Performed: TRANSESOPHAGEAL ECHOCARDIOGRAM (TEE) (N/A ) CARDIOVERSION (N/A )  Patient location during evaluation: Cath Lab Anesthesia Type: General Level of consciousness: awake and alert Pain management: pain level controlled Vital Signs Assessment: post-procedure vital signs reviewed and stable Respiratory status: spontaneous breathing, nonlabored ventilation, respiratory function stable and patient connected to nasal cannula oxygen Cardiovascular status: blood pressure returned to baseline and stable Postop Assessment: no apparent nausea or vomiting Anesthetic complications: no     Last Vitals:  Vitals:   06/20/18 0815 06/20/18 0830  BP: (!) 81/61 (!) 88/65  Pulse: (!) 59 (!) 57  Resp: 18 16  Temp:    SpO2: 93% 91%    Last Pain:  Vitals:   06/20/18 0654  TempSrc: Oral  PainSc:                  Martha Clan

## 2018-06-20 NOTE — Transfer of Care (Signed)
Immediate Anesthesia Transfer of Care Note  Patient: Derrick Gallegos  Procedure(s) Performed: TRANSESOPHAGEAL ECHOCARDIOGRAM (TEE) (N/A ) CARDIOVERSION (N/A )  Patient Location: PACU  Anesthesia Type:General  Level of Consciousness: sedated  Airway & Oxygen Therapy: Patient Spontanous Breathing and Patient connected to nasal cannula oxygen  Post-op Assessment: Report given to RN and Post -op Vital signs reviewed and stable  Post vital signs: Reviewed and stable  Last Vitals:  Vitals Value Taken Time  BP 95/68 06/20/2018  7:53 AM  Temp    Pulse 61 06/20/2018  7:53 AM  Resp 15 06/20/2018  7:53 AM  SpO2 91 % 06/20/2018  7:53 AM    Last Pain:  Vitals:   06/20/18 0654  TempSrc: Oral         Complications: No apparent anesthesia complications

## 2018-06-20 NOTE — Progress Notes (Signed)
*  PRELIMINARY RESULTS* Echocardiogram Echocardiogram Transesophageal has been performed.  Derrick Gallegos 06/20/2018, 8:01 AM

## 2018-08-07 ENCOUNTER — Other Ambulatory Visit: Payer: Self-pay | Admitting: Cardiovascular Disease

## 2018-08-08 ENCOUNTER — Encounter: Payer: Self-pay | Admitting: *Deleted

## 2018-08-08 ENCOUNTER — Inpatient Hospital Stay
Admission: RE | Admit: 2018-08-08 | Discharge: 2018-08-09 | DRG: 247 | Disposition: A | Discharge status: Home / Self Care | Payer: Medicare Other | Source: Ambulatory Visit | Attending: Specialist | Admitting: Specialist

## 2018-08-08 ENCOUNTER — Encounter
Admission: RE | Disposition: A | Discharge status: Home / Self Care | Payer: Self-pay | Source: Ambulatory Visit | Attending: Internal Medicine

## 2018-08-08 DIAGNOSIS — Z79899 Other long term (current) drug therapy: Secondary | ICD-10-CM | POA: Diagnosis not present

## 2018-08-08 DIAGNOSIS — I2510 Atherosclerotic heart disease of native coronary artery without angina pectoris: Secondary | ICD-10-CM | POA: Diagnosis present

## 2018-08-08 DIAGNOSIS — Z951 Presence of aortocoronary bypass graft: Secondary | ICD-10-CM

## 2018-08-08 DIAGNOSIS — Z8249 Family history of ischemic heart disease and other diseases of the circulatory system: Secondary | ICD-10-CM | POA: Diagnosis not present

## 2018-08-08 DIAGNOSIS — Z7984 Long term (current) use of oral hypoglycemic drugs: Secondary | ICD-10-CM | POA: Diagnosis not present

## 2018-08-08 DIAGNOSIS — Z7901 Long term (current) use of anticoagulants: Secondary | ICD-10-CM | POA: Diagnosis not present

## 2018-08-08 DIAGNOSIS — E785 Hyperlipidemia, unspecified: Secondary | ICD-10-CM | POA: Diagnosis present

## 2018-08-08 DIAGNOSIS — I200 Unstable angina: Secondary | ICD-10-CM

## 2018-08-08 DIAGNOSIS — Z87891 Personal history of nicotine dependence: Secondary | ICD-10-CM

## 2018-08-08 DIAGNOSIS — E118 Type 2 diabetes mellitus with unspecified complications: Secondary | ICD-10-CM

## 2018-08-08 DIAGNOSIS — I10 Essential (primary) hypertension: Secondary | ICD-10-CM | POA: Diagnosis present

## 2018-08-08 DIAGNOSIS — I4891 Unspecified atrial fibrillation: Secondary | ICD-10-CM | POA: Diagnosis present

## 2018-08-08 DIAGNOSIS — E119 Type 2 diabetes mellitus without complications: Secondary | ICD-10-CM | POA: Diagnosis present

## 2018-08-08 DIAGNOSIS — I25110 Atherosclerotic heart disease of native coronary artery with unstable angina pectoris: Secondary | ICD-10-CM | POA: Diagnosis present

## 2018-08-08 HISTORY — PX: LEFT HEART CATH AND CORONARY ANGIOGRAPHY: CATH118249

## 2018-08-08 HISTORY — PX: CORONARY STENT INTERVENTION: CATH118234

## 2018-08-08 LAB — CBC
HCT: 41.5 % (ref 40.0–52.0)
Hemoglobin: 14.7 g/dL (ref 13.0–18.0)
MCH: 33.1 pg (ref 26.0–34.0)
MCHC: 35.5 g/dL (ref 32.0–36.0)
MCV: 93.2 fL (ref 80.0–100.0)
Platelets: 327 10*3/uL (ref 150–440)
RBC: 4.45 MIL/uL (ref 4.40–5.90)
RDW: 13.6 % (ref 11.5–14.5)
WBC: 7.4 10*3/uL (ref 3.8–10.6)

## 2018-08-08 LAB — TROPONIN I
Troponin I: <0.03 ng/mL (ref <0.03)
Troponin I: <0.03 ng/mL (ref <0.03)

## 2018-08-08 LAB — BASIC METABOLIC PANEL
Anion gap: 9 (ref 5–15)
BUN: 14 mg/dL (ref 8–23)
CO2: 25 mmol/L (ref 22–32)
Calcium: 9.3 mg/dL (ref 8.9–10.3)
Chloride: 103 mmol/L (ref 98–111)
Creatinine, Ser: 0.83 mg/dL (ref 0.61–1.24)
GFR calc Af Amer: >60 mL/min (ref >60)
GFR calc non Af Amer: >60 mL/min (ref >60)
Glucose, Bld: 123 mg/dL — ABNORMAL HIGH (ref 70–99)
Potassium: 4.1 mmol/L (ref 3.5–5.1)
Sodium: 137 mmol/L (ref 135–145)

## 2018-08-08 LAB — POCT ACTIVATED CLOTTING TIME: Activated Clotting Time: 384 seconds

## 2018-08-08 LAB — MRSA PCR SCREENING: MRSA by PCR: NEGATIVE

## 2018-08-08 SURGERY — LEFT HEART CATH
Anesthesia: Moderate Sedation | Laterality: Right

## 2018-08-08 SURGERY — LEFT HEART CATH AND CORONARY ANGIOGRAPHY
Anesthesia: Moderate Sedation

## 2018-08-08 MED ORDER — SODIUM CHLORIDE 0.9 % WEIGHT BASED INFUSION: 1.0000 mL/kg/h | INTRAVENOUS | Status: DC

## 2018-08-08 MED ORDER — VITAMIN D 1000 UNITS PO TABS
1000.0000 [IU] | ORAL_TABLET | Freq: Every day | ORAL | Status: DC
Start: 2018-08-09 — End: 2018-08-09
  Administered 2018-08-09: 1000 [IU] via ORAL
  Filled 2018-08-08: qty 1

## 2018-08-08 MED ORDER — SODIUM CHLORIDE 0.9% FLUSH: 3.0000 mL | Freq: Two times a day (BID) | INTRAVENOUS | Status: DC

## 2018-08-08 MED ORDER — BIVALIRUDIN BOLUS VIA INFUSION - CUPID
INTRAVENOUS | Status: DC | PRN
  Administered 2018-08-08: 90.825 mg via INTRAVENOUS

## 2018-08-08 MED ORDER — NITROGLYCERIN 0.4 MG SL SUBL: 0.4000 mg | SUBLINGUAL_TABLET | SUBLINGUAL | Status: DC | PRN

## 2018-08-08 MED ORDER — NITROGLYCERIN 5 MG/ML IV SOLN
INTRAVENOUS | Status: AC
  Filled 2018-08-08: qty 10

## 2018-08-08 MED ORDER — MIDAZOLAM HCL 2 MG/2ML IJ SOLN
INTRAMUSCULAR | Status: AC
  Filled 2018-08-08: qty 2

## 2018-08-08 MED ORDER — METOPROLOL TARTRATE 25 MG PO TABS
25.0000 mg | ORAL_TABLET | Freq: Two times a day (BID) | ORAL | Status: DC
  Administered 2018-08-09: 25 mg via ORAL
  Filled 2018-08-08: qty 1

## 2018-08-08 MED ORDER — GLIPIZIDE ER 5 MG PO TB24
5.0000 mg | ORAL_TABLET | Freq: Every evening | ORAL | Status: DC
  Administered 2018-08-08: 5 mg via ORAL
  Filled 2018-08-08 (×2): qty 1

## 2018-08-08 MED ORDER — IOPAMIDOL (ISOVUE-300) INJECTION 61%
INTRAVENOUS | Status: DC | PRN
  Administered 2018-08-08: 210 mL via INTRA_ARTERIAL
  Administered 2018-08-08: 100 mL via INTRA_ARTERIAL

## 2018-08-08 MED ORDER — SODIUM CHLORIDE 0.9 % IV SOLN: 250.0000 mL | INTRAVENOUS | Status: DC | PRN

## 2018-08-08 MED ORDER — LISINOPRIL 20 MG PO TABS
20.0000 mg | ORAL_TABLET | Freq: Every day | ORAL | Status: DC
  Administered 2018-08-09: 20 mg via ORAL
  Filled 2018-08-08 (×2): qty 2
  Filled 2018-08-08: qty 4
  Filled 2018-08-08: qty 1

## 2018-08-08 MED ORDER — BIVALIRUDIN TRIFLUOROACETATE 250 MG IV SOLR
INTRAVENOUS | Status: AC
  Filled 2018-08-08: qty 250

## 2018-08-08 MED ORDER — ONDANSETRON HCL 4 MG/2ML IJ SOLN: 4.0000 mg | Freq: Four times a day (QID) | INTRAMUSCULAR | Status: DC | PRN

## 2018-08-08 MED ORDER — HYDRALAZINE HCL 20 MG/ML IJ SOLN: 5.0000 mg | INTRAMUSCULAR | Status: AC | PRN

## 2018-08-08 MED ORDER — ASPIRIN 81 MG PO CHEW: 324.0000 mg | CHEWABLE_TABLET | ORAL | Status: DC

## 2018-08-08 MED ORDER — ASPIRIN 81 MG PO CHEW: 81.0000 mg | CHEWABLE_TABLET | Freq: Every day | ORAL | Status: DC

## 2018-08-08 MED ORDER — PRAVASTATIN SODIUM 20 MG PO TABS: 20.0000 mg | ORAL_TABLET | Freq: Every day | ORAL | Status: DC

## 2018-08-08 MED ORDER — HEPARIN (PORCINE) IN NACL 1000-0.9 UT/500ML-% IV SOLN
INTRAVENOUS | Status: AC
  Filled 2018-08-08: qty 1000

## 2018-08-08 MED ORDER — ASPIRIN EC 81 MG PO TBEC
81.0000 mg | DELAYED_RELEASE_TABLET | Freq: Every day | ORAL | Status: DC
  Administered 2018-08-09: 81 mg via ORAL
  Filled 2018-08-08: qty 1

## 2018-08-08 MED ORDER — SODIUM CHLORIDE 0.9% FLUSH: 3.0000 mL | INTRAVENOUS | Status: DC | PRN

## 2018-08-08 MED ORDER — OMEGA-3-ACID ETHYL ESTERS 1 G PO CAPS: 1.0000 g | ORAL_CAPSULE | Freq: Every evening | ORAL | Status: DC

## 2018-08-08 MED ORDER — ISOSORBIDE MONONITRATE ER 30 MG PO TB24
30.0000 mg | ORAL_TABLET | Freq: Every evening | ORAL | Status: DC
  Administered 2018-08-08: 30 mg via ORAL
  Filled 2018-08-08: qty 1

## 2018-08-08 MED ORDER — ACETAMINOPHEN 325 MG PO TABS
650.0000 mg | ORAL_TABLET | ORAL | Status: DC | PRN
  Administered 2018-08-08 – 2018-08-09 (×2): 650 mg via ORAL
  Filled 2018-08-08 (×2): qty 2

## 2018-08-08 MED ORDER — LIDOCAINE HCL (PF) 1 % IJ SOLN
INTRAMUSCULAR | Status: AC
  Filled 2018-08-08: qty 30

## 2018-08-08 MED ORDER — ASPIRIN 81 MG PO CHEW
CHEWABLE_TABLET | ORAL | Status: DC | PRN
  Administered 2018-08-08: 324 mg via ORAL

## 2018-08-08 MED ORDER — CLOPIDOGREL BISULFATE 300 MG PO TABS
ORAL_TABLET | ORAL | Status: AC
  Filled 2018-08-08: qty 2

## 2018-08-08 MED ORDER — SODIUM CHLORIDE 0.9 % IV SOLN
INTRAVENOUS | Status: AC | PRN
  Administered 2018-08-08 (×2): 1.75 mg/kg/h via INTRAVENOUS

## 2018-08-08 MED ORDER — LABETALOL HCL 5 MG/ML IV SOLN: 10.0000 mg | INTRAVENOUS | Status: AC | PRN

## 2018-08-08 MED ORDER — SODIUM CHLORIDE 0.9 % WEIGHT BASED INFUSION
3.0000 mL/kg/h | INTRAVENOUS | Status: AC
  Administered 2018-08-08: 3 mL/kg/h via INTRAVENOUS

## 2018-08-08 MED ORDER — ATORVASTATIN CALCIUM 20 MG PO TABS
80.0000 mg | ORAL_TABLET | Freq: Every day | ORAL | Status: DC
  Administered 2018-08-08: 80 mg via ORAL
  Filled 2018-08-08: qty 4

## 2018-08-08 MED ORDER — SODIUM CHLORIDE 0.9 % WEIGHT BASED INFUSION
1.0000 mL/kg/h | INTRAVENOUS | Status: AC
  Administered 2018-08-08: 1 mL/kg/h via INTRAVENOUS

## 2018-08-08 MED ORDER — FENTANYL CITRATE (PF) 100 MCG/2ML IJ SOLN
INTRAMUSCULAR | Status: AC
  Filled 2018-08-08: qty 2

## 2018-08-08 MED ORDER — AMIODARONE HCL 200 MG PO TABS
200.0000 mg | ORAL_TABLET | Freq: Every evening | ORAL | Status: DC
  Administered 2018-08-08: 200 mg via ORAL
  Filled 2018-08-08: qty 1

## 2018-08-08 MED ORDER — MIDAZOLAM HCL 2 MG/2ML IJ SOLN
INTRAMUSCULAR | Status: DC | PRN
  Administered 2018-08-08 (×4): 1 mg via INTRAVENOUS

## 2018-08-08 MED ORDER — CLOPIDOGREL BISULFATE 75 MG PO TABS
75.0000 mg | ORAL_TABLET | Freq: Every day | ORAL | Status: DC
  Administered 2018-08-09: 75 mg via ORAL
  Filled 2018-08-08: qty 1

## 2018-08-08 MED ORDER — ASPIRIN 81 MG PO CHEW: 81.0000 mg | CHEWABLE_TABLET | ORAL | Status: DC

## 2018-08-08 MED ORDER — TICAGRELOR 90 MG PO TABS: 90.0000 mg | ORAL_TABLET | Freq: Two times a day (BID) | ORAL | Status: DC

## 2018-08-08 MED ORDER — GLIPIZIDE 5 MG PO TABS: 5.0000 mg | ORAL_TABLET | Freq: Every day | ORAL | Status: DC

## 2018-08-08 MED ORDER — ASPIRIN 300 MG RE SUPP: 300.0000 mg | RECTAL | Status: DC

## 2018-08-08 MED ORDER — CLOPIDOGREL BISULFATE 75 MG PO TABS
ORAL_TABLET | ORAL | Status: DC | PRN
  Administered 2018-08-08: 300 mg via ORAL

## 2018-08-08 MED ORDER — NITROGLYCERIN 1 MG/10 ML FOR IR/CATH LAB
INTRA_ARTERIAL | Status: DC | PRN
  Administered 2018-08-08 (×2): 300 ug via INTRACORONARY

## 2018-08-08 MED ORDER — ASPIRIN 81 MG PO CHEW
CHEWABLE_TABLET | ORAL | Status: AC
  Filled 2018-08-08: qty 4

## 2018-08-08 MED ORDER — AMIODARONE HCL 200 MG PO TABS: 200.0000 mg | ORAL_TABLET | Freq: Every day | ORAL | Status: DC

## 2018-08-08 MED ORDER — FENTANYL CITRATE (PF) 100 MCG/2ML IJ SOLN
INTRAMUSCULAR | Status: DC | PRN
  Administered 2018-08-08 (×3): 25 ug via INTRAVENOUS
  Administered 2018-08-08: 50 ug via INTRAVENOUS

## 2018-08-08 MED ORDER — ISOSORBIDE MONONITRATE ER 30 MG PO TB24: 30.0000 mg | ORAL_TABLET | Freq: Every day | ORAL | Status: DC

## 2018-08-08 MED ORDER — ACETAMINOPHEN 325 MG PO TABS
650.0000 mg | ORAL_TABLET | ORAL | Status: DC | PRN
Start: 2018-08-08 — End: 2018-08-08

## 2018-08-08 SURGICAL SUPPLY — 26 items
BALLN MINITREK RX 2.0X12 (BALLOONS) ×4
BALLN TREK RX 2.5X15 (BALLOONS) ×4
BALLN ~~LOC~~ TREK RX 3.25X12 (BALLOONS) ×4
BALLOON MINITREK RX 2.0X12 (BALLOONS) ×2 IMPLANT
BALLOON TREK RX 2.5X15 (BALLOONS) ×2 IMPLANT
BALLOON ~~LOC~~ TREK RX 3.25X12 (BALLOONS) ×2 IMPLANT
CATH ANGIO 5F JB2 100CM (CATHETERS) ×4 IMPLANT
CATH INFINITI 5 FR IM (CATHETERS) ×4 IMPLANT
CATH INFINITI 5FR ANG PIGTAIL (CATHETERS) ×4 IMPLANT
CATH INFINITI 5FR JL4 (CATHETERS) ×4 IMPLANT
CATH INFINITI 5FR JL5 (CATHETERS) ×4 IMPLANT
CATH INFINITI JR4 5F (CATHETERS) ×4 IMPLANT
CATH VISTA GUIDE 6FR XB3.5 (CATHETERS) ×4 IMPLANT
DEVICE CLOSURE MYNXGRIP 6/7F (Vascular Products) ×4 IMPLANT
DEVICE INFLAT 30 PLUS (MISCELLANEOUS) ×4 IMPLANT
KIT MANI 3VAL PERCEP (MISCELLANEOUS) ×4 IMPLANT
NEEDLE PERC 18GX7CM (NEEDLE) ×4 IMPLANT
PACK CARDIAC CATH (CUSTOM PROCEDURE TRAY) ×4 IMPLANT
SHEATH AVANTI 5FR X 11CM (SHEATH) ×4 IMPLANT
SHEATH AVANTI 6FR X 11CM (SHEATH) ×4 IMPLANT
STENT RESOLUTE ONYX 2.0X12 (Permanent Stent) ×4 IMPLANT
STENT RESOLUTE ONYX 3.0X15 (Permanent Stent) ×4 IMPLANT
TUBING CIL FLEX 10 FLL-RA (TUBING) ×4 IMPLANT
WIRE ASAHI PROWATER 180CM (WIRE) ×8 IMPLANT
WIRE EMERALD 3MM-J .035X260CM (WIRE) ×4 IMPLANT
WIRE GUIDERIGHT .035X150 (WIRE) ×4 IMPLANT

## 2018-08-08 NOTE — Care Management (Signed)
[employer]il[employer] voucher placed in front of patient chart.

## 2018-08-08 NOTE — H&P (Addendum)
[facility] NAME: Derrick Gallegos    MR#:  [id number]  DATE OF BIRTH:  1951/12/21  DATE OF ADMISSION:  08/08/2018  PRIMARY CARE PHYSICIAN: [facility]   REQUESTING/REFERRING PHYSICIAN:   CHIEF COMPLAINT:  Shortness of breath  HISTORY OF PRESENT ILLNESS: Derrick Gallegos  is a 67 y.o. male with a known history of atrial fibrillation, coronary artery disease, diabetes mellitus type 2, hypertension, hyperlipidemia was referred by cardiology for cardiac catheterization procedure.  Patient had atrial fibrillation 6 months ago and had cardioversion done by cardiologist.  He still has shortness of breath and he came for evaluation upon referral by cardiology office.  Patient had cardiac cath done today and he had a stent placed in proximal and distal circumflex.  Patient started on bivalirudin drip.  No complaints of any chest pain status post cardiac cath.  PAST MEDICAL HISTORY:   Past Medical History:  Diagnosis Date  . Anxiety   . Arthritis   . Coronary artery disease    a. s/p 3-v cabg in 2004 at [facility] (LIMA and radial arery used - no further details in [facility])  . Depression   . Diabetes mellitus with complication ([facility])   . Essential hypertension   . Family history of premature CAD    a. father passed away from an MI at 45  . HLD (hyperlipidemia)    a. statin intolerant   . Obesity     PAST SURGICAL HISTORY:  Past Surgical History:  Procedure Laterality Date  . CARDIAC SURGERY    . CARDIOVERSION N/A 06/20/2018   Procedure: CARDIOVERSION;  Surgeon: Dionisio David, MD;  Location: [facility] ORS;  Service: Cardiovascular;  Laterality: N/A;  . TEE WITHOUT CARDIOVERSION N/A 06/20/2018   Procedure: TRANSESOPHAGEAL ECHOCARDIOGRAM (TEE);  Surgeon: Dionisio David, MD;  Location: [facility] ORS;  Service: Cardiovascular;  Laterality: N/A;    SOCIAL HISTORY:  Social History   Tobacco Use  . Smoking status: Former  Smoker    Types: Cigarettes  . Smokeless tobacco: Never Used  Substance Use Topics  . Alcohol use: No    FAMILY HISTORY:  Family History  Problem Relation Age of Onset  . CAD Mother   . CAD Father        a. MI at 57-->death  . CAD Brother     DRUG ALLERGIES: No Known Allergies  REVIEW OF SYSTEMS:   CONSTITUTIONAL: No fever, fatigue or weakness.  EYES: No blurred or double vision.  EARS, NOSE, AND THROAT: No tinnitus or ear pain.  RESPIRATORY: No cough, had shortness of breath,no wheezing or hemoptysis.  CARDIOVASCULAR: No chest pain, orthopnea, edema.  GASTROINTESTINAL: No nausea, vomiting, diarrhea or abdominal pain.  GENITOURINARY: No dysuria, hematuria.  ENDOCRINE: No polyuria, nocturia,  HEMATOLOGY: No anemia, easy bruising or bleeding SKIN: No rash or lesion. MUSCULOSKELETAL: No joint pain or arthritis.   NEUROLOGIC: No tingling, numbness, weakness.  PSYCHIATRY: No anxiety or depression.   MEDICATIONS AT HOME:  Prior to Admission medications   Medication Sig Start Date End Date Taking? Authorizing Provider  acetaminophen (TYLENOL) 500 MG tablet Take 1,000 mg by mouth every 6 (six) hours as needed for moderate pain or headache.    Yes [provider]  amiodarone (PACERONE) 200 MG tablet Take 200 mg by mouth every evening.   Yes [provider]  Cholecalciferol (VITAMIN D3 PO) Take 1 capsule by mouth daily.   Yes [provider]  glipiZIDE (GLUCOTROL XL) 5 MG 24 hr tablet Take 5 mg by mouth every evening.   Yes [provider]  isosorbide mononitrate (IMDUR) 30 MG 24 hr tablet Take 30 mg by mouth every evening.   Yes [provider]  Omega-3 Fatty Acids (FISH OIL PO) Take 2 capsules by mouth every evening.    Yes [provider]  pravastatin (PRAVACHOL) 20 MG tablet Take 20 mg by mouth every evening.   Yes [provider]  rivaroxaban (XARELTO) 20 MG TABS tablet Take 20 mg by mouth daily with supper.   Yes  [provider]  VITAMIN E PO Take 1 capsule by mouth daily.   Yes [provider]  metoprolol succinate (TOPROL XL) 25 MG 24 hr tablet Take 1 tablet (25 mg total) by mouth 2 (two) times daily. Patient not taking: Reported on 08/05/2018 05/18/18 08/05/18  Harvest Dark, MD  nitroGLYCERIN (NITROSTAT) 0.4 MG SL tablet Place 1 tablet (0.4 mg total) under the tongue every 5 (five) minutes as needed for chest pain. Patient not taking: Reported on 08/05/2018 11/17/17   Bettey Costa, MD      PHYSICAL EXAMINATION:   VITAL SIGNS: Blood pressure (!) 144/87, pulse (!) 56, temperature 98 F (36.7 C), temperature source Oral, resp. rate 14, height 5\' 11"  (1.803 m), weight 121.1 kg, SpO2 96 %.  GENERAL:  67 y.o.-year-old patient lying in the bed with no acute distress.  EYES: Pupils equal, round, reactive to light and accommodation. No scleral icterus. Extraocular muscles intact.  HEENT: Head atraumatic, normocephalic. Oropharynx and nasopharynx clear.  NECK:  Supple, no jugular venous distention. No thyroid enlargement, no tenderness.  LUNGS: Normal breath sounds bilaterally, no wheezing, rales,rhonchi or crepitation. No use of accessory muscles of respiration.  CARDIOVASCULAR: S1, S2 normal. No murmurs, rubs, or gallops.  ABDOMEN: Soft, nontender, nondistended. Bowel sounds present. No organomegaly or mass.  EXTREMITIES: No pedal edema, cyanosis, or clubbing.  NEUROLOGIC: Cranial nerves II through XII are intact. Muscle strength 5/5 in all extremities. Sensation intact. Gait not checked.  PSYCHIATRIC: The patient is alert and oriented x 3.  SKIN: No obvious rash, lesion, or ulcer.   LABORATORY PANEL:   CBC Recent Labs  Lab 08/08/18 1046  WBC 7.4  HGB 14.7  HCT 41.5  PLT 327  MCV 93.2  MCH 33.1  MCHC 35.5  RDW 13.6   ------------------------------------------------------------------------------------------------------------------  Chemistries  Recent Labs  Lab  08/08/18 1046  NA 137  K 4.1  CL 103  CO2 25  GLUCOSE 123*  BUN 14  CREATININE 0.83  CALCIUM 9.3   ------------------------------------------------------------------------------------------------------------------ estimated creatinine clearance is 114.3 mL/min (by C-G formula based on SCr of 0.83 mg/dL). ------------------------------------------------------------------------------------------------------------------ No results for input(s): TSH, T4TOTAL, T3FREE, THYROIDAB in the last 72 hours.  Invalid input(s): FREET3   Coagulation profile No results for input(s): INR, PROTIME in the last 168 hours. ------------------------------------------------------------------------------------------------------------------- No results for input(s): DDIMER in the last 72 hours. -------------------------------------------------------------------------------------------------------------------  Cardiac Enzymes No results for input(s): CKMB, TROPONINI, MYOGLOBIN in the last 168 hours.  Invalid input(s): CK ------------------------------------------------------------------------------------------------------------------ Invalid input(s): POCBNP  ---------------------------------------------------------------------------------------------------------------  Urinalysis No results found for: COLORURINE, APPEARANCEUR, LABSPEC, PHURINE, GLUCOSEU, HGBUR, BILIRUBINUR, KETONESUR, PROTEINUR, UROBILINOGEN, NITRITE, LEUKOCYTESUR   RADIOLOGY: No results found.  EKG: Orders placed or performed during the hospital encounter of 08/08/18  . EKG 12-Lead immediately post procedure  . EKG 12-Lead  . EKG 12-Lead  . EKG 12-Lead  . EKG 12-Lead immediately post procedure    IMPRESSION AND PLAN:  67 year old male patient with history of coronary artery disease, atrial fibrillation, hypertension, hyperlipidemia, diabetes mellitus type 2 presented to the hospital for cardiac catheterization and  evaluation.  -Status post cardiac cath and stent placement CAD Continue IV bivalirudin drip Start patient on aspirin and Brilinta Continue statin medication Cycle troponin Follow-up echocardiogram Cardiology follow-up  -Type 2 diabetes mellitus Diabetic diet with sliding scale coverage with insulin  -Hyperlipidemia Statin medication  -Hypertension Resume beta-blocker  -DVT prophylaxis On anticoagulation with heparin drip  All the records are reviewed and case discussed with ED provider. Management plans discussed with the patient, family and they are in agreement.  CODE STATUS:Full code    Code Status Orders  (From admission, onward)         Start     Ordered   08/08/18 1421  Full code  Continuous     08/08/18 1420        Code Status History    Date Active Date Inactive Code Status Order ID Comments User Context   11/16/2017 1346 11/17/2017 1722 Full Code [id number]  Max Sane, MD Inpatient       TOTAL TIME TAKING CARE OF THIS PATIENT: 53 minutes.    Saundra Shelling M.D on 08/08/2018 at 2:51 PM  Between 7am to 6pm - Pager - [phone]  After 6pm go to [email or web address] - password [email or web address] [facility]  Office  [phone]  CC: Primary care physician; [facility] [place]

## 2018-08-08 NOTE — Progress Notes (Signed)
Advanced care plan.  Purpose of the Encounter: CODE STATUS  Parties in Attendance: Patient and family  Patient's Decision Capacity: Good  Subjective/Patient's story: Presented to hospital after being referred by cardiologist for cardiac cath for unstable angina   Objective/Medical story Patient had cardiac cath and stents placed today Needs monitoring and medications   Goals of care determination:  Advance care directives goals of care discussed Patient wants everything done which includes cpr, intubation and ventilator if need arises   CODE STATUS:    Time spent discussing advanced care planning: 16 minutes

## 2018-08-08 NOTE — Consult Note (Signed)
67 y.o. Male admitted to [facility] s/p cardiac catheterization with Drug eluting stent placement in proximal left circumflex and distal left circumflex.  He was started on Bivalirudin drip, and has no complaints of chest pain post cath. He remains Afebrile and hemodynamically stable on room air.  PCCM service is available as needed for any medical/ICU needs.  Darel Hong, AGACNP-BC [facility] Pager: [phone]

## 2018-08-09 ENCOUNTER — Other Ambulatory Visit: Payer: Self-pay

## 2018-08-09 DIAGNOSIS — I25110 Atherosclerotic heart disease of native coronary artery with unstable angina pectoris: Secondary | ICD-10-CM | POA: Diagnosis not present

## 2018-08-09 LAB — TROPONIN I: Troponin I: 0.03 ng/mL (ref <0.03)

## 2018-08-09 LAB — URINE DRUG SCREEN, QUALITATIVE (ARMC ONLY)
Amphetamines, Ur Screen: NOT DETECTED
Barbiturates, Ur Screen: NOT DETECTED
Cannabinoid 50 Ng, Ur ~~LOC~~: NOT DETECTED
Cocaine Metabolite,Ur ~~LOC~~: NOT DETECTED
MDMA (Ecstasy)Ur Screen: NOT DETECTED
Methadone Scn, Ur: NOT DETECTED
Opiate, Ur Screen: NOT DETECTED
Phencyclidine (PCP) Ur S: NOT DETECTED
Tricyclic, Ur Screen: NOT DETECTED

## 2018-08-09 LAB — BASIC METABOLIC PANEL
Anion gap: 8 (ref 5–15)
BUN: 16 mg/dL (ref 8–23)
CO2: 27 mmol/L (ref 22–32)
Calcium: 8.5 mg/dL — ABNORMAL LOW (ref 8.9–10.3)
Chloride: 104 mmol/L (ref 98–111)
Creatinine, Ser: 1 mg/dL (ref 0.61–1.24)
GFR calc Af Amer: >60 mL/min (ref >60)
GFR calc non Af Amer: >60 mL/min (ref >60)
Glucose, Bld: 141 mg/dL — ABNORMAL HIGH (ref 70–99)
Potassium: 4.5 mmol/L (ref 3.5–5.1)
Sodium: 139 mmol/L (ref 135–145)

## 2018-08-09 LAB — CBC
HCT: 37.9 % — ABNORMAL LOW (ref 40.0–52.0)
Hemoglobin: 13.4 g/dL (ref 13.0–18.0)
MCH: 32.8 pg (ref 26.0–34.0)
MCHC: 35.2 g/dL (ref 32.0–36.0)
MCV: 93.2 fL (ref 80.0–100.0)
Platelets: 268 10*3/uL (ref 150–440)
RBC: 4.07 MIL/uL — ABNORMAL LOW (ref 4.40–5.90)
RDW: 13.9 % (ref 11.5–14.5)
WBC: 7 10*3/uL (ref 3.8–10.6)

## 2018-08-09 LAB — PHOSPHORUS: Phosphorus: 3.3 mg/dL (ref 2.5–4.6)

## 2018-08-09 LAB — LIPID PANEL
Cholesterol: 203 mg/dL — ABNORMAL HIGH (ref 0–200)
HDL: 35 mg/dL — ABNORMAL LOW (ref >40)
LDL Cholesterol: UNDETERMINED mg/dL (ref 0–99)
Total CHOL/HDL Ratio: 5.8 RATIO
Triglycerides: 579 mg/dL — ABNORMAL HIGH (ref <150)
VLDL: UNDETERMINED mg/dL (ref 0–40)

## 2018-08-09 LAB — HIV ANTIBODY (ROUTINE TESTING W REFLEX): HIV Screen 4th Generation wRfx: NONREACTIVE

## 2018-08-09 LAB — MAGNESIUM: Magnesium: 2 mg/dL (ref 1.7–2.4)

## 2018-08-09 LAB — GLUCOSE, CAPILLARY: Glucose-Capillary: 137 mg/dL — ABNORMAL HIGH (ref 70–99)

## 2018-08-09 MED ORDER — LISINOPRIL 20 MG PO TABS: 20.0000 mg | ORAL_TABLET | Freq: Every day | ORAL | 1 refills | Status: DC

## 2018-08-09 MED ORDER — ATORVASTATIN CALCIUM 80 MG PO TABS: 80.0000 mg | ORAL_TABLET | Freq: Every day | ORAL | 1 refills | Status: DC

## 2018-08-09 MED ORDER — METOPROLOL TARTRATE 25 MG PO TABS: 25.0000 mg | ORAL_TABLET | Freq: Two times a day (BID) | ORAL | 1 refills | Status: DC

## 2018-08-09 MED ORDER — CLOPIDOGREL BISULFATE 75 MG PO TABS: 75.0000 mg | ORAL_TABLET | Freq: Every day | ORAL | 1 refills | Status: AC

## 2018-08-09 MED ORDER — ASPIRIN 81 MG PO TBEC: 81.0000 mg | DELAYED_RELEASE_TABLET | Freq: Every day | ORAL | 1 refills | Status: AC

## 2018-08-09 NOTE — Discharge Instructions (Signed)
Cardiac Rehabilitation What is cardiac rehabilitation? Cardiac rehabilitation is a treatment program that helps improve the health and well-being of people who have heart problems. Cardiac rehabilitation includes exercise training, education, and counseling to help you get stronger and return to an active lifestyle. This program can help you get better faster and reduce any future hospital stays. Why might I need cardiac rehabilitation?  Cardiac rehabilitation programs can help when you have or have had:  A heart attack.  Heart failure.  Peripheral artery disease.  Coronary artery disease.  Angina.  Lung or breathing problems.  Cardiac rehabilitation programs are also used when you have had:  Coronary artery bypass graft surgery.  Heart valve replacement.  Heart stent placement.  Heart transplant.  Aneurysm repair.  What are the benefits of cardiac rehabilitation? Cardiac rehabilitation can help:  Reduce problems like chest pain and trouble breathing.  Change risk factors that contribute to heart disease, such as: ? Smoking. ? High blood pressure. ? High cholesterol. ? Diabetes. ? Being out of shape or not active. ? Weighing more than 30% higher than your ideal weight. ? Diet.  Improve your mental outlook so you feel: ? More hopeful. ? Better about yourself. ? More confident about taking care of yourself.  Get support from health experts as well as other people with similar problems.  Learn how to manage and understand your medicines.  Teach your family about your condition and how to participate in your recovery.  What happens in cardiac rehabilitation? You will be assessed by a cardiac rehabilitation team. They will check your health history and do a physical exam. You may need blood tests, stress tests, and other evaluations to make sure that you are ready to start cardiac rehabilitation. The cardiac rehabilitation team works with you to make a plan based  on your health and goals. Your program will be tailored to fit you and your needs and may change as you progress. You may work with a health care team that includes:  Doctors.  Nurses.  Dietitians.  Psychologists.  Exercise specialists.  Physical and occupational therapists.  What are the phases of cardiac rehabilitation? A cardiac rehabilitation program is often divided into phases. You advance from one phase to the next. Phase One This phase starts while you are still in the hospital. You may start by walking in your room and then in the hall. You may start some simple exercises with a therapist. Phase Two This phase begins when you go home or to another facility. This phase may last 8-12 weeks. You will travel to a cardiac rehabilitation center or another place where rehabilitation is offered. You will slowly increase your activity level while being closely watched by a nurse or therapist. Exercises may include a combination of strength or resistance training and cardio or aerobic movement on a treadmill or other machines. Your condition will determine how often and how long these sessions last. In phase two, you may learn how to cook healthy meals, control your blood sugar, and manage your medicines. You may need help with scheduling or planning how and when to take your medicines. If you have questions about your medicines, it is very important that you talk to your health care provider. Phase Three This phase continues for the rest of your life. There will be less supervision. You may still participate in cardiac rehabilitation activities or become part of a group in your community. You may benefit from talking about your experience with other people who  are facing similar challenges. Get help right away if:  You have severe chest discomfort, especially if the pain is crushing or pressure-like and spreads to your arms, back, neck, or jaw. Do not wait to see if the pain will go  away.  You have weakness or numbness in your face, arms, or legs, especially on one side of the body.  Your speech is slurred.  You are confused.  You have a sudden severe headache or loss of vision.  You have shortness of breath.  You are sweating and have nausea.  You feel dizzy or faint.  You are fatigued. These symptoms may represent a serious problem that is an emergency. Do not wait to see if the symptoms will go away. Get medical help right away. Call your local emergency services (911 in the [place] Do not drive yourself to the hospital. This information is not intended to replace advice given to you by your health care provider. Make sure you discuss any questions you have with your health care provider. Document Released: 09/19/2008 Document Revised: 11/27/2016 Document Reviewed: 10/25/2015 Elsevier Interactive Patient Education  2018 [place].   Acute Coronary Syndrome Acute coronary syndrome (ACS) is a serious problem in which there is suddenly not enough blood and oxygen reaching the heart. ACS can result in chest pain or a heart attack. What are the causes? This condition may be caused by:  A buildup of fat and cholesterol inside of the arteries (atherosclerosis). This is the most common cause. The buildup (plaque) can cause the blood vessels in your heart (coronary arteries) to become narrow or blocked. Plaque can also break off to form a clot.  A coronary spasm.  A tearing of the coronary artery (spontaneous coronary artery dissection).  Low blood pressure (hypotension).  An abnormal heart beat (arrhythmia).  Using cocaine or methamphetamine.  What increases the risk? The following factors may make you more likely to develop this condition:  Age.  History of chest pain, heart attack, or stroke.  Being male.  Family history of chest pain, heart disease, or stroke.  Smoking.  Inactivity.  Being overweight.  High cholesterol.  High blood  pressure (hypertension).  Diabetes.  Excessive alcohol use.  What are the signs or symptoms? Common symptoms of this condition include:  Chest pain. The pain may last long, or may stop and come back (recur). It may feel like: ? Crushing or squeezing. ? Tightness, pressure, fullness, or heaviness.  Arm, neck, jaw, or back pain.  Heartburn or indigestion.  Shortness of breath.  Nausea.  Sudden cold sweats.  Lightheadedness.  Dizziness.  Tiredness (fatigue).  Sometimes there are no symptoms. How is this diagnosed? This condition may be diagnosed through:  An electrocardiogram (ECG). This test records the impulses of the heart.  Blood tests.  A CT scan of the chest.  A coronary angiogram. This procedure checks for a blockage in the coronary arteries.  How is this treated? Treatment for this condition may include:  Oxygen.  Medicines, such as: ? Antiplatelet medicines and blood-thinning medicines, such as aspirin. These help prevent blood clots. ? Fibrinolytic therapy. This breaks apart a blood clot. ? Blood pressure medicines. ? Nitroglycerin. ? Pain medicine. ? Cholesterol medicine.  A procedure called coronary angioplasty and stenting. This is done to widen a narrowed artery and keep it open.  Coronary artery bypass surgery. This allows blood to pass the blockage to reach your heart.  Cardiac rehabilitation. This is a program that helps improve  your health and well-being. It includes exercise training, education, and counseling to help you recover.  Follow these instructions at home: Eating and drinking  Follow a heart-healthy, low-salt (sodium) diet.  Use healthy cooking methods such as roasting, grilling, broiling, baking, poaching, steaming, or stir-frying.  Talk to a dietitian to learn about healthy cooking methods and how to eat less sodium. Medicines  Take over-the-counter and prescription medicines only as told by your health care  provider.  Do not take these medicines unless your health care provider approves: ? Nonsteroidal anti-inflammatory drugs (NSAIDs), such as ibuprofen, naproxen, or celecoxib. ? Vitamin supplements that contain vitamin A or vitamin E. ? Hormone replacement therapy that contains estrogen. Activity  Join a cardiac rehabilitation program.  Ask your health care provider: ? What activities and exercises are safe for you. ? If you should follow specific instructions about lifting, driving, or climbing stairs.  If you are taking aspirin and another blood thinning medicine, avoid activities that are likely to result in an injury. The medicines can increase your risk of bleeding. Lifestyle  Do not use any products that contain nicotine or tobacco, such as cigarettes and e-cigarettes. If you need help quitting, ask your health care provider.  If you drink alcohol and your health care provider says it is okay to drink, limit your alcohol intake to no more than 1 drink per day. One drink equals 12 oz of beer, 5 oz of wine, or 1 oz of hard liquor.  Maintain a healthy weight. If you need to lose weight, do it in a way that has been approved by your health care provider. General instructions  Tell all your health care providers about your heart condition, including your dentist. Some medicines can increase your risk of arrhythmia.  Manage other health conditions, such as hypertension and diabetes. These conditions affect your heart.  Learn ways to manage stress.  Get screened for depression, and seek treatment if needed.  Monitor your blood pressure if told by your health care provider.  Keep your vaccinations up to date. Get the annual influenza vaccine.  Keep all follow-up visits as told by your health care provider. This is important. Contact a health care provider if:  You feel overwhelmed or sad.  You have trouble with your daily activities. Get help right away if:  You have pain in  your chest, neck, arm, jaw, stomach, or back that recurs, and: ? Lasts more than a few minutes. ? Is not relieved by taking the [facility] health care provider prescribed.  You have unexplained: ? Heavy sweating. ? Heartburn or indigestion. ? Shortness of breath. ? Difficulty breathing. ? Nausea or vomiting. ? Fatigue. ? Nervousness or anxiety. ? Weakness. ? Diarrhea. ? Dark stools or blood in the stool.  You have sudden lightheadedness or dizziness.  Your blood pressure is higher than 180/120  You faint.  You feel like hurting yourself or think about taking your own life. These symptoms may represent a serious problem that is an emergency. Do not wait to see if the symptoms will go away. Get medical help right away. Call your local emergency services (911 in the [place] Do not drive yourself to the clinic or hospital. Summary  Acute coronary syndrome (ACS) is a when there is not enough blood and oxygen being supplied to the heart. ACS can result in chest pain or a heart attack.  Acute coronary syndrome is a medical emergency. If you have any symptoms of this condition, get help  right away.  Treatment includes oxygen, medicines, and procedures to open the blocked arteries and restore blood flow. This information is not intended to replace advice given to you by your health care provider. Make sure you discuss any questions you have with your health care provider. Document Released: 12/11/2005 Document Revised: 01/12/2017 Document Reviewed: 01/12/2017 Elsevier Interactive Patient Education  2018 [employer].   Coronary Angiogram A coronary angiogram is an X-ray procedure that is used to examine the arteries in the heart. In this procedure, a dye (contrast dye) is injected through a long, thin tube (catheter). The catheter is inserted through the groin, wrist, or arm. The dye is injected into each artery, then X-rays are taken to show if there is a blockage in the arteries of  the heart. This procedure can also show if you have valve disease or a disease of the aorta, and it can be used to check the overall function of your heart muscle. You may have a coronary angiogram if:  You are having chest pain, or other symptoms of angina, and you are at risk for heart disease.  You have an abnormal electrocardiogram (ECG) or stress test.  You have chest pain and heart failure.  You are having irregular heart rhythms.  You and your health care provider determine that the benefits of the test information outweigh the risks of the procedure.  Let your health care provider know about:  Any allergies you have, including allergies to contrast dye.  All medicines you are taking, including vitamins, herbs, eye drops, creams, and over-the-counter medicines.  Any problems you or family members have had with anesthetic medicines.  Any blood disorders you have.  Any surgeries you have had.  History of kidney problems or kidney failure.  Any medical conditions you have.  Whether you are pregnant or may be pregnant. What are the risks? Generally, this is a safe procedure. However, problems may occur, including:  Infection.  Allergic reaction to medicines or dyes that are used.  Bleeding from the access site or other locations.  Kidney injury, especially in people with impaired kidney function.  Stroke (rare).  Heart attack (rare).  Damage to other structures or organs.  What happens before the procedure? Staying hydrated Follow instructions from your health care provider about hydration, which may include:  Up to 2 hours before the procedure - you may continue to drink clear liquids, such as water, clear fruit juice, black coffee, and plain tea.  Eating and drinking restrictions Follow instructions from your health care provider about eating and drinking, which may include:  8 hours before the procedure - stop eating heavy meals or foods such as meat,  fried foods, or fatty foods.  6 hours before the procedure - stop eating light meals or foods, such as toast or cereal.  2 hours before the procedure - stop drinking clear liquids.  General instructions  Ask your health care provider about: ? Changing or stopping your regular medicines. This is especially important if you are taking diabetes medicines or blood thinners. ? Taking medicines such as ibuprofen. These medicines can thin your blood. Do not take these medicines before your procedure if your health care provider instructs you not to, though aspirin may be recommended prior to coronary angiograms.  Plan to have someone take you home from the hospital or clinic.  You may need to have blood tests or X-rays done. What happens during the procedure?  An IV tube will be inserted into one  of your veins.  You will be given one or more of the following: ? A medicine to help you relax (sedative). ? A medicine to numb the area where the catheter will be inserted into an artery (local anesthetic).  To reduce your risk of infection: ? Your health care team will wash or sanitize their hands. ? Your skin will be washed with soap. ? Hair may be removed from the area where the catheter will be inserted.  You will be connected to a continuous ECG monitor.  The catheter will be inserted into an artery. The location may be in your groin, in your wrist, or in the fold of your arm (near your elbow).  A type of X-ray (fluoroscopy) will be used to help guide the catheter to the opening of the blood vessel that is being examined.  A dye will be injected into the catheter, and X-rays will be taken. The dye will help to show where any narrowing or blockages are located in the heart arteries.  Tell your health care provider if you have any chest pain or trouble breathing during the procedure.  If blockages are found, your health care provider may perform another procedure, such as inserting a  coronary stent. The procedure may vary among health care providers and hospitals. What happens after the procedure?  After the procedure, you will need to keep the area still for a few hours, or for as long as told by your health care provider. If the procedure is done through the groin, you will be instructed to not bend and not cross your legs.  The insertion site will be checked frequently.  The pulse in your foot or wrist will be checked frequently.  You may have additional blood tests, X-rays, and a test that records the electrical activity of your heart (ECG).  Do not drive for 24 hours if you were given a sedative. Summary  A coronary angiogram is an X-ray procedure that is used to look into the arteries in the heart.  During the procedure, a dye (contrast dye) is injected through a long, thin tube (catheter). The catheter is inserted through the groin, wrist, or arm.  Tell your health care provider about any allergies you have, including allergies to contrast dye.  After the procedure, you will need to keep the area still for a few hours, or for as long as told by your health care provider. This information is not intended to replace advice given to you by your health care provider. Make sure you discuss any questions you have with your health care provider. Document Released: 06/17/2003 Document Revised: 09/22/2016 Document Reviewed: 09/22/2016 Elsevier Interactive Patient Education  2018 [place].   Femoral Site Care Refer to this sheet in the next few weeks. These instructions provide you with information about caring for yourself after your procedure. Your health care provider may also give you more specific instructions. Your treatment has been planned according to current medical practices, but problems sometimes occur. Call your health care provider if you have any problems or questions after your procedure. What can I expect after the procedure? After your  procedure, it is typical to have the following:  Bruising at the site that usually fades within 1-2 weeks.  Blood collecting in the tissue (hematoma) that may be painful to the touch. It should usually decrease in size and tenderness within 1-2 weeks.  Follow these instructions at home:  Take medicines only as directed by your health care  provider.  You may shower 24-48 hours after the procedure or as directed by your health care provider. Remove the bandage (dressing) and gently wash the site with plain soap and water. Pat the area dry with a clean towel. Do not rub the site, because this may cause bleeding.  Do not take baths, swim, or use a hot tub until your health care provider approves.  Check your insertion site every day for redness, swelling, or drainage.  Do not apply powder or lotion to the site.  Limit use of stairs to twice a day for the first 2-3 days or as directed by your health care provider.  Do not squat for the first 2-3 days or as directed by your health care provider.  Do not lift over 10 lb (4.5 kg) for 5 days after your procedure or as directed by your health care provider.  Ask your health care provider when it is okay to: ? Return to work or school. ? Resume usual physical activities or sports. ? Resume sexual activity.  Do not drive home if you are discharged the same day as the procedure. Have someone else drive you.  You may drive 24 hours after the procedure unless otherwise instructed by your health care provider.  Do not operate machinery or power tools for 24 hours after the procedure or as directed by your health care provider.  If your procedure was done as an outpatient procedure, which means that you went home the same day as your procedure, a responsible adult should be with you for the first 24 hours after you arrive home.  Keep all follow-up visits as directed by your health care provider. This is important. Contact a health care provider  if:  You have a fever.  You have chills.  You have increased bleeding from the site. Hold pressure on the site. Get help right away if:  You have unusual pain at the site.  You have redness, warmth, or swelling at the site.  You have drainage (other than a small amount of blood on the dressing) from the site.  The site is bleeding, and the bleeding does not stop after 30 minutes of holding steady pressure on the site.  Your leg or foot becomes pale, cool, tingly, or numb. This information is not intended to replace advice given to you by your health care provider. Make sure you discuss any questions you have with your health care provider. Document Released: 08/14/2014 Document Revised: 05/18/2016 Document Reviewed: 06/30/2014 Elsevier Interactive Patient Education  [employer].

## 2018-08-09 NOTE — Care Management CC44 (Signed)
Condition Code 44 Documentation Completed  Patient Details  Name: ARAGORN RECKER MRN: [id number] Date of Birth: 1951-06-12   Condition Code 44 given:  Yes Patient signature on Condition Code 44 notice:  Yes Documentation of 2 MD's agreement:  Yes Code 44 added to claim:  Yes    Katrina Stack, RN 08/09/2018, 10:44 AM

## 2018-08-09 NOTE — Discharge Summary (Signed)
[facility] NAME: Derrick Gallegos    MR#:  [id number]  DATE OF BIRTH:  04-09-1951  DATE OF ADMISSION:  08/08/2018 ADMITTING PHYSICIAN: Saundra Shelling, MD  DATE OF DISCHARGE: 08/09/2018  1:42 PM  PRIMARY CARE PHYSICIAN: [facility] [place]    ADMISSION DIAGNOSIS:  Unstable angina ([place]) [I20.0]  DISCHARGE DIAGNOSIS:  Active Problems:   Unstable angina (HCC)   CAD (coronary artery disease)   SECONDARY DIAGNOSIS:   Past Medical History:  Diagnosis Date  . Anxiety   . Arthritis   . Coronary artery disease    a. s/p 3-v cabg in 2004 at [place] (LIMA and radial arery used - no further details in [place])  . Depression   . Diabetes mellitus with complication ([place])   . Essential hypertension   . Family history of premature CAD    a. father passed away from an MI at 37  . HLD (hyperlipidemia)    a. statin intolerant   . Obesity     HOSPITAL COURSE:   67 year old male with past medical history of atrial fibrillation, coronary artery disease status post previous bypass, hypertension, hyperlipidemia, anxiety, obesity, diabetes who presented to the hospital due to an elective cardiac catheterization and noted to have significant coronary disease that needed intervention.  1.  Coronary artery disease status post bypass-patient presented to the hospital for an elective cardiac catheterization. - Post cardiac catheterization he was noted to have significant one-vessel coronary artery disease.  Patient was noted to have significant stenosis to his left circumflex.  He underwent drug-eluting stent to the distal circumflex. - Postprocedure patient is chest pain-free and hemodynamically stable. - Discussed with his cardiologist and patient is now being discharged on oral aspirin, Plavix, beta-blocker and ACE inhibitor.  He will follow-up with his cardiologist this coming Monday at 10 AM.  2.  History of atrial  fibrillation- prior to coming to the hospital patient was on Xarelto.  This was discontinued as patient was started on aspirin and Plavix due to his cardiac intervention as mentioned above. - He will continue metoprolol for rate control, his Xarelto is to be resumed as an outpatient by his cardiologist. -He will continue his oral amiodarone.  3.  Diabetes type 2 without complication-patient will resume his glipizide.  4.  Hyperlipidemia- given his recent cardiac intervention his atorvastatin dose was increased.  Been discharged on atorvastatin 80 mg daily.  DISCHARGE CONDITIONS:   STable  CONSULTS OBTAINED:  Treatment Team:  Wilhelmina Mcardle, MD  DRUG ALLERGIES:  No Known Allergies  DISCHARGE MEDICATIONS:   Allergies as of 08/09/2018   No Known Allergies     Medication List    STOP taking these medications   isosorbide mononitrate 30 MG 24 hr tablet Commonly known as:  IMDUR   metoprolol succinate 25 MG 24 hr tablet Commonly known as:  TOPROL-XL   nitroGLYCERIN 0.4 MG SL tablet Commonly known as:  NITROSTAT   pravastatin 20 MG tablet Commonly known as:  PRAVACHOL   rivaroxaban 20 MG Tabs tablet Commonly known as:  XARELTO     TAKE these medications   acetaminophen 500 MG tablet Commonly known as:  TYLENOL Take 1,000 mg by mouth every 6 (six) hours as needed for moderate pain or headache.   amiodarone 200 MG tablet Commonly known as:  PACERONE Take 200 mg by mouth every evening.   aspirin 81 MG EC tablet Take 1 tablet (81 mg total)  by mouth daily. Start taking on:  08/10/2018   atorvastatin 80 MG tablet Commonly known as:  LIPITOR Take 1 tablet (80 mg total) by mouth daily at 6 PM.   clopidogrel 75 MG tablet Commonly known as:  PLAVIX Take 1 tablet (75 mg total) by mouth daily with breakfast. Start taking on:  08/10/2018   FISH OIL PO Take 2 capsules by mouth every evening.   glipiZIDE 5 MG 24 hr tablet Commonly known as:  GLUCOTROL XL Take 5 mg by  mouth every evening.   lisinopril 20 MG tablet Commonly known as:  PRINIVIL,ZESTRIL Take 1 tablet (20 mg total) by mouth daily. Start taking on:  08/10/2018   metoprolol tartrate 25 MG tablet Commonly known as:  LOPRESSOR Take 1 tablet (25 mg total) by mouth 2 (two) times daily.   VITAMIN D3 PO Take 1 capsule by mouth daily.   VITAMIN E PO Take 1 capsule by mouth daily.         DISCHARGE INSTRUCTIONS:   DIET:  Cardiac diet and Diabetic diet  DISCHARGE CONDITION:  Stable  ACTIVITY:  Activity as tolerated  OXYGEN:  Home Oxygen: No.   Oxygen Delivery: room air  DISCHARGE LOCATION:  home   If you experience worsening of your admission symptoms, develop shortness of breath, life threatening emergency, suicidal or homicidal thoughts you must seek medical attention immediately by calling 911 or calling your MD immediately  if symptoms less severe.  You Must read complete instructions/literature along with all the possible adverse reactions/side effects for all the Medicines you take and that have been prescribed to you. Take any new Medicines after you have completely understood and accpet all the possible adverse reactions/side effects.   Please note  You were cared for by a hospitalist during your hospital stay. If you have any questions about your discharge medications or the care you received while you were in the hospital after you are discharged, you can call the unit and asked to speak with the hospitalist on call if the hospitalist that took care of you is not available. Once you are discharged, your primary care physician will handle any further medical issues. Please note that NO REFILLS for any discharge medications will be authorized once you are discharged, as it is imperative that you return to your primary care physician (or establish a relationship with a primary care physician if you do not have one) for your aftercare needs so that they can reassess your need  for medications and monitor your lab values.     Today   No chest pains, nausea, vomiting and no other associated symptoms.  Will discharge home today.  Discussed with his cardiologist Dr. Humphrey Rolls.   VITAL SIGNS:  Blood pressure (!) 153/83, pulse (!) 56, temperature 98.3 F (36.8 C), temperature source Axillary, resp. rate 12, height 5\' 11"  (1.803 m), weight 122.8 kg, SpO2 99 %.  I/O:    Intake/Output Summary (Last 24 hours) at 08/09/2018 1651 Last data filed at 08/09/2018 1034 Gross per 24 hour  Intake 974.41 ml  Output 1730 ml  Net -755.59 ml    PHYSICAL EXAMINATION:  GENERAL:  67 y.o.-year-old patient lying in the bed with no acute distress.  EYES: Pupils equal, round, reactive to light and accommodation. No scleral icterus. Extraocular muscles intact.  HEENT: Head atraumatic, normocephalic. Oropharynx and nasopharynx clear.  NECK:  Supple, no jugular venous distention. No thyroid enlargement, no tenderness.  LUNGS: Normal breath sounds bilaterally, no wheezing, rales,rhonchi. No  use of accessory muscles of respiration.  CARDIOVASCULAR: S1, S2 normal. No murmurs, rubs, or gallops.  ABDOMEN: Soft, non-tender, non-distended. Bowel sounds present. No organomegaly or mass.  EXTREMITIES: No pedal edema, cyanosis, or clubbing.  NEUROLOGIC: Cranial nerves II through XII are intact. No focal motor or sensory defecits b/l.  PSYCHIATRIC: The patient is alert and oriented x 3.   SKIN: No obvious rash, lesion, or ulcer.   DATA REVIEW:   CBC Recent Labs  Lab 08/09/18 0310  WBC 7.0  HGB 13.4  HCT 37.9*  PLT 268    Chemistries  Recent Labs  Lab 08/09/18 0310  NA 139  K 4.5  CL 104  CO2 27  GLUCOSE 141*  BUN 16  CREATININE 1.00  CALCIUM 8.5*  MG 2.0    Cardiac Enzymes Recent Labs  Lab 08/09/18 0310  TROPONINI 0.03*    Microbiology Results  Results for orders placed or performed during the hospital encounter of 08/08/18  MRSA PCR Screening     Status: None    Collection Time: 08/08/18  3:27 PM  Result Value Ref Range Status   MRSA by PCR NEGATIVE NEGATIVE Final    Comment:        The GeneXpert MRSA Assay (FDA approved for NASAL specimens only), is one component of a comprehensive MRSA colonization surveillance program. It is not intended to diagnose MRSA infection nor to guide or monitor treatment for MRSA infections. Performed at [facility], [address] [place], [place] [address]     RADIOLOGY:  No results found.    Management plans discussed with the patient, family and they are in agreement.  CODE STATUS:  Code Status History    Date Active Date Inactive Code Status Order ID Comments User Context   08/08/2018 1516 08/09/2018 1648 Full Code [id number]  Saundra Shelling, MD Inpatient    TOTAL TIME TAKING CARE OF THIS PATIENT: 40 minutes.    Henreitta Leber M.D on 08/09/2018 at 4:51 PM  Between 7am to 6pm - Pager - [phone]  After 6pm go to [email or web address] - [facility]  Office  [phone]  CC: Primary care physician; [facility] [place]

## 2018-08-09 NOTE — Care Management Obs Status (Signed)
[name] NOTIFICATION   Patient Details  Name: Derrick Gallegos MRN: [id number] Date of Birth: [birth year]   Medicare Observation Status Notification Given:  Yes  Unable to open [place] on laptop or COW in room.  CM printed 2 notices and had patient sign both.  One taken directly to medical records to be scanned into the record and the other left with patient  Katrina Stack, RN 08/09/2018, 10:38 AM

## 2018-08-09 NOTE — Progress Notes (Signed)
SUBJECTIVE: Patient doing well   Vitals:   08/09/18 0200 08/09/18 0300 08/09/18 0400 08/09/18 0500  BP:  124/73 120/68 128/78  Pulse: (!) 56 (!) 56 (!) 56 (!) 56  Resp: 16 11 12  (!) 22  Temp:      TempSrc:      SpO2: 98% 95% 97% 99%  Weight:      Height:        Intake/Output Summary (Last 24 hours) at 08/09/2018 0838 Last data filed at 08/09/2018 0654 Gross per 24 hour  Intake 614.41 ml  Output 1730 ml  Net -1115.59 ml    LABS: Basic Metabolic Panel: Recent Labs    08/08/18 1046 08/09/18 0310  NA 137 139  K 4.1 4.5  CL 103 104  CO2 25 27  GLUCOSE 123* 141*  BUN 14 16  CREATININE 0.83 1.00  CALCIUM 9.3 8.5*  MG  --  2.0  PHOS  --  3.3   Liver Function Tests: No results for input(s): AST, ALT, ALKPHOS, BILITOT, PROT, ALBUMIN in the last 72 hours. No results for input(s): LIPASE, AMYLASE in the last 72 hours. CBC: Recent Labs    08/08/18 1046 08/09/18 0310  WBC 7.4 7.0  HGB 14.7 13.4  HCT 41.5 37.9*  MCV 93.2 93.2  PLT 327 268   Cardiac Enzymes: Recent Labs    08/08/18 1533 08/08/18 2110 08/09/18 0310  TROPONINI <0.03 <0.03 0.03*   BNP: Invalid input(s): POCBNP D-Dimer: No results for input(s): DDIMER in the last 72 hours. Hemoglobin A1C: No results for input(s): HGBA1C in the last 72 hours. Fasting Lipid Panel: Recent Labs    08/09/18 0310  CHOL 203*  HDL 35*  LDLCALC UNABLE TO CALCULATE IF TRIGLYCERIDE OVER 400 mg/dL  TRIG 579*  CHOLHDL 5.8   Thyroid Function Tests: No results for input(s): TSH, T4TOTAL, T3FREE, THYROIDAB in the last 72 hours.  Invalid input(s): FREET3 Anemia Panel: No results for input(s): VITAMINB12, FOLATE, FERRITIN, TIBC, IRON, RETICCTPCT in the last 72 hours.   PHYSICAL EXAM General: Well developed, well nourished, in no acute distress HEENT:  Normocephalic and atramatic Neck:  No JVD.  Lungs: Clear bilaterally to auscultation and percussion. Heart: HRRR . Normal S1 and S2 without gallops or murmurs.   Abdomen: Bowel sounds are positive, abdomen soft and non-tender  Msk:  Back normal, normal gait. Normal strength and tone for age. Extremities: No clubbing, cyanosis or edema.   Neuro: Alert and oriented X 3. Psych:  Good affect, responds appropriately  TELEMETRY: NSR  ASSESSMENT AND PLAN: s/p PCI/DE of distal and proximal LCX, doing well.go home on asprin 81 and plavix 75 mg daily with f/u Monday at 10 am.  Active Problems:   Unstable angina (HCC)   CAD (coronary artery disease)    Dionisio David, MD, [facility] 08/09/2018 8:38 AM

## 2018-08-09 NOTE — Care Management (Signed)
Patient had elective cardiac cath with intervention. No complications. Provided patient with Brilinta 30 day trial coupon.  He confirms he had part D coverage with his medicare plan.

## 2018-08-15 ENCOUNTER — Encounter: Payer: Self-pay | Admitting: Physician Assistant

## 2018-11-27 ENCOUNTER — Encounter: Payer: Self-pay | Admitting: Emergency Medicine

## 2018-11-27 ENCOUNTER — Emergency Department: Payer: Medicare Other

## 2018-11-27 ENCOUNTER — Inpatient Hospital Stay
Admission: EM | Admit: 2018-11-27 | Discharge: 2018-11-28 | DRG: 303 | Disposition: A | Discharge status: Home / Self Care | Payer: Medicare Other | Attending: Internal Medicine | Admitting: Internal Medicine

## 2018-11-27 ENCOUNTER — Other Ambulatory Visit: Payer: Self-pay

## 2018-11-27 DIAGNOSIS — F419 Anxiety disorder, unspecified: Secondary | ICD-10-CM | POA: Diagnosis present

## 2018-11-27 DIAGNOSIS — Z87891 Personal history of nicotine dependence: Secondary | ICD-10-CM | POA: Diagnosis not present

## 2018-11-27 DIAGNOSIS — E785 Hyperlipidemia, unspecified: Secondary | ICD-10-CM | POA: Diagnosis present

## 2018-11-27 DIAGNOSIS — E669 Obesity, unspecified: Secondary | ICD-10-CM | POA: Diagnosis present

## 2018-11-27 DIAGNOSIS — Z951 Presence of aortocoronary bypass graft: Secondary | ICD-10-CM

## 2018-11-27 DIAGNOSIS — Z7984 Long term (current) use of oral hypoglycemic drugs: Secondary | ICD-10-CM | POA: Diagnosis not present

## 2018-11-27 DIAGNOSIS — E119 Type 2 diabetes mellitus without complications: Secondary | ICD-10-CM | POA: Diagnosis present

## 2018-11-27 DIAGNOSIS — I25110 Atherosclerotic heart disease of native coronary artery with unstable angina pectoris: Secondary | ICD-10-CM | POA: Diagnosis present

## 2018-11-27 DIAGNOSIS — R0609 Other forms of dyspnea: Secondary | ICD-10-CM

## 2018-11-27 DIAGNOSIS — I252 Old myocardial infarction: Secondary | ICD-10-CM

## 2018-11-27 DIAGNOSIS — I10 Essential (primary) hypertension: Secondary | ICD-10-CM | POA: Diagnosis present

## 2018-11-27 DIAGNOSIS — Z8249 Family history of ischemic heart disease and other diseases of the circulatory system: Secondary | ICD-10-CM | POA: Diagnosis not present

## 2018-11-27 DIAGNOSIS — I480 Paroxysmal atrial fibrillation: Secondary | ICD-10-CM | POA: Diagnosis present

## 2018-11-27 DIAGNOSIS — Z955 Presence of coronary angioplasty implant and graft: Secondary | ICD-10-CM | POA: Diagnosis not present

## 2018-11-27 DIAGNOSIS — F329 Major depressive disorder, single episode, unspecified: Secondary | ICD-10-CM | POA: Diagnosis present

## 2018-11-27 DIAGNOSIS — Z79899 Other long term (current) drug therapy: Secondary | ICD-10-CM | POA: Diagnosis not present

## 2018-11-27 DIAGNOSIS — R0600 Dyspnea, unspecified: Secondary | ICD-10-CM

## 2018-11-27 DIAGNOSIS — R0789 Other chest pain: Secondary | ICD-10-CM | POA: Diagnosis present

## 2018-11-27 DIAGNOSIS — R079 Chest pain, unspecified: Secondary | ICD-10-CM | POA: Diagnosis present

## 2018-11-27 HISTORY — DX: Acute myocardial infarction, unspecified: I21.9

## 2018-11-27 LAB — CBC WITH DIFFERENTIAL/PLATELET
Abs Immature Granulocytes: 0.15 10*3/uL — ABNORMAL HIGH (ref 0.00–0.07)
Basophils Absolute: 0.1 10*3/uL (ref 0.0–0.1)
Basophils Relative: 1 %
Eosinophils Absolute: 0.3 10*3/uL (ref 0.0–0.5)
Eosinophils Relative: 3 %
HCT: 44.8 % (ref 39.0–52.0)
Hemoglobin: 15.7 g/dL (ref 13.0–17.0)
Immature Granulocytes: 2 %
Lymphocytes Relative: 32 %
Lymphs Abs: 3.2 10*3/uL (ref 0.7–4.0)
MCH: 32 pg (ref 26.0–34.0)
MCHC: 35 g/dL (ref 30.0–36.0)
MCV: 91.4 fL (ref 80.0–100.0)
Monocytes Absolute: 0.9 10*3/uL (ref 0.1–1.0)
Monocytes Relative: 9 %
Neutro Abs: 5.4 10*3/uL (ref 1.7–7.7)
Neutrophils Relative %: 53 %
Platelets: 344 10*3/uL (ref 150–400)
RBC: 4.9 MIL/uL (ref 4.22–5.81)
RDW: 12.6 % (ref 11.5–15.5)
WBC: 9.9 10*3/uL (ref 4.0–10.5)
nRBC: 0 % (ref 0.0–0.2)

## 2018-11-27 LAB — COMPREHENSIVE METABOLIC PANEL
ALT: 23 U/L (ref 0–44)
AST: 24 U/L (ref 15–41)
Albumin: 4.6 g/dL (ref 3.5–5.0)
Alkaline Phosphatase: 73 U/L (ref 38–126)
Anion gap: 10 (ref 5–15)
BUN: 19 mg/dL (ref 8–23)
CO2: 27 mmol/L (ref 22–32)
Calcium: 9.4 mg/dL (ref 8.9–10.3)
Chloride: 97 mmol/L — ABNORMAL LOW (ref 98–111)
Creatinine, Ser: 1.15 mg/dL (ref 0.61–1.24)
GFR calc Af Amer: >60 mL/min (ref >60)
GFR calc non Af Amer: >60 mL/min (ref >60)
Glucose, Bld: 208 mg/dL — ABNORMAL HIGH (ref 70–99)
Potassium: 4.5 mmol/L (ref 3.5–5.1)
Sodium: 134 mmol/L — ABNORMAL LOW (ref 135–145)
Total Bilirubin: 0.6 mg/dL (ref 0.3–1.2)
Total Protein: 7.7 g/dL (ref 6.5–8.1)

## 2018-11-27 LAB — TROPONIN I: Troponin I: <0.03 ng/mL (ref <0.03)

## 2018-11-27 NOTE — ED Triage Notes (Signed)
Patient to ER for c/o "rib cage pain" and pain to upper back. Patient states he has h/o MI (did not have chest pain with previous MI). Patient states he developed upper back pain a few days ago; +diaphoresis, +dizziness today. Patient diaphoretic currently. Patient states he is unable to walk "even 30 feet without getting out of breath".

## 2018-11-27 NOTE — ED Provider Notes (Signed)
[facility] Emergency Department Provider Note   ____________________________________________   First MD Initiated Contact with Patient 11/27/18 2349     (approximate)  I have reviewed the triage vital signs and the nursing notes.   HISTORY  Chief Complaint Chest Pain    HPI Derrick Gallegos is a 67 y.o. male patient reports pain in his upper back and chest.  He had an MI and a stent done here in August.  In 2004 he also had a three-vessel CABG.  Patient complains of extreme shortness of breath when he lays down and he says he cannot walk more than 20 or 30 feet without getting out of breath and having to stop.  Of note he did not have chest pain with his previous MI.  This shortness of breath has been going on for about the last week or 2.  Is getting worse.  Patient also reports epigastric pain when he lays down but not with exertion.  Past Medical History:  Diagnosis Date  . Anxiety   . Arthritis   . Coronary artery disease    a. s/p 3-v cabg in 2004 at [place] (LIMA and radial arery used - no further details in [place])  . Depression   . Diabetes mellitus with complication ([place])   . Essential hypertension   . Family history of premature CAD    a. father passed away from an MI at 84  . HLD (hyperlipidemia)    a. statin intolerant   . MI (myocardial infarction) ([place])   . Obesity     Patient Active Problem List   Diagnosis Date Noted  . Unstable angina ([place]) 08/08/2018  . CAD (coronary artery disease) 08/08/2018  . Chest pain 11/16/2017  . Coronary artery disease   . Diabetes mellitus with complication ([place])   . Essential hypertension   . Family history of premature CAD   . HLD (hyperlipidemia)   . Obesity     Past Surgical History:  Procedure Laterality Date  . CARDIAC SURGERY    . CARDIOVERSION N/A 06/20/2018   Procedure: CARDIOVERSION;  Surgeon: Dionisio David, MD;  Location: [facility] ORS;  Service: Cardiovascular;  Laterality: N/A;    . CORONARY STENT INTERVENTION N/A 08/08/2018   Procedure: CORONARY STENT INTERVENTION;  Surgeon: Isaias Cowman, MD;  Location: [place] CV LAB;  Service: Cardiovascular;  Laterality: N/A;  . LEFT HEART CATH AND CORONARY ANGIOGRAPHY Left 08/08/2018   Procedure: LEFT HEART CATH AND CORONARY ANGIOGRAPHY;  Surgeon: Dionisio David, MD;  Location: [place] CV LAB;  Service: Cardiovascular;  Laterality: Left;  . TEE WITHOUT CARDIOVERSION N/A 06/20/2018   Procedure: TRANSESOPHAGEAL ECHOCARDIOGRAM (TEE);  Surgeon: Dionisio David, MD;  Location: [facility] ORS;  Service: Cardiovascular;  Laterality: N/A;    Prior to Admission medications   Medication Sig Start Date End Date Taking? Authorizing Provider  acetaminophen (TYLENOL) 500 MG tablet Take 1,000 mg by mouth every 6 (six) hours as needed for moderate pain or headache.     [provider]  amiodarone (PACERONE) 200 MG tablet Take 200 mg by mouth every evening.    [provider]  atorvastatin (LIPITOR) 80 MG tablet Take 1 tablet (80 mg total) by mouth daily at 6 PM. 08/09/18 10/08/18  Henreitta Leber, MD  Cholecalciferol (VITAMIN D3 PO) Take 1 capsule by mouth daily.    [provider]  glipiZIDE (GLUCOTROL XL) 5 MG 24 hr tablet Take 5 mg by mouth every evening.  [provider]  lisinopril (PRINIVIL,ZESTRIL) 20 MG tablet Take 1 tablet (20 mg total) by mouth daily. 08/10/18 10/09/18  Henreitta Leber, MD  metoprolol tartrate (LOPRESSOR) 25 MG tablet Take 1 tablet (25 mg total) by mouth 2 (two) times daily. 08/09/18 10/08/18  Henreitta Leber, MD  Omega-3 Fatty Acids (FISH OIL PO) Take 2 capsules by mouth every evening.     [provider]  VITAMIN E PO Take 1 capsule by mouth daily.    [provider]    Allergies Patient has no known allergies.  Family History  Problem Relation Age of Onset  . CAD Mother   . CAD Father        a. MI at 57-->death  . CAD Brother     Social  History Social History   Tobacco Use  . Smoking status: Former Smoker    Types: Cigarettes  . Smokeless tobacco: Never Used  Substance Use Topics  . Alcohol use: No  . Drug use: No    Review of Systems  Constitutional: No fever/chills Eyes: No visual changes. ENT: No sore throat. Cardiovascular: See HPI Respiratory: See HPI Gastrointestinal: No abdominal pain.  No nausea, no vomiting.  No diarrhea.  No constipation. Genitourinary: Negative for dysuria. Musculoskeletal: Negative for back pain. Skin: Negative for rash. Neurological: Negative for headaches, focal weakness   ____________________________________________   PHYSICAL EXAM:  VITAL SIGNS: ED Triage Vitals  Enc Vitals Group     BP 11/27/18 2054 (!) 155/90     Pulse Rate 11/27/18 2054 72     Resp 11/27/18 2054 20     Temp 11/27/18 2054 98.1 F (36.7 C)     Temp Source 11/27/18 2054 Oral     SpO2 11/27/18 2054 97 %     Weight 11/27/18 2051 260 lb (117.9 kg)     Height 11/27/18 2051 5\' 11"  (1.803 m)     Head Circumference --      Peak Flow --      Pain Score 11/27/18 2051 3     Pain Loc --      Pain Edu? --      Excl. in [place]? --     Constitutional: Alert and oriented. Well appearing and in no acute distress. Eyes: Conjunctivae are normal. PERRL. EOMI. Head: Atraumatic. Nose: No congestion/rhinnorhea. Mouth/Throat: Mucous membranes are moist.  Oropharynx non-erythematous. Neck: No stridor.   Cardiovascular: Normal rate, regular rhythm. Grossly normal heart sounds.  Good peripheral circulation. Respiratory: Normal respiratory effort.  No retractions. Lungs CTAB. Gastrointestinal: Soft and nontender. No distention. No abdominal bruits. No CVA tenderness. Musculoskeletal: No lower extremity tenderness nor edema.  No joint effusions. Neurologic:  Normal speech and language. No gross focal neurologic deficits are appreciated.  Skin:  Skin is warm, dry and intact. No rash noted. Psychiatric: Mood and affect  are normal. Speech and behavior are normal.  ____________________________________________   LABS (all labs ordered are listed, but only abnormal results are displayed)  Labs Reviewed  CBC WITH DIFFERENTIAL/PLATELET - Abnormal; Notable for the following components:      Result Value   Abs Immature Granulocytes 0.15 (*)    All other components within normal limits  COMPREHENSIVE METABOLIC PANEL - Abnormal; Notable for the following components:   Sodium 134 (*)    Chloride 97 (*)    Glucose, Bld 208 (*)    All other components within normal limits  TROPONIN I   ____________________________________________  EKG  EKG read interpreted by  me shows normal sinus rhythm rate of 71 normal axis no acute ST-T wave changes ____________________________________________  RADIOLOGY  ED MD interpretation: X-ray looks good normal per radiology I reviewed the film  Official radiology report(s): Dg Chest 2 View  Result Date: 11/27/2018 CLINICAL DATA:  Rib cage pain and upper back pain starting several days ago. Dizziness and diaphoresis. EXAM: CHEST - 2 VIEW COMPARISON:  05/18/2018 FINDINGS: Normal heart size mediastinal contours. Median sternotomy sutures are again noted without significant change. No alveolar consolidation, edema, effusion or pneumothorax. Minimal left base scarring and/or atelectasis. No acute osseous abnormality. IMPRESSION: No active cardiopulmonary disease. Electronically Signed   By: Ashley Royalty M.D.   On: 11/27/2018 21:14    ____________________________________________   PROCEDURES  Procedure(s) performed:   Procedures  Critical Care performed:   ____________________________________________   INITIAL IMPRESSION / ASSESSMENT AND PLAN / ED CO  Patient with new onset and progressive shortness of breath history of coronary artery disease I will get him in the hospital worked him up for unstable angina which I think this may be an equivalent of.       ____________________________________________   FINAL CLINICAL IMPRESSION(S) / ED DIAGNOSES  Final diagnoses:  Unstable angina [facility])     ED Discharge Orders    None       Note:  This document was prepared using Dragon voice recognition software and may include unintentional dictation errors.    Nena Polio, MD 11/28/18 [phone]

## 2018-11-28 DIAGNOSIS — R0789 Other chest pain: Secondary | ICD-10-CM | POA: Diagnosis present

## 2018-11-28 DIAGNOSIS — F329 Major depressive disorder, single episode, unspecified: Secondary | ICD-10-CM | POA: Diagnosis not present

## 2018-11-28 DIAGNOSIS — I25110 Atherosclerotic heart disease of native coronary artery with unstable angina pectoris: Secondary | ICD-10-CM | POA: Diagnosis not present

## 2018-11-28 DIAGNOSIS — Z955 Presence of coronary angioplasty implant and graft: Secondary | ICD-10-CM | POA: Diagnosis not present

## 2018-11-28 DIAGNOSIS — Z87891 Personal history of nicotine dependence: Secondary | ICD-10-CM | POA: Diagnosis not present

## 2018-11-28 DIAGNOSIS — Z951 Presence of aortocoronary bypass graft: Secondary | ICD-10-CM | POA: Diagnosis not present

## 2018-11-28 DIAGNOSIS — I252 Old myocardial infarction: Secondary | ICD-10-CM | POA: Diagnosis not present

## 2018-11-28 DIAGNOSIS — F419 Anxiety disorder, unspecified: Secondary | ICD-10-CM | POA: Diagnosis not present

## 2018-11-28 DIAGNOSIS — Z7984 Long term (current) use of oral hypoglycemic drugs: Secondary | ICD-10-CM | POA: Diagnosis not present

## 2018-11-28 DIAGNOSIS — Z79899 Other long term (current) drug therapy: Secondary | ICD-10-CM | POA: Diagnosis not present

## 2018-11-28 DIAGNOSIS — E669 Obesity, unspecified: Secondary | ICD-10-CM | POA: Diagnosis not present

## 2018-11-28 DIAGNOSIS — E119 Type 2 diabetes mellitus without complications: Secondary | ICD-10-CM | POA: Diagnosis not present

## 2018-11-28 DIAGNOSIS — E785 Hyperlipidemia, unspecified: Secondary | ICD-10-CM | POA: Diagnosis not present

## 2018-11-28 DIAGNOSIS — Z8249 Family history of ischemic heart disease and other diseases of the circulatory system: Secondary | ICD-10-CM | POA: Diagnosis not present

## 2018-11-28 DIAGNOSIS — I10 Essential (primary) hypertension: Secondary | ICD-10-CM | POA: Diagnosis not present

## 2018-11-28 DIAGNOSIS — I480 Paroxysmal atrial fibrillation: Secondary | ICD-10-CM | POA: Diagnosis not present

## 2018-11-28 LAB — TROPONIN I: Troponin I: <0.03 ng/mL (ref <0.03)

## 2018-11-28 MED ORDER — METOPROLOL TARTRATE 25 MG PO TABS: 25.0000 mg | ORAL_TABLET | Freq: Two times a day (BID) | ORAL | 1 refills | Status: DC

## 2018-11-28 NOTE — ED Notes (Signed)
Report given to the floor. 

## 2018-11-28 NOTE — Discharge Instructions (Addendum)
As we discussed, you were offered admission today for further evaluation of your chest pain, but you and the hospitalist decided it would be appropriate to follow-up as an outpatient.  Please contact the office of Dr. Saunders Revel to follow-up with a new cardiologist as per your request (either Dr. Saunders Revel or one of his colleagues).  Please do not continue taking the sotalol and instead try back on your metoprolol as you discussed with Dr. Duane Boston (I wrote you a new prescription).  Please continue taking all of your other medications as written.  Return to the emergency department if you develop new or worsening symptoms that concern you.

## 2018-11-28 NOTE — ED Provider Notes (Signed)
-----------------------------------------   3:43 AM on 11/28/2018 -----------------------------------------  This patient was admitted to Dr. Duane Boston with the hospitalist service for further evaluation of his chest pain.  Dr. Duane Boston saw the patient and went over his history in detail and felt he might be appropriate for outpatient management.  The patient also verbalized to her that he did not want to stay in the hospital and he would like information about a new cardiologist to follow-up with as an outpatient due to some philosophical differences between him and Dr. Humphrey Rolls.  Due to difficulties with the hospitalists providing discharge instructions to the patient, Dr. Duane Boston asked me to facilitate his ED discharge.  His second troponin was negative.  I followed up with the patient and he is not having any chest pain and feels better in general.  He reiterated that he does not want to stay in the hospital unless he absolutely has to do so.  As per the discussion between him and Dr. Duane Boston, I have discontinued his sotalol and started him back on metoprolol.  I gave him information regarding other cardiologists with whom he can follow-up.  I gave strict return precautions if he develops any new or worsening symptoms and he understands and agrees with the plan.   Hinda Kehr, MD 11/28/18 [phone]

## 2018-11-28 NOTE — ED Notes (Signed)
Pt stated that he had a sharp chest pain that lasted about 1 minute. Denies any pain now or any other symptoms at this time.

## 2018-11-28 NOTE — Consult Note (Addendum)
[facility] Consultation  SHAMARION COOTS ALP:[id number] DOB: 1951/07/15 DOA: 11/27/2018 PCP: [facility] [place]   Requesting physician: Dr. Corinna Capra Date of consultation: 11/28/2018 Reason for consultation: Shortness of breath  CHIEF COMPLAINT: Shortness of breath with exertion  HISTORY OF PRESENT ILLNESS: Milind Raether  is a 67 y.o. male with a known history of diabetes type 2, atrial fibrillation, CAD, status post recent stent placement in August this year and status post CABG in 2004.  Most recent ejection fraction was 61%, per cardiac cath done in August 2019. Patient presented to emergency room for worsening shortness of breath with walking more than 30-40 feet, going on for the past 1-2 months.  Patient is compliant with his medications and the only new medication is sotalol, which was started in the past 3 months for atrial fibrillation.  Patient noticed fatigue and shortness of breath with exertion, gradually worsening in the past 2 months, after starting sotalol.  The shortness of breath he had in the past, before his heart attack was a lot more severe than his symptoms now. Patient denies any chest pain, palpitations, fever or chills.  No cough, no lower extremities edema. Blood test done emergency room, including CBC, CMP and troponin levels are grossly unremarkable. EKG shows normal sinus rhythm with heart rate at 71, normal axis, no acute ST-T changes. No active cardiopulmonary disease per chest x-ray. Internal medicine is called to evaluate the patient for further management.   PAST MEDICAL HISTORY:   Past Medical History:  Diagnosis Date  . Anxiety   . Arthritis   . Coronary artery disease    a. s/p 3-v cabg in 2004 at [place] (LIMA and radial arery used - no further details in [place])  . Depression   . Diabetes mellitus with complication ([place])   . Essential hypertension   . Family history of premature CAD    a. father passed away from  an MI at 58  . HLD (hyperlipidemia)    a. statin intolerant   . MI (myocardial infarction) ([place])   . Obesity     PAST SURGICAL HISTORY:  Past Surgical History:  Procedure Laterality Date  . CARDIAC SURGERY    . CARDIOVERSION N/A 06/20/2018   Procedure: CARDIOVERSION;  Surgeon: Dionisio David, MD;  Location: [facility] ORS;  Service: Cardiovascular;  Laterality: N/A;  . CORONARY STENT INTERVENTION N/A 08/08/2018   Procedure: CORONARY STENT INTERVENTION;  Surgeon: Isaias Cowman, MD;  Location: [facility];  Service: Cardiovascular;  Laterality: N/A;  . LEFT HEART CATH AND CORONARY ANGIOGRAPHY Left 08/08/2018   Procedure: LEFT HEART CATH AND CORONARY ANGIOGRAPHY;  Surgeon: Dionisio David, MD;  Location: [facility];  Service: Cardiovascular;  Laterality: Left;  . TEE WITHOUT CARDIOVERSION N/A 06/20/2018   Procedure: TRANSESOPHAGEAL ECHOCARDIOGRAM (TEE);  Surgeon: Dionisio David, MD;  Location: [facility] ORS;  Service: Cardiovascular;  Laterality: N/A;    SOCIAL HISTORY:  Social History   Tobacco Use  . Smoking status: Former Smoker    Types: Cigarettes  . Smokeless tobacco: Never Used  Substance Use Topics  . Alcohol use: No    FAMILY HISTORY:  Family History  Problem Relation Age of Onset  . CAD Mother   . CAD Father        a. MI at 57-->death  . CAD Brother     DRUG ALLERGIES: No Known Allergies  REVIEW OF SYSTEMS:   CONSTITUTIONAL: No fever, fatigue or weakness.  EYES:  No blurred or double vision.  EARS, NOSE, AND THROAT: No tinnitus or ear pain.  RESPIRATORY: No cough, shortness of breath, wheezing or hemoptysis.  CARDIOVASCULAR: Positive for shortness of breath with exertion, when walking more than 30 to 40 feet.  No chest pain, orthopnea, edema.  GASTROINTESTINAL: No nausea, vomiting, diarrhea or abdominal pain.  GENITOURINARY: No dysuria, hematuria.  ENDOCRINE: No polyuria, nocturia,  HEMATOLOGY: No anemia, easy bruising or bleeding SKIN: No rash  or lesion. MUSCULOSKELETAL: No joint pain or arthritis.   NEUROLOGIC: No tingling, numbness, weakness.  PSYCHIATRY: No anxiety or depression.   MEDICATIONS AT HOME:  Prior to Admission medications   Medication Sig Start Date End Date Taking? Authorizing Provider  glipiZIDE (GLUCOTROL XL) 5 MG 24 hr tablet Take 5 mg by mouth at bedtime.    Yes [provider]  lisinopril (PRINIVIL,ZESTRIL) 40 MG tablet Take 40 mg by mouth at bedtime.   Yes [provider]  Omega-3 Fatty Acids (FISH OIL PO) Take 2 capsules by mouth at bedtime.    Yes [provider]  rivaroxaban (XARELTO) 20 MG TABS tablet Take 20 mg by mouth at bedtime.   Yes [provider]  metoprolol tartrate (LOPRESSOR) 25 MG tablet Take 1 tablet (25 mg total) by mouth 2 (two) times daily. 11/28/18 01/27/19  Hinda Kehr, MD      PHYSICAL EXAMINATION:   VITAL SIGNS: Blood pressure (!) 138/97, pulse 65, temperature 98.1 F (36.7 C), temperature source Oral, resp. rate 18, height 5\' 11"  (1.803 m), weight 117.9 kg, SpO2 98 %.  GENERAL:  67 y.o.-year-old patient lying in the bed with no acute distress.  EYES: Pupils equal, round, reactive to light and accommodation. No scleral icterus. Extraocular muscles intact.  HEENT: Head atraumatic, normocephalic. Oropharynx and nasopharynx clear.  NECK:  Supple, no jugular venous distention. No thyroid enlargement, no tenderness.  LUNGS: Normal breath sounds bilaterally, no wheezing, rales,rhonchi or crepitation. No use of accessory muscles of respiration.  CARDIOVASCULAR: S1, S2 normal. No murmurs, rubs, or gallops.  ABDOMEN: Soft, nontender, nondistended. Bowel sounds present. No organomegaly or mass.  EXTREMITIES: No pedal edema, cyanosis, or clubbing.  NEUROLOGIC: Cranial nerves II through XII are intact. Muscle strength 5/5 in all extremities. Sensation intact. Gait not checked.  PSYCHIATRIC: The patient is alert and oriented x 3.  SKIN: No obvious rash,  lesion, or ulcer.   LABORATORY PANEL:   CBC Recent Labs  Lab 11/27/18 2058  WBC 9.9  HGB 15.7  HCT 44.8  PLT 344  MCV 91.4  MCH 32.0  MCHC 35.0  RDW 12.6  LYMPHSABS 3.2  MONOABS 0.9  EOSABS 0.3  BASOSABS 0.1   ------------------------------------------------------------------------------------------------------------------  Chemistries  Recent Labs  Lab 11/27/18 2058  NA 134*  K 4.5  CL 97*  CO2 27  GLUCOSE 208*  BUN 19  CREATININE 1.15  CALCIUM 9.4  AST 24  ALT 23  ALKPHOS 73  BILITOT 0.6   ------------------------------------------------------------------------------------------------------------------ estimated creatinine clearance is 80.3 mL/min (by C-G formula based on SCr of 1.15 mg/dL). ------------------------------------------------------------------------------------------------------------------ No results for input(s): TSH, T4TOTAL, T3FREE, THYROIDAB in the last 72 hours.  Invalid input(s): FREET3   Coagulation profile No results for input(s): INR, PROTIME in the last 168 hours. ------------------------------------------------------------------------------------------------------------------- No results for input(s): DDIMER in the last 72 hours. -------------------------------------------------------------------------------------------------------------------  Cardiac Enzymes Recent Labs  Lab 11/27/18 2058 11/28/18 0253  TROPONINI <0.03 <0.03   ------------------------------------------------------------------------------------------------------------------ Invalid input(s): POCBNP  ---------------------------------------------------------------------------------------------------------------  Urinalysis No results found for: COLORURINE, APPEARANCEUR, [place], [place],  GLUCOSEU, HGBUR, BILIRUBINUR, KETONESUR, PROTEINUR, UROBILINOGEN, NITRITE, LEUKOCYTESUR   RADIOLOGY: Dg Chest 2 View  Result Date: 11/27/2018 CLINICAL DATA:  Rib cage  pain and upper back pain starting several days ago. Dizziness and diaphoresis. EXAM: CHEST - 2 VIEW COMPARISON:  05/18/2018 FINDINGS: Normal heart size mediastinal contours. Median sternotomy sutures are again noted without significant change. No alveolar consolidation, edema, effusion or pneumothorax. Minimal left base scarring and/or atelectasis. No acute osseous abnormality. IMPRESSION: No active cardiopulmonary disease. Electronically Signed   By: Ashley Royalty M.D.   On: 11/27/2018 21:14    EKG: Orders placed or performed during the hospital encounter of 11/27/18  . ED EKG  . ED EKG    IMPRESSION AND PLAN:  1.  Dyspnea with exertion.  This could be related to unstable angina or related to side effects from some medications, like sotalol.  At this time, patient is stable, asymptomatic.  His EKG is negative for any acute changes and first 2 troponin levels are negative. Patient would like to change back to metoprolol and Xarelto for his atrial fibrillation and see if this would help with his symptoms.  He would follow-up with cardiology in the office, in the next 2 weeks. 2.  Paroxysmal atrial fibrillation, currently in sinus rhythm, rate controlled.  Continue metoprolol and Xarelto. 3.  Coronary artery disease, status post CABG in 2004 and recent stent placement, 4 months ago.  Continue medical treatment.  Continue to follow-up with cardiology as outpatient. 4.  Hyperlipidemia.  Patient mentions that he has a lot of muscle aches and on 80 mg Lipitor.  He would like to discuss with cardiology to possibly reduce this dose to a level where he can tolerated.  I also advised him to take co-Q10, went on statin.  Low-cholesterol diet and exercise was discussed with patient in detail. 5.  Diabetes type 2, well controlled.  Continue low-carb diet and glipizide.  Low-carb diet and exercise regimen were discussed with patient in detail.   All the records are reviewed and case discussed with ED  provider. Management plans discussed with the patient, who is in agreement.  CODE STATUS: Code Status History    Date Active Date Inactive Code Status Order ID Comments User Context   08/08/2018 1516 08/09/2018 1648 Full Code [id number]  Saundra Shelling, MD Inpatient   08/08/2018 1420 08/08/2018 1516 Full Code [id number]  Isaias Cowman, MD Inpatient   11/16/2017 1346 11/17/2017 1722 Full Code [id number]  Max Sane, MD Inpatient       TOTAL TIME TAKING CARE OF THIS PATIENT: 55 minutes.    [name] Jo M.D on 11/28/2018 at 3:29 AM  Between 7am to 6pm - Pager - [phone]  After 6pm go to [email or web address] - Proofreader  [employer] Office  [phone]  CC: Primary care physician; [facility] [place]

## 2018-12-16 ENCOUNTER — Encounter: Payer: Self-pay | Admitting: Physician Assistant

## 2018-12-16 NOTE — Progress Notes (Signed)
Cardiology Office Note Date:  12/23/2018  Patient ID:  Derrick Gallegos, Derrick Gallegos [birth year], MRN [id number] PCP:  [facility] [place]  Cardiologist:  Dr. Saunders Revel, MD    Chief Complaint: ED follow up  History of Present Illness: Derrick Gallegos is a 67 y.o. male with history of CAD s/p reported 3-vessel CABG in 2004 at [facility], atrial flutter diagnosed in 04/2018 s/p TEE/DCCV by outside cardiologist, DM2, HTN, HLD with statin intolerance, prior tobacco abuse, family history of premature CAD, morbid obesity, anxiety, and depression who presents for ED follow up of chest pain.   Patient was previously followed by [facility] years prior. He underwent 3-vessel CABG at [facility] in 2004 (details unclear). He was lost to follow up for ~ 1 year and established with Dr. Nehemiah Massed in ~2005, though never followed up on recommended testing. He has known family history of premature CAD with his father passing away from an MI at age 59. His mother and brother also have known CAD. He was seen by [facility] during an admission in 10/2017 for chest pain with cardiac enzymes being negative. EKG showed no significant abnormalities. Myoview showed a defect in the inferior/inferoseptal base consistent with scar and/or possible soft tissue attenuation without definite ischemia. Probable low risk study. He was seen in the ED in 04/2018 with chest pain, SOB, palpitations. He was found to be in new onset atrial flutter with RVR. He was rate controlled and referred to Dr. Humphrey Rolls at his request. He was not started on [facility] at that time. He apparently followed up with Dr. Humphrey Rolls and underwent TEE/DCCV on 06/20/2018. TEE showed no evidence of thrombus with mild MR/TR. No other details are noted on this study. Notes indicate he underwent successful DCCV. He underwent scheduled outpatient diagnostic LHC on 08/08/2018 with Dr. Humphrey Rolls that showed severe multivessel CAD with a patent LIMA to LAD. The SVG to PDA was occluded. There was a  high-grade lesion in the proximal and distal LCx. The patient underwent successful PCI/DES x2 to the mid and distal LCx.   He was most recently seen in the ED on 11/27/2018 with chest pain. He ruled out. EKG showed NSR, inferior Q waves, TWI leads aVL and V2. Patient preferred to change cardiologist and was referred to [facility]. He presents today for follow up.   Patient comes in doing well today.  He reports since self discontinuing sotalol approximately 3 weeks prior, his symptoms of chest pain, back pain, shortness of breath, and fatigue have completely resolved.  In its place, he has initiated metoprolol which apparently was under the advice of the ER.  He indicates a prior intolerance to amiodarone secondary to fatigue and nausea.  He reports he has been maintained on Xarelto as monotherapy following 30 days of aspirin and Plavix following his PCI/DES x2 in 07/2018.  He reports severe myalgias with simvastatin, atorvastatin, and rosuvastatin.  Prior cardiologist recommended a PCSK9 inhibitor, however the patient was unable to afford the $140 per month co-pay.  He has been maintained on fish oil.  He reports no exertional chest pain or shortness of breath.  No lower extremity swelling, orthopnea, PND, or early satiety.  No recent falls.  No BRBPR or melena.  He does bruise easily.  His blood pressure at home typically runs in the 409W to 119J systolic.  He has not smoked since his bypass surgery however his wife continues to smoke around him.  He would like to establish care with Dr.  End.   Past Medical History:  Diagnosis Date  . Anxiety   . Arthritis   . Atrial flutter ([place])    a. diagnosed 5/19; b. s/p TEE/DCCV 05/2018 by Dr. Humphrey Rolls; c. CHADS2VASc => 4 (HTN, age x1, DM, vascular disease)  . Coronary artery disease    a. s/p 3-v cabg in 2004 at [place] (LIMA and radial arery used - no further details in Care Everywhere); b. LHC 8/19 underlying multi-V dz, patent LIMA-LAD, occluded VG-PDA, 90-95%  stenosis of LCx s/p PCI/DES x 2  . Depression   . Diabetes mellitus with complication ([place])   . Essential hypertension   . Family history of premature CAD    a. father passed away from an MI at 93  . HLD (hyperlipidemia)    a. statin intolerant   . MI (myocardial infarction) ([place])   . Obesity     Past Surgical History:  Procedure Laterality Date  . CARDIAC CATHETERIZATION  2004  . CARDIAC CATHETERIZATION  2019   x2 stents [facility]  . CARDIAC SURGERY    . CARDIOVERSION N/A 06/20/2018   Procedure: CARDIOVERSION;  Surgeon: Dionisio David, MD;  Location: [facility] ORS;  Service: Cardiovascular;  Laterality: N/A;  . CORONARY ARTERY BYPASS GRAFT    . CORONARY STENT INTERVENTION N/A 08/08/2018   Procedure: CORONARY STENT INTERVENTION;  Surgeon: Isaias Cowman, MD;  Location: [place] [facility];  Service: Cardiovascular;  Laterality: N/A;  . LEFT HEART CATH AND CORONARY ANGIOGRAPHY Left 08/08/2018   Procedure: LEFT HEART CATH AND CORONARY ANGIOGRAPHY;  Surgeon: Dionisio David, MD;  Location: [place] [facility];  Service: Cardiovascular;  Laterality: Left;  . TEE WITHOUT CARDIOVERSION N/A 06/20/2018   Procedure: TRANSESOPHAGEAL ECHOCARDIOGRAM (TEE);  Surgeon: Dionisio David, MD;  Location: [facility] ORS;  Service: Cardiovascular;  Laterality: N/A;    Current Meds  Medication Sig  . acetaminophen (TYLENOL) 500 MG tablet Take 500 mg by mouth every 6 (six) hours as needed.  Marland Kitchen glipiZIDE (GLUCOTROL XL) 5 MG 24 hr tablet Take 5 mg by mouth at bedtime.   Marland Kitchen lisinopril (PRINIVIL,ZESTRIL) 40 MG tablet Take 40 mg by mouth at bedtime.  . metoprolol tartrate (LOPRESSOR) 25 MG tablet Take 1 tablet (25 mg total) by mouth 2 (two) times daily.  . Omega-3 Fatty Acids (FISH OIL PO) Take 2 capsules by mouth at bedtime.   . rivaroxaban (XARELTO) 20 MG TABS tablet Take 20 mg by mouth at bedtime.    Allergies:   Patient has no known allergies.   Social History:  The patient  reports that he has quit smoking. His  smoking use included cigarettes. He has never used smokeless tobacco. He reports that he does not drink alcohol or use drugs.   Family History:  The patient's family history includes CAD in his brother, father, and mother.  ROS:   Review of Systems  Constitutional: Negative for chills, diaphoresis, fever, malaise/fatigue and weight loss.  HENT: Negative for congestion.   Eyes: Negative for discharge and redness.  Respiratory: Negative for cough, hemoptysis, sputum production, shortness of breath and wheezing.   Cardiovascular: Negative for chest pain, palpitations, orthopnea, claudication, leg swelling and PND.  Gastrointestinal: Negative for abdominal pain, blood in stool, heartburn, melena, nausea and vomiting.  Genitourinary: Negative for hematuria.  Musculoskeletal: Negative for falls and myalgias.  Skin: Negative for rash.  Neurological: Negative for dizziness, tingling, tremors, sensory change, speech change, focal weakness, loss of consciousness and weakness.  Endo/Heme/Allergies: Bruises/bleeds easily.  Psychiatric/Behavioral: Negative  for substance abuse. The patient is not nervous/anxious.   All other systems reviewed and are negative.    PHYSICAL EXAM:  VS:  BP 128/80 (BP Location: Right Arm, Patient Position: Sitting, Cuff Size: Normal)   Pulse 71   Ht 5\' 11"  (1.803 m)   Wt 266 lb (120.7 kg)   BMI 37.10 kg/m  BMI: Body mass index is 37.1 kg/m.  Physical Exam  Constitutional: He is oriented to person, place, and time. He appears well-developed and well-nourished.  HENT:  Head: Normocephalic and atraumatic.  Eyes: Right eye exhibits no discharge. Left eye exhibits no discharge.  Neck: Normal range of motion. No JVD present.  Cardiovascular: Normal rate, regular rhythm, S1 normal, S2 normal and normal heart sounds. Exam reveals no distant heart sounds, no friction rub, no midsystolic click and no opening snap.  No murmur heard. Pulses:      Posterior tibial pulses are  2+ on the right side and 2+ on the left side.  Pulmonary/Chest: Effort normal and breath sounds normal. No respiratory distress. He has no decreased breath sounds. He has no wheezes. He has no rales. He exhibits no tenderness.  Abdominal: Soft. He exhibits no distension. There is no abdominal tenderness.  Musculoskeletal:        General: No edema.  Neurological: He is alert and oriented to person, place, and time.  Skin: Skin is warm and dry. No cyanosis. Nails show no clubbing.  Psychiatric: He has a normal mood and affect. His speech is normal and behavior is normal. Judgment and thought content normal.     EKG:  Was ordered and interpreted by me today. Shows NSR, 71 bpm inferior Q waves, no acute st/t changes   Recent Labs: 08/09/2018: Magnesium 2.0 11/27/2018: ALT 23; BUN 19; Creatinine, Ser 1.15; Hemoglobin 15.7; Platelets 344; Potassium 4.5; Sodium 134  08/09/2018: Cholesterol 203; HDL 35; LDL Cholesterol UNABLE TO CALCULATE IF TRIGLYCERIDE OVER 400 mg/dL; Total CHOL/HDL Ratio 5.8; Triglycerides 579; VLDL UNABLE TO CALCULATE IF TRIGLYCERIDE OVER 400 mg/dL   CrCl cannot be calculated (Patient's most recent lab result is older than the maximum 21 days allowed.).   Wt Readings from Last 3 Encounters:  12/23/18 266 lb (120.7 kg)  11/27/18 260 lb (117.9 kg)  08/08/18 270 lb 11.6 oz (122.8 kg)     Other studies reviewed: Additional studies/records reviewed today include: summarized above  ASSESSMENT AND PLAN:  1. CAD status post CABG status post recent PCI without angina: He is doing well without any symptoms concerning for recurrent angina.  I did discuss his case with Drs. Gollan and Fletcher Anon given that he recently underwent PCI/DES to the left circumflex in 07/2018 in the setting of unstable angina and is not currently on antiplatelet therapy.  Dr. Fletcher Anon has recommended we place the patient on Plavix 75 mg daily, without loading dose, and maintain him on this and Xarelto for the next 2  months.  Following that, he will be placed on Xarelto and aspirin 81 mg daily for the next 6 months.  At that time, he will be 12 months out from his PCI and if he is doing well we could consider maintaining him on Xarelto monotherapy.  This was discussed with the patient in detail today.  Not currently on a statin as outlined below.  Remains on Lopressor.  Aggressive risk factor modification and secondary prevention.  2. Atrial flutter: Status post successful TEE/DCCV in 05/2018.  Maintaining sinus rhythm.  Continue Lopressor for rate control.  Given his CHADS2VASC of at least 4 (HTN, age x 1, DM, vascular disease) he will continue Xarelto 20 mg daily.  Should he have recurrence of atrial flutter would recommend referral to EP for consideration of atrial flutter ablation.  3. Hypertension: Blood pressure is reasonably controlled on recheck at 128/80.  Continue lisinopril and metoprolol.  4. Hyperlipidemia/hypertriglyceridemia: Most recent lipid panel from 07/2018 demonstrated a triglyceride level of 579.  Unable to calculate LDL at that time.  Check lipid panel and direct LDL today.  Patient is currently fasting he reports.  Patient is intolerant to statin therapy, reports myalgias with simvastatin, atorvastatin, and rosuvastatin.  He has indicated he was unable to afford the $140 co-pay with a PCSK9 inhibitor.  Following updated labs checked today, we will revisit potential PCSK9 inhibitor and see if he is able to afford this.  I have also recommended that we should consider Vascepa given his elevated triglycerides.  Goal LDL less than 70.  Most recent liver function testing normal from 11/2018.  Disposition: F/u with Dr. Saunders Revel (patient request) in 2 months.   Current medicines are reviewed at length with the patient today.  The patient did not have any concerns regarding medicines.  Signed, Christell Faith, PA-C 12/23/2018 9:22 AM     [facility] [address] [place],  [place] [id number] [phone]

## 2018-12-23 ENCOUNTER — Encounter: Payer: Self-pay | Admitting: Physician Assistant

## 2018-12-23 ENCOUNTER — Ambulatory Visit (INDEPENDENT_AMBULATORY_CARE_PROVIDER_SITE_OTHER): Payer: Medicare Other | Admitting: Physician Assistant

## 2018-12-23 VITALS — BP 128/80 | HR 71 | Ht 71.0 in | Wt 266.0 lb

## 2018-12-23 DIAGNOSIS — I200 Unstable angina: Secondary | ICD-10-CM | POA: Diagnosis not present

## 2018-12-23 DIAGNOSIS — I4892 Unspecified atrial flutter: Secondary | ICD-10-CM | POA: Diagnosis not present

## 2018-12-23 DIAGNOSIS — I10 Essential (primary) hypertension: Secondary | ICD-10-CM

## 2018-12-23 DIAGNOSIS — I2510 Atherosclerotic heart disease of native coronary artery without angina pectoris: Secondary | ICD-10-CM | POA: Diagnosis not present

## 2018-12-23 DIAGNOSIS — E785 Hyperlipidemia, unspecified: Secondary | ICD-10-CM | POA: Diagnosis not present

## 2018-12-23 DIAGNOSIS — E781 Pure hyperglyceridemia: Secondary | ICD-10-CM

## 2018-12-23 DIAGNOSIS — Z789 Other specified health status: Secondary | ICD-10-CM

## 2018-12-23 MED ORDER — CLOPIDOGREL BISULFATE 75 MG PO TABS: 75.0000 mg | ORAL_TABLET | Freq: Every day | ORAL | 0 refills | Status: DC

## 2018-12-23 NOTE — Patient Instructions (Signed)
Medication Instructions:  Continue the Plavix 75 mg daily for 2 months  If you need a refill on your cardiac medications before your next appointment, please call your pharmacy.   Lab work: Your provider would like for you to have the following labs today: Lipid, liver and direct LDL  If you have labs (blood work) drawn today and your tests are completely normal, you will receive your results only by: Marland Kitchen MyChart Message (if you have MyChart) OR . A paper copy in the mail If you have any lab test that is abnormal or we need to change your treatment, we will call you to review the results.  Testing/Procedures: None ordered  Follow-Up: At [facility], you and your health needs are our priority.  As part of our continuing mission to provide you with exceptional heart care, we have created designated Provider Care Teams.  These Care Teams include your primary Cardiologist (physician) and Advanced Practice Providers (APPs -  Physician Assistants and Nurse Practitioners) who all work together to provide you with the care you need, when you need it. You will need a follow up appointment in 2 months.  You may see Dr. Saunders Revel or one of the following Advanced Practice Providers on your designated Care Team:   Murray Hodgkins, NP Christell Faith, PA-C . Marrianne Mood, PA-C

## 2018-12-24 LAB — LIPID PANEL
Chol/HDL Ratio: 7 ratio — ABNORMAL HIGH (ref 0.0–5.0)
Cholesterol, Total: 287 mg/dL — ABNORMAL HIGH (ref 100–199)
HDL: 41 mg/dL (ref >39)
Triglycerides: 448 mg/dL — ABNORMAL HIGH (ref 0–149)

## 2018-12-24 LAB — HEPATIC FUNCTION PANEL
ALT: 25 IU/L (ref 0–44)
AST: 21 IU/L (ref 0–40)
Albumin: 4.8 g/dL (ref 3.6–4.8)
Alkaline Phosphatase: 73 IU/L (ref 39–117)
Bilirubin Total: 0.4 mg/dL (ref 0.0–1.2)
Bilirubin, Direct: 0.12 mg/dL (ref 0.00–0.40)
Total Protein: 7.3 g/dL (ref 6.0–8.5)

## 2018-12-24 LAB — LDL CHOLESTEROL, DIRECT: LDL Direct: 163 mg/dL — ABNORMAL HIGH (ref 0–99)

## 2019-01-01 ENCOUNTER — Telehealth: Payer: Self-pay

## 2019-01-01 NOTE — Telephone Encounter (Signed)
-----   Message from Rise Mu, PA-C sent at 12/24/2018  7:15 AM EST ----- Normal liver function.  Fasting triglycerides remain elevated.  Direct LDL of 163.  Patient is intolerant to statins.  At the patient's prior cardiologist, they attempted to start him on a PCSK9 inhibitor however he was unable to afford the $150 per month co-pay.  Please see if the patient's insurance will cover [place] or Praluent and if he will be able to afford the co-pay.  If so please let me know and I will place the order.  If he is not able to afford either of these medications we will need to check in to the affordability of Vascepa.

## 2019-01-01 NOTE — Telephone Encounter (Signed)
Call to patient to discuss lab results. I gave him the name of possible medications that would help lower his cholesterol and encouraged him to call his insurance company to ask which would be most affordable for him. He will call us back when he has that information.  Advised pt to call for any further questions or concerns

## 2019-01-02 ENCOUNTER — Telehealth: Payer: Self-pay

## 2019-01-02 NOTE — Telephone Encounter (Signed)
Medications and doses for patient to check with his insurance company regarding cost include: Praluent 75 mg subcutaneous every 2 week or 300 mg subcutaneous every 4 week Repatha 140 mg subcutaneous every 2 week or 420 mg subcutaneous monthly Vescepa 2 g p.o. twice daily

## 2019-01-02 NOTE — Telephone Encounter (Signed)
Spoke to daughter, who is handling call to insurance provider. I gave all possible prescriptions that Christell Faith, PA offered.  She reported that she would call insurance and would call us back when they figured out which would be best. Advised pt to call for any further questions or concerns

## 2019-01-02 NOTE — Telephone Encounter (Signed)
Call returned from daughter, she reported that she called insurance company and they said they would need exact dosages the pt would be getting of all 3 medications before they could give her an estimate.  Forwarded to provider for more details.

## 2019-01-03 NOTE — Telephone Encounter (Signed)
Patient wife calling back to let us know atorvastatin was suggested by insurance for affordability reasons and patient has had issues with other statin drugs in the past   Wife would like to discuss

## 2019-01-07 NOTE — Telephone Encounter (Signed)
Patient has documented intolerance to Crestor, Lipitor, and simvastatin.  If he would like we could see if he would tolerate Zetia 10 mg daily.  Could also consider Livalo 2 mg daily.

## 2019-01-07 NOTE — Telephone Encounter (Signed)
Says they called his insurance and they would cover the medications it was just going to be more than what he could afford.  With statin drugs, patient has had muscle pain in hip and legs. He's tried Zocor and Crestor in the past.  States he will try a statin drug again as it has been 15 years and he is willing to pursue the patient assistance process for Praluant or Repatha as well. Advised I will route to [facility] for further advice.

## 2019-01-09 MED ORDER — EZETIMIBE 10 MG PO TABS: 10.0000 mg | ORAL_TABLET | Freq: Every day | ORAL | 3 refills | Status: DC

## 2019-01-09 NOTE — Telephone Encounter (Signed)
Noted  

## 2019-01-09 NOTE — Telephone Encounter (Signed)
I spoke with the patient. He is agreeable with starting zetia 10 mg once daily. I have advised him that we will plan to recheck his lipid profile in about 8 weeks- or just prior to seeing Dr. Saunders Revel back. I will be in touch with him closer to his follow up to schedule him for a repeat lipid panel. The patient voices understanding and is agreeable.  To Thurmond Butts as an FYI- ok with a repeat lipid panel prior to his follow up with Dr. Saunders Revel?

## 2019-01-09 NOTE — Telephone Encounter (Signed)
Yes, repeat lipid and liver function prior to his visit with Dr. Saunders Revel will be good and allow for Dr. Saunders Revel to potentially make any adjustments needed.

## 2019-01-27 ENCOUNTER — Telehealth: Payer: Self-pay | Admitting: Internal Medicine

## 2019-01-27 NOTE — Telephone Encounter (Signed)
Pt c/o medication issue:  1. Name of Medication: xarelto   2. How are you currently taking this medication (dosage and times per day)? 20 mg po q d   3. Are you having a reaction (difficulty breathing--STAT)? Muscle / joint soreness   4. What is your medication issue? Patient states since starting he has been sore patient asked if he meant zetia but he says no he thinks this is from the 2nd blood thinner

## 2019-01-28 NOTE — Telephone Encounter (Signed)
Called patient. Complaint of muscle soreness and cramping after walking about 1/2 block.  Describes them as "spasms."  Located in his back, buttocks, legs and hips. He also has constant soreness in his forearm and elbow area.  He started Xarelto in June 2019 and these symptoms started about 1 month after and have progressively gotten worse.  He thinks its from the [place].  He started Zetia 01/09/19 but says he had these symptoms prior to starting.  He would like to try not taking the Xarelto to see if that helps. Advised to continue current medications at this time and will route to Dr End for further advice.

## 2019-01-28 NOTE — Telephone Encounter (Signed)
I have never met Mr. Bellucci.  I will defer his question to whichever provider began rivaroxaban.  He should not stop the medication without clearance by a cardiology provider.  [name], MD [facility] Pager: [phone]

## 2019-01-28 NOTE — Telephone Encounter (Signed)
Thurmond Butts saw patient 12/23/18. Routing to him for advice.

## 2019-01-29 MED ORDER — APIXABAN 5 MG PO TABS: 5.0000 mg | ORAL_TABLET | Freq: Two times a day (BID) | ORAL | 3 refills | Status: DC

## 2019-01-29 NOTE — Telephone Encounter (Signed)
Xarelto can cause some myalgias and spasms. If he would like, we can transition him to Eliquis 5 mg bid. He would need to discontinue Xarelto and take his first dose of Eliquis 5 mg bid at his next scheduled dose of Xarelto.

## 2019-01-29 NOTE — Telephone Encounter (Signed)
Called patient. He is agreeable to make the switch. He verbalized understanding to start the Eliquis in the evening when his next dose of Xarelto would be. He will let us know if any new questions or concerns arise. Rx sent to pharmacy and Xarelto discontinued.

## 2019-01-30 ENCOUNTER — Telehealth: Payer: Self-pay

## 2019-01-30 NOTE — Telephone Encounter (Signed)
Please see my note from 12/23/2018. After discussing the case with interventional cardiology, the patient will remain on DOAC and Plavix through 01/2019, then transition to [place] and ASA x 6 months (without Plavix), then he can come off ASA and remain on DOAC only. This is to treat his recent PCI and atrial flutter. If patient cannot afford Eliquis, please provide him with patient assistance paperwork. If he still cannot afford Eliquis and does not tolerate Xarelto, we will have to use Coumadin.

## 2019-01-30 NOTE — Telephone Encounter (Signed)
Fax received from [facility].  Refill for Plavix 75MG .   Note on paper stated: "We received RX for Eliquis and it will be $300 with insurance. Should the patient be on Plavix and Eliquis."  Please advise

## 2019-01-30 NOTE — Telephone Encounter (Signed)
Routing to [place] for review.

## 2019-02-03 MED ORDER — CLOPIDOGREL BISULFATE 75 MG PO TABS: 75.0000 mg | ORAL_TABLET | Freq: Every day | ORAL | 1 refills | Status: DC

## 2019-02-03 MED ORDER — RIVAROXABAN 20 MG PO TABS: 20.0000 mg | ORAL_TABLET | Freq: Every day | ORAL | 2 refills | Status: DC

## 2019-02-03 NOTE — Addendum Note (Signed)
Addended by: Vanessa Ralphs on: 02/03/2019 12:22 PM   Modules accepted: Orders

## 2019-02-03 NOTE — Telephone Encounter (Signed)
Ok to go back on Xarelto 20 mg q dinner.

## 2019-02-03 NOTE — Telephone Encounter (Signed)
Pt is calling back regarding his Plavix. Please call and advise.

## 2019-02-03 NOTE — Addendum Note (Signed)
Addended by: Vanessa Ralphs on: 02/03/2019 12:28 PM   Modules accepted: Orders

## 2019-02-03 NOTE — Telephone Encounter (Signed)
Called patient. States he would rather just go back on the Xarelto.  Offered him the patient assistance option and the coumadin option.  States he would rather do the Xarelto. Advised I will let provider know and make sure the switch back is ok. He also needed refill of Plavix to get him through this month.

## 2019-02-03 NOTE — Telephone Encounter (Signed)
Med list updated

## 2019-02-24 ENCOUNTER — Other Ambulatory Visit: Payer: Self-pay

## 2019-02-24 ENCOUNTER — Telehealth: Payer: Self-pay | Admitting: Internal Medicine

## 2019-02-24 MED ORDER — METOPROLOL TARTRATE 25 MG PO TABS: 25.0000 mg | ORAL_TABLET | Freq: Two times a day (BID) | ORAL | 1 refills | Status: DC

## 2019-02-24 NOTE — Telephone Encounter (Signed)
metoprolol tartrate (LOPRESSOR) 25 MG tablet 60 tablet 1 02/24/2019 04/25/2019   Sig - Route: Take 1 tablet (25 mg total) by mouth 2 (two) times daily. - Oral   Sent to pharmacy as: metoprolol tartrate (LOPRESSOR) 25 MG tablet   E-Prescribing Status: Sent to pharmacy (02/24/2019 11:05 AM EST)   [place], [place]

## 2019-02-24 NOTE — Telephone Encounter (Signed)
°*  STAT* If patient is at the pharmacy, call can be transferred to refill team.   1. Which medications need to be refilled? (please list name of each medication and dose if known)  metoprolol 25 MG 1 tablet 2 times daily   2. Which pharmacy/location (including street and city if local pharmacy) is medication to be sent to? [facility]  3. Do they need a 30 day or 90 day supply? 90 day

## 2019-03-05 ENCOUNTER — Other Ambulatory Visit: Payer: Self-pay | Admitting: *Deleted

## 2019-03-05 DIAGNOSIS — E785 Hyperlipidemia, unspecified: Secondary | ICD-10-CM

## 2019-03-05 DIAGNOSIS — Z79899 Other long term (current) drug therapy: Secondary | ICD-10-CM

## 2019-03-11 ENCOUNTER — Other Ambulatory Visit: Payer: Medicare Other

## 2019-03-14 ENCOUNTER — Telehealth: Payer: Self-pay | Admitting: Internal Medicine

## 2019-03-14 NOTE — Telephone Encounter (Signed)
Patient calling  Patient has already missed his lab appointment would like to know if he should keep his 3/25 appointment due to COVID-19 Please advise

## 2019-03-17 ENCOUNTER — Telehealth: Payer: Self-pay

## 2019-03-17 MED ORDER — CLOPIDOGREL BISULFATE 75 MG PO TABS: 75.0000 mg | ORAL_TABLET | Freq: Every day | ORAL | 3 refills | Status: DC

## 2019-03-17 MED ORDER — METOPROLOL TARTRATE 25 MG PO TABS: 25.0000 mg | ORAL_TABLET | Freq: Two times a day (BID) | ORAL | 0 refills | Status: DC

## 2019-03-17 NOTE — Telephone Encounter (Signed)
Iva, RN spoke with the patient for a screening call today.  Appt will be r/s- she sent a message to the COVID-19 scheduling pool.

## 2019-03-17 NOTE — Telephone Encounter (Signed)

## 2019-03-19 ENCOUNTER — Ambulatory Visit: Payer: Medicare Other | Admitting: Internal Medicine

## 2019-03-24 NOTE — Telephone Encounter (Signed)
Will try to schedule possible evisit  Patient had labs - please advise if orders can be changed for [facility] and needed before evisit or if should wait

## 2019-03-24 NOTE — Telephone Encounter (Signed)
Repeal labs prior to visit would be helpful, but given ongoing COVID-19 precautions, I do not believe that the risks of having him come to the hospital outweigh the benefits.  As long as he is not having issues with ezetimibe that was added by Christell Faith, PA, at his last visit, I would favor postponing labs and f/u visit for 3 months.  If he has questions or concerns, we can proceed with a virtual visit without labs.  [name], MD [facility] Pager: [phone]

## 2019-03-24 NOTE — Telephone Encounter (Signed)
Will route to Dr.End to advise if ordered lab work is needed prior to possible e-visit.

## 2019-03-26 NOTE — Telephone Encounter (Signed)
Called the pt to give Dr.End's response below. lmom pt is to call back if any questions or concerns.

## 2019-03-27 NOTE — Telephone Encounter (Signed)
Spoke with patient and he verbalized understanding of Dr Darnelle Bos Recommendations. He would be glad to have a Telehealth Webex visit with Dr End. Consent and instructions sent to patient. He verbalized understanding of the following that we went over on the phone.     Virtual Visit Pre-Appointment Phone Call  Steps For Call:  1. Confirm consent - "In the setting of the current Covid19 crisis, you are scheduled for a (phone or video) visit with your provider on (date) at (time).  Just as we do with many in-office visits, in order for you to participate in this visit, we must obtain consent.  If you'd like, I can send this to your mychart (if signed up) or email for you to review.  Otherwise, I can obtain your verbal consent now.  All virtual visits are billed to your insurance company just like a normal visit would be.  By agreeing to a virtual visit, we'd like you to understand that the technology does not allow for your provider to perform an examination, and thus may limit your provider's ability to fully assess your condition.  Finally, though the technology is pretty good, we cannot assure that it will always work on either your or our end, and in the setting of a video visit, we may have to convert it to a phone-only visit.  In either situation, we cannot ensure that we have a secure connection.  Are you willing to proceed?"  2. Give patient instructions for WebEx download to smartphone as below if video visit  3. Advise patient to be prepared with any vital sign or heart rhythm information, their current medicines, and a piece of paper and pen handy for any instructions they may receive the day of their visit  4. Inform patient they will receive a phone call 15 minutes prior to their appointment time (may be from unknown caller ID) so they should be prepared to answer  5. Confirm that appointment type is correct in Epic appointment notes (video vs telephone)    TELEPHONE CALL NOTE  Derrick Gallegos has been deemed a candidate for a follow-up tele-health visit to limit community exposure during the Covid-19 pandemic. I spoke with the patient via phone to ensure availability of phone/video source, confirm preferred email & phone number, and discuss instructions and expectations.  I reminded Derrick Gallegos to be prepared with any vital sign and/or heart rhythm information that could potentially be obtained via home monitoring, at the time of his visit. I reminded Derrick Gallegos to expect a phone call at the time of his visit if his visit.  Did the patient verbally acknowledge consent to treatment? yes  Roney Jaffe, RN 03/27/2019 5:05 PM   DOWNLOADING THE [place], go to [employer] and type in WebEx in the search bar. [place] [employer], the blue/green circle. The app is free but as with any other app downloads, their phone may require them to verify saved payment information or Apple password. The patient does NOT have to create an account.  - If Android, ask patient to go to [employer] and type in WebEx in the search bar. [place] [employer], the blue/green circle. The app is free but as with any other app downloads, their phone may require them to verify saved payment information or Android password. The patient does NOT have to create an account.   CONSENT FOR TELE-HEALTH VISIT - PLEASE REVIEW  I hereby voluntarily request, consent and authorize [facility] and its employed or contracted physicians, physician assistants, nurse practitioners or other licensed health care professionals (the Practitioner), to provide me with telemedicine health care services (the "Services") as deemed necessary by the treating Practitioner. I acknowledge and consent to receive the Services by the Practitioner via telemedicine. I understand that the telemedicine visit will involve communicating with the Practitioner through live  audiovisual communication technology and the disclosure of certain medical information by electronic transmission. I acknowledge that I have been given the opportunity to request an in-person assessment or other available alternative prior to the telemedicine visit and am voluntarily participating in the telemedicine visit.  I understand that I have the right to withhold or withdraw my consent to the use of telemedicine in the course of my care at any time, without affecting my right to future care or treatment, and that the Practitioner or I may terminate the telemedicine visit at any time. I understand that I have the right to inspect all information obtained and/or recorded in the course of the telemedicine visit and may receive copies of available information for a reasonable fee.  I understand that some of the potential risks of receiving the Services via telemedicine include:  Marland Kitchen Delay or interruption in medical evaluation due to technological equipment failure or disruption; . Information transmitted may not be sufficient (e.g. poor resolution of images) to allow for appropriate medical decision making by the Practitioner; and/or  . In rare instances, security protocols could fail, causing a breach of personal health information.  Furthermore, I acknowledge that it is my responsibility to provide information about my medical history, conditions and care that is complete and accurate to the best of my ability. I acknowledge that Practitioner's advice, recommendations, and/or decision may be based on factors not within their control, such as incomplete or inaccurate data provided by me or distortions of diagnostic images or specimens that may result from electronic transmissions. I understand that the practice of medicine is not an exact science and that Practitioner makes no warranties or guarantees regarding treatment outcomes. I acknowledge that I will receive a copy of this consent concurrently upon  execution via email to the email address I last provided but may also request a printed copy by calling the office of [facility].    I understand that my insurance will be billed for this visit.   I have read or had this consent read to me. . I understand the contents of this consent, which adequately explains the benefits and risks of the Services being provided via telemedicine.  . I have been provided ample opportunity to ask questions regarding this consent and the Services and have had my questions answered to my satisfaction. . I give my informed consent for the services to be provided through the use of telemedicine in my medical care  By participating in this telemedicine visit I agree to the above.

## 2019-03-28 NOTE — Progress Notes (Signed)
Virtual Visit via Video Note    Evaluation Performed:  Follow-up visit  This visit type was conducted due to national recommendations for restrictions regarding the COVID-19 Pandemic (e.g. social distancing).  This format is felt to be most appropriate for this patient at this time.  All issues noted in this document were discussed and addressed.  No physical exam was performed (except for noted visual exam findings with Video Visits).  Verbal consent was obtained from the patient.   Date:  03/31/2019   ID:  Derrick Gallegos, DOB [birth year], MRN [id number]  Patient Location:  [place] [address]  Provider location:   [place], [place]  PCP:  [facility] [place]  Cardiologist:  [name], MD Electrophysiologist:  None   Chief Complaint:  Follow-up coronary artery disease, hyperlipidemia, and atrial flutter  History of Present Illness:    Derrick Gallegos is a 68 y.o. male who presents via audio/video conferencing for a telehealth visit today.  He has a history of coronary artery disease with reported three-vessel CABG at [facility] in 2004 and subsequent PCI x2 to the LCx in 07/2018, atrial flutter status post TEE guided cardioversion in 04/2018, hypertension, hyperlipidemia with statin intolerance, type 2 diabetes mellitus, and prior tobacco use.  He has been seen by multiple cardiology providers over the years, most recently by Christell Faith, PA, in late 11/2018 after an ED visit for atypical chest pain.  At the time of his visit with Derrick Gallegos, he was doing well.  He had self discontinued sotalol about 3 weeks earlier with subsequent resolution of chest pain, back pain, dyspnea, and fatigue.  He was previously advised to consider taking a PCSK9 inhibitor due to statin intolerance but could not afford the co-pay.  Lipid panel at that visit was notable for triglycerides of 448 and LDL of 163.  He was started on ezetimibe in early January with plans to repeat FLP and  LFTs in 3 months.  Today, Derrick Gallegos reports that he has been feeling well.  He denies chest pain, shortness of breath, palpitations, lightheadedness, and edema.  He remains on rivaroxaban and clopidogrel without significant bleeding, though he is concerned about bleeding risk with multiple blood thinners.  He is tolerating ezetimibe well.  He had labs drawn through his PCP today and believes that lipids were included in this.  He reports having tried 4 different statins over the years and experienced significant myalgias with all four agents.  PCSK9 inhibitors were previously considered but copay was cost-prohibitive.  The patient does not have symptoms concerning for COVID-19 infection (fever, chills, cough, or new shortness of breath).    Prior CV studies:   The following studies were reviewed today:  LHC/PCI (08/08/2018): LMCA normal.  LAD with 60% ostial and 100% proximal stenoses.  LCx with 80% proximal and distal stenoses.  Chronic total occlusion of RCA with left to right filling of the PDA.  SVG to RCA occluded.  LIMA to LAD widely patent.  Successful PCI to the proximal and distal LCx using Resolute Onyx 3.0 x 15 mm and 2.0 x 12 mm drug-eluting stents, respectively.  Past Medical History:  Diagnosis Date   Anxiety    Arthritis    Atrial flutter ([place])    a. diagnosed 5/19; b. s/p TEE/DCCV 05/2018 by Dr. Humphrey Gallegos; c. CHADS2VASc => 4 (HTN, age x1, DM, vascular disease)   Coronary artery disease    a. s/p 3-v cabg in 2004 at [facility] (LIMA  and radial arery used - no further details in Care Everywhere); b. LHC 8/19 underlying multi-V dz, patent LIMA-LAD, occluded VG-PDA, 90-95% stenosis of LCx s/p PCI/DES x 2   Depression    Diabetes mellitus with complication (HCC)    Essential hypertension    Family history of premature CAD    a. father passed away from an MI at 54   HLD (hyperlipidemia)    a. statin intolerant    MI (myocardial infarction) ([place])    Obesity    Past Surgical  History:  Procedure Laterality Date   CARDIAC CATHETERIZATION  2004   CARDIAC CATHETERIZATION  2019   x2 stents [place] N/A 06/20/2018   Procedure: CARDIOVERSION;  Surgeon: Dionisio David, MD;  Location: [facility] ORS;  Service: Cardiovascular;  Laterality: N/A;   CORONARY ARTERY BYPASS GRAFT     CORONARY STENT INTERVENTION N/A 08/08/2018   Procedure: CORONARY STENT INTERVENTION;  Surgeon: Isaias Cowman, MD;  Location: [place] [facility];  Service: Cardiovascular;  Laterality: N/A;   LEFT HEART CATH AND CORONARY ANGIOGRAPHY Left 08/08/2018   Procedure: LEFT HEART CATH AND CORONARY ANGIOGRAPHY;  Surgeon: Dionisio David, MD;  Location: [place] CV LAB;  Service: Cardiovascular;  Laterality: Left;   TEE WITHOUT CARDIOVERSION N/A 06/20/2018   Procedure: TRANSESOPHAGEAL ECHOCARDIOGRAM (TEE);  Surgeon: Dionisio David, MD;  Location: [facility] ORS;  Service: Cardiovascular;  Laterality: N/A;     Current Meds  Medication Sig   acetaminophen (TYLENOL) 500 MG tablet Take 500 mg by mouth every 6 (six) hours as needed.   ezetimibe (ZETIA) 10 MG tablet Take 1 tablet (10 mg total) by mouth daily.   glipiZIDE (GLUCOTROL XL) 5 MG 24 hr tablet Take 5 mg by mouth at bedtime.    lisinopril (PRINIVIL,ZESTRIL) 40 MG tablet Take 40 mg by mouth at bedtime.   metoprolol tartrate (LOPRESSOR) 25 MG tablet Take 1 tablet (25 mg total) by mouth 2 (two) times daily.   Omega-3 Fatty Acids (FISH OIL PO) Take 2 capsules by mouth at bedtime.    rivaroxaban (XARELTO) 20 MG TABS tablet Take 1 tablet (20 mg total) by mouth daily with supper.   [DISCONTINUED] clopidogrel (PLAVIX) 75 MG tablet Take 1 tablet (75 mg total) by mouth daily.     Allergies:   Patient has no known allergies.   Social History   Tobacco Use   Smoking status: Former Smoker    Types: Cigarettes   Smokeless tobacco: Never Used  Substance Use Topics   Alcohol use: No   Drug use: No      Family Hx: The patient's family history includes CAD in his brother, father, and mother.  ROS:   Please see the history of present illness.   All other systems reviewed and are negative.   Labs/Other Tests and Data Reviewed:    Recent Labs: 08/09/2018: Magnesium 2.0 11/27/2018: BUN 19; Creatinine, Ser 1.15; Hemoglobin 15.7; Platelets 344; Potassium 4.5; Sodium 134 12/23/2018: ALT 25   Recent Lipid Panel Lab Results  Component Value Date/Time   CHOL 287 (H) 12/23/2018 10:12 AM   TRIG 448 (H) 12/23/2018 10:12 AM   HDL 41 12/23/2018 10:12 AM   CHOLHDL 7.0 (H) 12/23/2018 10:12 AM   CHOLHDL 5.8 08/09/2018 03:10 AM   LDLCALC Comment 12/23/2018 10:12 AM   LDLDIRECT 163 (H) 12/23/2018 10:12 AM    Wt Readings from Last 3 Encounters:  03/31/19 265 lb (120.2 kg)  12/23/18 266  lb (120.7 kg)  11/27/18 260 lb (117.9 kg)     Objective:    Vital Signs:  BP 128/78 (BP Location: Left Arm, Patient Position: Sitting, Cuff Size: Normal)    Pulse 72    Ht 5\' 11"  (1.803 m)    Wt 265 lb (120.2 kg)    BMI 36.96 kg/m     ASSESSMENT & PLAN:    Coronary artery disease without angina: Derrick Gallegos continues to do well without significant chest pain or shortness of breath.  He is concerned about long-term anticoagulation, remaining on rivaroxaban and clopidogrel at this time.  He is approximately 7-1/2 months out from PCI to the LCx in the setting of stable ischemic heart disease.  We have discussed the risks and benefits of continuing clopidogrel and rivaroxaban.  We have agreed to discontinue clopidogrel and start aspirin 81 mg daily in addition to rivaroxaban.  I favor completing at least 1 year of antiplatelet therapy plus rivaroxaban before continuing discontinuation of aspirin.  We will continue to treat his hyperlipidemia, as outlined below.  Atrial flutter: No symptoms of recurrence following TEE guided cardioversion last year.  Unfortunately, the patient did not tolerate amiodarone or sotalol  due to marked fatigue.  He is doing well on low-dose metoprolol, which I think it is reasonable to continue.  We will also continue indefinite anticoagulation with rivaroxaban 20 mg daily.  Hyperlipidemia: LDL suboptimally controlled on last check, which prompted addition of ezetimibe in lieu of statins given intolerance of multiple agents.  Mr. Raineri reports that he had a lipid panel today through his PCP, which we will request.  If his LDL remains above 70, we would need to reconsider PCSK9 inhibitor therapy with pricing assistance through the manufacture.  Hypertension: Home blood pressure today is well controlled.  No medication changes.  COVID-19 Education: The signs and symptoms of COVID-19 were discussed with the patient and how to seek care for testing (follow up with PCP or arrange E-visit).  The importance of social distancing was discussed today.  Patient Risk:   After full review of this patient's clinical status, I feel that they are at least moderate risk at this time.  Time:   Today, I have spent 20 minutes with the patient with telehealth technology discussing coronary artery disease, antiplatelet therapy and anticoagulation, hyperlipidemia, and COVID-19 precautions.     Medication Adjustments/Labs and Tests Ordered: Current medicines are reviewed at length with the patient today.  Concerns regarding medicines are outlined above.   Tests Ordered: None.  Medication Changes: Meds ordered this encounter  Medications   aspirin EC 81 MG tablet    Sig: Take 1 tablet (81 mg total) by mouth daily.    Dispense:  90 tablet    Refill:  3    Disposition:  Follow up in 4 month(s)  Signed, [name], MD  03/31/2019 8:16 PM     [facility]

## 2019-03-31 ENCOUNTER — Telehealth (INDEPENDENT_AMBULATORY_CARE_PROVIDER_SITE_OTHER): Payer: Medicare Other | Admitting: Internal Medicine

## 2019-03-31 ENCOUNTER — Other Ambulatory Visit: Payer: Self-pay

## 2019-03-31 ENCOUNTER — Encounter: Payer: Self-pay | Admitting: Internal Medicine

## 2019-03-31 VITALS — BP 128/78 | HR 72 | Ht 71.0 in | Wt 265.0 lb

## 2019-03-31 DIAGNOSIS — I2510 Atherosclerotic heart disease of native coronary artery without angina pectoris: Secondary | ICD-10-CM | POA: Diagnosis not present

## 2019-03-31 DIAGNOSIS — E785 Hyperlipidemia, unspecified: Secondary | ICD-10-CM | POA: Diagnosis not present

## 2019-03-31 DIAGNOSIS — I10 Essential (primary) hypertension: Secondary | ICD-10-CM

## 2019-03-31 DIAGNOSIS — I4892 Unspecified atrial flutter: Secondary | ICD-10-CM | POA: Diagnosis not present

## 2019-03-31 MED ORDER — ASPIRIN EC 81 MG PO TBEC: 81.0000 mg | DELAYED_RELEASE_TABLET | Freq: Every day | ORAL | 3 refills | Status: DC

## 2019-03-31 NOTE — Patient Instructions (Addendum)
Medication Instructions:  Your physician has recommended you make the following change in your medication:  1- STOP Plavix. 2- START Aspirin 81 mg by mouth once a day. 3- Continue on Xarelto.   If you need a refill on your cardiac medications before your next appointment, please call your pharmacy.   Lab work: Labwork will be requested from your primary care physician.  If you have labs (blood work) drawn today and your tests are completely normal, you will receive your results only by: Marland Kitchen MyChart Message (if you have MyChart) OR . A paper copy in the mail If you have any lab test that is abnormal or we need to change your treatment, we will call you to review the results.  Testing/Procedures: none  Follow-Up: At [facility], you and your health needs are our priority.  As part of our continuing mission to provide you with exceptional heart care, we have created designated Provider Care Teams.  These Care Teams include your primary Cardiologist (physician) and Advanced Practice Providers (APPs -  Physician Assistants and Nurse Practitioners) who all work together to provide you with the care you need, when you need it. You will need a follow up appointment in 4 months (around mid-Aug).  Please call our office 2 months in advance to schedule this appointment.  You may see DR Harrell Gave END or one of the following Advanced Practice Providers on your designated Care Team:   Murray Hodgkins, NP Christell Faith, PA-C . Marrianne Mood, PA-C

## 2019-09-25 DIAGNOSIS — C3490 Malignant neoplasm of unspecified part of unspecified bronchus or lung: Secondary | ICD-10-CM

## 2019-09-25 HISTORY — DX: Malignant neoplasm of unspecified part of unspecified bronchus or lung: C34.90

## 2019-10-09 ENCOUNTER — Emergency Department: Payer: Medicare Other

## 2019-10-09 ENCOUNTER — Encounter: Payer: Self-pay | Admitting: Emergency Medicine

## 2019-10-09 ENCOUNTER — Other Ambulatory Visit: Payer: Self-pay

## 2019-10-09 ENCOUNTER — Emergency Department
Admission: EM | Admit: 2019-10-09 | Discharge: 2019-10-09 | Disposition: A | Discharge status: Home / Self Care | Payer: Medicare Other | Attending: Emergency Medicine | Admitting: Emergency Medicine

## 2019-10-09 ENCOUNTER — Encounter: Payer: Self-pay | Admitting: *Deleted

## 2019-10-09 DIAGNOSIS — R0602 Shortness of breath: Secondary | ICD-10-CM | POA: Diagnosis not present

## 2019-10-09 DIAGNOSIS — I10 Essential (primary) hypertension: Secondary | ICD-10-CM | POA: Insufficient documentation

## 2019-10-09 DIAGNOSIS — R079 Chest pain, unspecified: Secondary | ICD-10-CM | POA: Diagnosis present

## 2019-10-09 DIAGNOSIS — Z20828 Contact with and (suspected) exposure to other viral communicable diseases: Secondary | ICD-10-CM | POA: Diagnosis not present

## 2019-10-09 DIAGNOSIS — R05 Cough: Secondary | ICD-10-CM | POA: Insufficient documentation

## 2019-10-09 DIAGNOSIS — E119 Type 2 diabetes mellitus without complications: Secondary | ICD-10-CM | POA: Insufficient documentation

## 2019-10-09 DIAGNOSIS — R918 Other nonspecific abnormal finding of lung field: Secondary | ICD-10-CM | POA: Insufficient documentation

## 2019-10-09 DIAGNOSIS — Z951 Presence of aortocoronary bypass graft: Secondary | ICD-10-CM | POA: Insufficient documentation

## 2019-10-09 DIAGNOSIS — I2510 Atherosclerotic heart disease of native coronary artery without angina pectoris: Secondary | ICD-10-CM | POA: Diagnosis not present

## 2019-10-09 DIAGNOSIS — Z955 Presence of coronary angioplasty implant and graft: Secondary | ICD-10-CM | POA: Insufficient documentation

## 2019-10-09 DIAGNOSIS — R519 Headache, unspecified: Secondary | ICD-10-CM | POA: Diagnosis not present

## 2019-10-09 DIAGNOSIS — Z87891 Personal history of nicotine dependence: Secondary | ICD-10-CM | POA: Diagnosis not present

## 2019-10-09 LAB — BASIC METABOLIC PANEL
Anion gap: 14 (ref 5–15)
BUN: 16 mg/dL (ref 8–23)
CO2: 20 mmol/L — ABNORMAL LOW (ref 22–32)
Calcium: 9.6 mg/dL (ref 8.9–10.3)
Chloride: 103 mmol/L (ref 98–111)
Creatinine, Ser: 0.65 mg/dL (ref 0.61–1.24)
GFR calc Af Amer: >60 mL/min (ref >60)
GFR calc non Af Amer: >60 mL/min (ref >60)
Glucose, Bld: 144 mg/dL — ABNORMAL HIGH (ref 70–99)
Potassium: 3.4 mmol/L — ABNORMAL LOW (ref 3.5–5.1)
Sodium: 137 mmol/L (ref 135–145)

## 2019-10-09 LAB — SARS CORONAVIRUS 2 (TAT 6-24 HRS): SARS Coronavirus 2: NEGATIVE

## 2019-10-09 LAB — CBC
HCT: 45.5 % (ref 39.0–52.0)
Hemoglobin: 15.7 g/dL (ref 13.0–17.0)
MCH: 30.7 pg (ref 26.0–34.0)
MCHC: 34.5 g/dL (ref 30.0–36.0)
MCV: 88.9 fL (ref 80.0–100.0)
Platelets: 339 10*3/uL (ref 150–400)
RBC: 5.12 MIL/uL (ref 4.22–5.81)
RDW: 13 % (ref 11.5–15.5)
WBC: 8.1 10*3/uL (ref 4.0–10.5)
nRBC: 0 % (ref 0.0–0.2)

## 2019-10-09 LAB — TROPONIN I (HIGH SENSITIVITY)
Troponin I (High Sensitivity): 9 ng/L (ref <18)
Troponin I (High Sensitivity): 10 ng/L (ref <18)

## 2019-10-09 MED ORDER — IOHEXOL 300 MG/ML  SOLN
75.0000 mL | Freq: Once | INTRAMUSCULAR | Status: AC | PRN
  Administered 2019-10-09: 75 mL via INTRAVENOUS

## 2019-10-09 MED ORDER — HYDROCOD POLST-CPM POLST ER 10-8 MG/5ML PO SUER: 5.0000 mL | Freq: Two times a day (BID) | ORAL | 0 refills | Status: DC

## 2019-10-09 MED ORDER — IOHEXOL 9 MG/ML PO SOLN: 500.0000 mL | Freq: Once | ORAL | Status: DC

## 2019-10-09 MED ORDER — KETOROLAC TROMETHAMINE 30 MG/ML IJ SOLN
15.0000 mg | Freq: Once | INTRAMUSCULAR | Status: AC
  Administered 2019-10-09: 15 mg via INTRAVENOUS
  Filled 2019-10-09: qty 1

## 2019-10-09 NOTE — ED Provider Notes (Signed)
[facility] Emergency Department Provider Note       Time seen: ----------------------------------------- 12:48 PM on 10/09/2019 -----------------------------------------   I have reviewed the triage vital signs and the nursing notes.  HISTORY   Chief Complaint Chest Pain    HPI Derrick Gallegos is a 68 y.o. male with a history of anxiety, arthritis, atrial flutter, coronary artery disease, depression, diabetes, hyperlipidemia who presents to the ED for sharp chest pain radiating from the right to the left of his chest.  Patient states is been constant for the past 2 weeks, he has had persistent cough and shortness of breath that is worse with ambulation.  He describes pneumonia this year that improved some with antibiotics.  Pain is 6 out of 10.  Past Medical History:  Diagnosis Date  . Anxiety   . Arthritis   . Atrial flutter ([place])    a. diagnosed 5/19; b. s/p TEE/DCCV 05/2018 by Dr. Humphrey Rolls; c. CHADS2VASc => 4 (HTN, age x1, DM, vascular disease)  . Coronary artery disease    a. s/p 3-v cabg in 2004 at [place] (LIMA and radial arery used - no further details in Care Everywhere); b. LHC 8/19 underlying multi-V dz, patent LIMA-LAD, occluded VG-PDA, 90-95% stenosis of LCx s/p PCI/DES x 2  . Depression   . Diabetes mellitus with complication ([place])   . Essential hypertension   . Family history of premature CAD    a. father passed away from an MI at 70  . HLD (hyperlipidemia)    a. statin intolerant   . MI (myocardial infarction) ([place])   . Obesity     Patient Active Problem List   Diagnosis Date Noted  . Atrial flutter ([place]) 03/31/2019  . Unstable angina ([place]) 08/08/2018  . CAD (coronary artery disease) 08/08/2018  . Chest pain 11/16/2017  . Coronary artery disease   . Diabetes mellitus with complication ([place])   . Essential hypertension   . Family history of premature CAD   . Hyperlipidemia LDL goal <70   . Obesity     Past Surgical History:  Procedure  Laterality Date  . CARDIAC CATHETERIZATION  2004  . CARDIAC CATHETERIZATION  2019   x2 stents [facility]  . CARDIAC SURGERY    . CARDIOVERSION N/A 06/20/2018   Procedure: CARDIOVERSION;  Surgeon: Dionisio David, MD;  Location: [facility] ORS;  Service: Cardiovascular;  Laterality: N/A;  . CORONARY ARTERY BYPASS GRAFT    . CORONARY STENT INTERVENTION N/A 08/08/2018   Procedure: CORONARY STENT INTERVENTION;  Surgeon: Isaias Cowman, MD;  Location: [place] [facility];  Service: Cardiovascular;  Laterality: N/A;  . LEFT HEART CATH AND CORONARY ANGIOGRAPHY Left 08/08/2018   Procedure: LEFT HEART CATH AND CORONARY ANGIOGRAPHY;  Surgeon: Dionisio David, MD;  Location: [place] [facility];  Service: Cardiovascular;  Laterality: Left;  . TEE WITHOUT CARDIOVERSION N/A 06/20/2018   Procedure: TRANSESOPHAGEAL ECHOCARDIOGRAM (TEE);  Surgeon: Dionisio David, MD;  Location: [facility] ORS;  Service: Cardiovascular;  Laterality: N/A;    Allergies Patient has no known allergies.  Social History Social History   Tobacco Use  . Smoking status: Former Smoker    Types: Cigarettes  . Smokeless tobacco: Never Used  Substance Use Topics  . Alcohol use: No  . Drug use: No   Review of Systems Constitutional: Negative for fever. Cardiovascular: Positive for chest pain Respiratory: Positive for shortness of breath and cough Gastrointestinal: Negative for abdominal pain, vomiting and diarrhea. Musculoskeletal: Negative for back pain. Skin:  Negative for rash. Neurological: Negative for headaches, focal weakness or numbness.  All systems negative/normal/unremarkable except as stated in the HPI  ____________________________________________   PHYSICAL EXAM:  VITAL SIGNS: ED Triage Vitals  Enc Vitals Group     BP 10/09/19 1107 (!) 164/81     Pulse Rate 10/09/19 1107 79     Resp 10/09/19 1107 16     Temp 10/09/19 1107 98.3 F (36.8 C)     Temp Source 10/09/19 1107 Oral     SpO2 10/09/19 1107 97 %      Weight 10/09/19 1108 264 lb 15.9 oz (120.2 kg)     Height 10/09/19 1108 5\' 11"  (1.803 m)     Head Circumference --      Peak Flow --      Pain Score 10/09/19 1108 6     Pain Loc --      Pain Edu? --      Excl. in [place]? --    Constitutional: Alert and oriented. Well appearing and in no distress. Eyes: Conjunctivae are normal. Normal extraocular movements. Cardiovascular: Normal rate, regular rhythm. No murmurs, rubs, or gallops. Respiratory: Normal respiratory effort without tachypnea nor retractions. Breath sounds are clear and equal bilaterally. No wheezes/rales/rhonchi. Gastrointestinal: Soft and nontender. Normal bowel sounds Musculoskeletal: Nontender with normal range of motion in extremities. No lower extremity tenderness nor edema. Neurologic:  Normal speech and language. No gross focal neurologic deficits are appreciated.  Skin:  Skin is warm, dry and intact. No rash noted. Psychiatric: Mood and affect are normal. Speech and behavior are normal.  ____________________________________________  EKG: Interpreted by me.  Sinus rhythm with rate of 76 bpm, right bundle branch block, normal axis, normal QT  ____________________________________________  ED COURSE:  As part of my medical decision making, I reviewed the following data within the [place] History obtained from family if available, nursing notes, old chart and ekg, as well as notes from prior ED visits. Patient presented for chest pain as well as shortness of breath and cough, we will assess with labs and imaging as indicated at this time.   Procedures  Derrick Gallegos was evaluated in Emergency Department on 10/09/2019 for the symptoms described in the history of present illness. He was evaluated in the context of the global COVID-19 pandemic, which necessitated consideration that the patient might be at risk for infection with the SARS-CoV-2 virus that causes COVID-19. Institutional protocols and algorithms  that pertain to the evaluation of patients at risk for COVID-19 are in a state of rapid change based on information released by regulatory bodies including the CDC and federal and state organizations. These policies and algorithms were followed during the patient's care in the ED.  ____________________________________________   LABS (pertinent positives/negatives)  Labs Reviewed  BASIC METABOLIC PANEL - Abnormal; Notable for the following components:      Result Value   Potassium 3.4 (*)    CO2 20 (*)    Glucose, Bld 144 (*)    All other components within normal limits  SARS CORONAVIRUS 2 (TAT 6-24 HRS)  CBC  TROPONIN I (HIGH SENSITIVITY)  TROPONIN I (HIGH SENSITIVITY)    RADIOLOGY Images were viewed by me CXR IMPRESSION: Left upper prominent density is noted. Although pneumonia could present in this fashion, this density is worrisome for mass lesion. Follow-up PA and lateral chest x-ray suggested to demonstrate resolution. If these findings do not resolve chest CT suggested for further evaluation as malignancy cannot be excluded.  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 PET scan is recommended for  further evaluation.   Left paratracheal, precarinal, subcarinal, prevascular and left  hilar adenopathy is noted concerning for metastatic disease.   Status post coronary bypass graft.  ____________________________________________   DIFFERENTIAL DIAGNOSIS   Pneumonia, unstable angina, MI, PE, anxiety  FINAL ASSESSMENT AND PLAN  Chest pain, lung mass   Plan: The patient had presented for chest pain and shortness of breath with cough. Patient's labs were reassuring. Patient's imaging initially revealed a left upper lobe prominent density which could represent pneumonia.  CT of the chest was performed which revealed a  3.4 cm left perihilar mass concerning for malignancy.  I discussed with oncology on-call and they will see him tomorrow in follow-up and arrange for biopsy and further testing.  Patient is agreeable to plan.   Laurence Aly, MD    Note: This note was generated in part or whole with voice recognition software. Voice recognition is usually quite accurate but there are transcription errors that can and very often do occur. I apologize for any typographical errors that were not detected and corrected.     Earleen Newport, MD 10/09/19 1455

## 2019-10-09 NOTE — ED Triage Notes (Signed)
Pt reports sharp chest radiating from the right to left side of pt's chest. Pt reports pain has been present and constant for 2 weeks. Pt reports today he became short of breath with ambulation and decided to come be evaluated in the ED.

## 2019-10-09 NOTE — ED Notes (Signed)
Pt c/o headache and request medication - Dr Jimmye Norman notified

## 2019-10-09 NOTE — Progress Notes (Signed)
  Oncology Nurse Navigator Documentation  Navigator Location: [facility]Med [facility]c (10/09/19 1600) Referral Date to RadOnc/MedOnc: 10/09/19 (10/09/19 1600) )Navigator Encounter Type: Introductory Phone Call (10/09/19 1600)   Abnormal Finding Date: 10/09/19 (10/09/19 1600)                   Treatment Phase: Abnormal Scans (10/09/19 1600) Barriers/Navigation Needs: Coordination of Care (10/09/19 1600)   Interventions: Coordination of Care (10/09/19 1600)   Coordination of Care: Appts (10/09/19 1600)        Acuity: Level 2-Minimal Needs (1-2 Barriers Identified) (10/09/19 1600)    phone call made to patient to introduce to navigator services and review upcoming appts. Pt made aware of new consult appt with Dr. Janese Banks on Friday 10/16 at 9:30am. Pt is aware of visitor restrictions. Verbalized understanding. Will follow up with pt at his next visit.      Time Spent with Patient: 30 (10/09/19 1600)

## 2019-10-10 ENCOUNTER — Encounter: Payer: Self-pay | Admitting: Oncology

## 2019-10-10 ENCOUNTER — Other Ambulatory Visit: Payer: Self-pay

## 2019-10-10 ENCOUNTER — Other Ambulatory Visit: Payer: Self-pay | Admitting: *Deleted

## 2019-10-10 ENCOUNTER — Inpatient Hospital Stay: Payer: Medicare Other | Attending: Oncology | Admitting: Oncology

## 2019-10-10 ENCOUNTER — Encounter: Payer: Self-pay | Admitting: *Deleted

## 2019-10-10 VITALS — BP 129/80 | HR 64 | Temp 95.2°F | Ht 71.0 in | Wt 250.0 lb

## 2019-10-10 DIAGNOSIS — I10 Essential (primary) hypertension: Secondary | ICD-10-CM | POA: Insufficient documentation

## 2019-10-10 DIAGNOSIS — M1990 Unspecified osteoarthritis, unspecified site: Secondary | ICD-10-CM | POA: Diagnosis not present

## 2019-10-10 DIAGNOSIS — R0609 Other forms of dyspnea: Secondary | ICD-10-CM | POA: Diagnosis not present

## 2019-10-10 DIAGNOSIS — R918 Other nonspecific abnormal finding of lung field: Secondary | ICD-10-CM | POA: Insufficient documentation

## 2019-10-10 DIAGNOSIS — F418 Other specified anxiety disorders: Secondary | ICD-10-CM | POA: Diagnosis not present

## 2019-10-10 DIAGNOSIS — I252 Old myocardial infarction: Secondary | ICD-10-CM | POA: Insufficient documentation

## 2019-10-10 DIAGNOSIS — Z7984 Long term (current) use of oral hypoglycemic drugs: Secondary | ICD-10-CM | POA: Diagnosis not present

## 2019-10-10 DIAGNOSIS — Z7901 Long term (current) use of anticoagulants: Secondary | ICD-10-CM | POA: Diagnosis not present

## 2019-10-10 DIAGNOSIS — Z87891 Personal history of nicotine dependence: Secondary | ICD-10-CM | POA: Diagnosis not present

## 2019-10-10 DIAGNOSIS — I4892 Unspecified atrial flutter: Secondary | ICD-10-CM | POA: Insufficient documentation

## 2019-10-10 DIAGNOSIS — E785 Hyperlipidemia, unspecified: Secondary | ICD-10-CM | POA: Insufficient documentation

## 2019-10-10 DIAGNOSIS — R5383 Other fatigue: Secondary | ICD-10-CM | POA: Insufficient documentation

## 2019-10-10 DIAGNOSIS — E1159 Type 2 diabetes mellitus with other circulatory complications: Secondary | ICD-10-CM | POA: Diagnosis not present

## 2019-10-10 DIAGNOSIS — R05 Cough: Secondary | ICD-10-CM | POA: Insufficient documentation

## 2019-10-10 DIAGNOSIS — R590 Localized enlarged lymph nodes: Secondary | ICD-10-CM | POA: Diagnosis not present

## 2019-10-10 DIAGNOSIS — I2510 Atherosclerotic heart disease of native coronary artery without angina pectoris: Secondary | ICD-10-CM | POA: Diagnosis not present

## 2019-10-10 DIAGNOSIS — Z9981 Dependence on supplemental oxygen: Secondary | ICD-10-CM | POA: Insufficient documentation

## 2019-10-10 DIAGNOSIS — Z7982 Long term (current) use of aspirin: Secondary | ICD-10-CM | POA: Diagnosis not present

## 2019-10-10 DIAGNOSIS — N2889 Other specified disorders of kidney and ureter: Secondary | ICD-10-CM | POA: Insufficient documentation

## 2019-10-10 DIAGNOSIS — Z79899 Other long term (current) drug therapy: Secondary | ICD-10-CM | POA: Diagnosis not present

## 2019-10-10 DIAGNOSIS — Z8249 Family history of ischemic heart disease and other diseases of the circulatory system: Secondary | ICD-10-CM | POA: Insufficient documentation

## 2019-10-10 DIAGNOSIS — Z951 Presence of aortocoronary bypass graft: Secondary | ICD-10-CM | POA: Diagnosis not present

## 2019-10-10 DIAGNOSIS — R5381 Other malaise: Secondary | ICD-10-CM | POA: Diagnosis not present

## 2019-10-10 MED ORDER — LORAZEPAM 0.5 MG PO TABS: ORAL_TABLET | ORAL | 0 refills | Status: DC

## 2019-10-10 NOTE — Progress Notes (Signed)
  Oncology Nurse Navigator Documentation  Navigator Location: [facility] (10/10/19 1100)   )Navigator Encounter Type: Clinic/MDC (10/10/19 1100)               Multidisiplinary Clinic Date: 10/10/19 (10/10/19 1100) Multidisiplinary Clinic Type: Thoracic (10/10/19 1100)   Patient Visit Type: MedOnc (10/10/19 1100)   Barriers/Navigation Needs: Coordination of Care (10/10/19 1100)   Interventions: Coordination of Care (10/10/19 1100)   Coordination of Care: Appts;Radiology (10/10/19 1100)           met with patient during initial med-onc consultation with Dr. Janese Banks. All questions answered during visit. Reviewed upcoming appts with pt. Informed that we will be in touch after his PET scan on Tues 10/20 to discuss next steps regarding biopsy. Contact info given and instructed to call with any further questions or needs. Pt verbalized understanding. Nothing further needed at this time.       Time Spent with Patient: 45 (10/10/19 1100)

## 2019-10-10 NOTE — Progress Notes (Signed)
Patient stated that he went to the walk-in clinic on 06/26/2019. Patient brought a CD to show.

## 2019-10-12 ENCOUNTER — Encounter: Payer: Self-pay | Admitting: Oncology

## 2019-10-12 NOTE — Progress Notes (Signed)
Hematology/Oncology Consult note [facility] [phone] [phone]  Patient Care Team: Center, [facility] as PCP - General (General Practice)   Name of the patient: Derrick Gallegos  [id number]  24-Oct-1951    Reason for referral-lung mass/mediastinal adenopathy   Referring physician-Dr. Lenise Arena   Date of visit: 10/12/19   History of presenting illness- patient is a 68 year old male who was a former smoker and smoked about 2 to 3 packs/day for many years and quit smoking a few years ago.  He presented to the ER with symptoms of cough and shortness of breathWhich prompted [place] chest x-ray followed by a CT scan.  CT scan showed a 3.4 x 3.2 cm left perihilar mass along the left paratracheal precarinal and subcarinal vascular and left hilar adenopathy.  Patient has been referred for further management.   Patient states that his appetite is good and he denies any unintentional weight loss.  He reports occasional exertional shortness of breath and nonproductive cough.  Denies any pain.  He has hypertension hyperlipidemia and type 2 diabetes among his comorbidities.  No known history of COPD and he is on home oxygen  ECOG PS- 1  Pain scale- 0   Review of systems- Review of Systems  Constitutional: Positive for malaise/fatigue. Negative for chills, fever and weight loss.  HENT: Negative for congestion, ear discharge and nosebleeds.   Eyes: Negative for blurred vision.  Respiratory: Positive for cough and shortness of breath. Negative for hemoptysis, sputum production and wheezing.   Cardiovascular: Negative for chest pain, palpitations, orthopnea and claudication.  Gastrointestinal: Negative for abdominal pain, blood in stool, constipation, diarrhea, heartburn, melena, nausea and vomiting.  Genitourinary: Negative for dysuria, flank pain, frequency, hematuria and urgency.  Musculoskeletal: Negative for back pain, joint pain  and myalgias.  Skin: Negative for rash.  Neurological: Negative for dizziness, tingling, focal weakness, seizures, weakness and headaches.  Endo/Heme/Allergies: Does not bruise/bleed easily.  Psychiatric/Behavioral: Negative for depression and suicidal ideas. The patient does not have insomnia.     No Known Allergies  Patient Active Problem List   Diagnosis Date Noted   Atrial flutter ([place]) 03/31/2019   Unstable angina ([place]) 08/08/2018   CAD (coronary artery disease) 08/08/2018   Coronary artery disease    Family history of premature CAD    Hyperlipidemia LDL goal <70    Obesity    Chest pain 03/01/2015   DMII (diabetes mellitus, type 2) ([place]) 03/01/2015   HTN (hypertension) 03/01/2015   History of myocardial infarct at age less than 82 years 03/01/2015   Pre-op evaluation 12/28/2014   Chronic knee pain 10/24/2013     Past Medical History:  Diagnosis Date   Anxiety    Arthritis    Atrial flutter ([place])    a. diagnosed 5/19; b. s/p TEE/DCCV 05/2018 by Dr. Humphrey Rolls; c. CHADS2VASc => 4 (HTN, age x1, DM, vascular disease)   Coronary artery disease    a. s/p 3-v cabg in 2004 at [place] (LIMA and radial arery used - no further details in Care Everywhere); b. LHC 8/19 underlying multi-V dz, patent LIMA-LAD, occluded VG-PDA, 90-95% stenosis of LCx s/p PCI/DES x 2   Depression    Diabetes mellitus with complication (HCC)    Essential hypertension    Family history of premature CAD    a. father passed away from an MI at 58   HLD (hyperlipidemia)    a. statin intolerant    MI (myocardial infarction) ([place])  Obesity      Past Surgical History:  Procedure Laterality Date   CARDIAC CATHETERIZATION  2004   CARDIAC CATHETERIZATION  2019   x2 stents [place] N/A 06/20/2018   Procedure: CARDIOVERSION;  Surgeon: Dionisio David, MD;  Location: [facility] ORS;  Service: Cardiovascular;  Laterality: N/A;   CORONARY ARTERY BYPASS GRAFT       CORONARY STENT INTERVENTION N/A 08/08/2018   Procedure: CORONARY STENT INTERVENTION;  Surgeon: Isaias Cowman, MD;  Location: [place] [facility];  Service: Cardiovascular;  Laterality: N/A;   LEFT HEART CATH AND CORONARY ANGIOGRAPHY Left 08/08/2018   Procedure: LEFT HEART CATH AND CORONARY ANGIOGRAPHY;  Surgeon: Dionisio David, MD;  Location: [place] CV [facility];  Service: Cardiovascular;  Laterality: Left;   TEE WITHOUT CARDIOVERSION N/A 06/20/2018   Procedure: TRANSESOPHAGEAL ECHOCARDIOGRAM (TEE);  Surgeon: Dionisio David, MD;  Location: [facility] ORS;  Service: Cardiovascular;  Laterality: N/A;    Social History   Socioeconomic History   Marital status: Married    Spouse name: Not on file   Number of children: Not on file   Years of education: Not on file   Highest education level: Not on file  Occupational History   Not on file  Social Needs   Financial resource strain: Not on file   Food insecurity    Worry: Not on file    Inability: Not on file   Transportation needs    Medical: Not on file    Non-medical: Not on file  Tobacco Use   Smoking status: Former Smoker    Types: Cigarettes   Smokeless tobacco: Never Used  Substance and Sexual Activity   Alcohol use: No   Drug use: No   Sexual activity: Not on file  Lifestyle   Physical activity    Days per week: Not on file    Minutes per session: Not on file   Stress: Not on file  Relationships   Social connections    Talks on phone: Not on file    Gets together: Not on file    Attends religious service: Not on file    Active member of club or organization: Not on file    Attends meetings of clubs or organizations: Not on file    Relationship status: Not on file   Intimate partner violence    Fear of current or ex partner: Not on file    Emotionally abused: Not on file    Physically abused: Not on file    Forced sexual activity: Not on file  Other Topics Concern   Not on file  Social  History Narrative   Not on file     Family History  Problem Relation Age of Onset   CAD Mother    CAD Father        a. MI at 57-->death   CAD Brother      Current Outpatient Medications:    acetaminophen (TYLENOL) 500 MG tablet, Take 500 mg by mouth every 6 (six) hours as needed., Disp: , Rfl:    aspirin EC 81 MG tablet, Take 1 tablet (81 mg total) by mouth daily., Disp: 90 tablet, Rfl: 3   chlorpheniramine-HYDROcodone (TUSSIONEX PENNKINETIC ER) 10-8 MG/5ML SUER, Take 5 mLs by mouth 2 (two) times daily., Disp: 140 mL, Rfl: 0   gabapentin (NEURONTIN) 300 MG capsule, Take 1 capsule by mouth daily., Disp: , Rfl:    glipiZIDE (GLUCOTROL XL) 5 MG  24 hr tablet, Take 5 mg by mouth at bedtime. , Disp: , Rfl:    JARDIANCE 10 MG TABS tablet, Take 1 tablet by mouth daily., Disp: , Rfl:    lisinopril (PRINIVIL,ZESTRIL) 40 MG tablet, Take 40 mg by mouth at bedtime., Disp: , Rfl:    losartan (COZAAR) 50 MG tablet, Take 1 tablet by mouth daily., Disp: , Rfl:    metoprolol tartrate (LOPRESSOR) 25 MG tablet, Take 1 tablet (25 mg total) by mouth 2 (two) times daily., Disp: 180 tablet, Rfl: 0   Omega-3 Fatty Acids (FISH OIL PO), Take 2 capsules by mouth at bedtime. , Disp: , Rfl:    omeprazole (PRILOSEC) 20 MG capsule, Take 1 capsule by mouth as needed., Disp: , Rfl:    rivaroxaban (XARELTO) 20 MG TABS tablet, Take 1 tablet (20 mg total) by mouth daily with supper., Disp: 90 tablet, Rfl: 2   LORazepam (ATIVAN) 0.5 MG tablet, Take 2 tabs prior to MRI, may take an additional tablet if needed, Disp: 3 tablet, Rfl: 0   Physical exam:  Vitals:   10/10/19 0945  BP: 129/80  Pulse: 64  Temp: (!) 95.2 F (35.1 C)  TempSrc: Tympanic  Weight: 250 lb (113.4 kg)  Height: 5\' 11"  (1.803 m)   Physical Exam HENT:     Head: Normocephalic and atraumatic.  Eyes:     Pupils: Pupils are equal, round, and reactive to light.  Neck:     Musculoskeletal: Normal range of motion.  Cardiovascular:      Rate and Rhythm: Normal rate and regular rhythm.     Heart sounds: Normal heart sounds.  Pulmonary:     Effort: Pulmonary effort is normal.     Breath sounds: Normal breath sounds.  Abdominal:     General: Bowel sounds are normal.     Palpations: Abdomen is soft.  Skin:    General: Skin is warm and dry.  Neurological:     Mental Status: He is alert and oriented to person, place, and time.        CMP Latest Ref Rng & Units 10/09/2019  Glucose 70 - 99 mg/dL 144(H)  BUN 8 - 23 mg/dL 16  Creatinine 0.61 - 1.24 mg/dL 0.65  Sodium 135 - 145 mmol/L 137  Potassium 3.5 - 5.1 mmol/L 3.4(L)  Chloride 98 - 111 mmol/L 103  CO2 22 - 32 mmol/L 20(L)  Calcium 8.9 - 10.3 mg/dL 9.6  Total Protein 6.0 - 8.5 g/dL -  Total Bilirubin 0.0 - 1.2 mg/dL -  Alkaline Phos 39 - 117 IU/L -  AST 0 - 40 IU/L -  ALT 0 - 44 IU/L -   CBC Latest Ref Rng & Units 10/09/2019  WBC 4.0 - 10.5 K/uL 8.1  Hemoglobin 13.0 - 17.0 g/dL 15.7  Hematocrit 39.0 - 52.0 % 45.5  Platelets 150 - 400 K/uL 339    No images are attached to the encounter.  Dg Chest 2 View  Result Date: 10/09/2019 CLINICAL DATA:  Chest pain. EXAM: CHEST - 2 VIEW COMPARISON:  11/27/2018. FINDINGS: Prior CABG. Fractured median sternotomy wires again noted and are unchanged. Heart size normal. Left upper lobe density is noted. Although pneumonia could present this fashion this density is worrisome for mass lesion. Follow-up PA and lateral chest x-ray suggested to demonstrate resolution. If these findings do not resolve chest CT suggested for further evaluation. IMPRESSION: Left upper prominent density is noted. Although pneumonia could present in this fashion, this density is worrisome for  mass lesion. Follow-up PA and lateral chest x-ray suggested to demonstrate resolution. If these findings do not resolve chest CT suggested for further evaluation as malignancy cannot be excluded. Electronically Signed   By: [place]   On: 10/09/2019 12:46     Ct Chest W Contrast  Result Date: 10/09/2019 CLINICAL DATA:  Chest pain.  Shortness of breath. EXAM: CT CHEST WITH CONTRAST TECHNIQUE: Multidetector CT imaging of the chest was performed during intravenous contrast administration. CONTRAST:  34mL OMNIPAQUE IOHEXOL 300 MG/ML  SOLN COMPARISON:  Radiograph of same day. FINDINGS: Cardiovascular: No evidence of thoracic aortic dissection or aneurysm. Status post coronary bypass graft. Normal cardiac size. No pericardial effusion. Mediastinum/Nodes: Thyroid gland and esophagus are unremarkable. 3 cm precarinal lymph node is noted. 2.3 cm left paratracheal lymph node is noted. 14 mm subcarinal lymph node is noted. 14 mm prevascular lymph node is noted. 15 mm left hilar lymph node is noted. Lungs/Pleura: No pneumothorax or pleural effusion is noted. Right lung is clear. 3.4 x 3.2 cm left perihilar mass is noted concerning for malignancy, with resulting postobstructive atelectasis of the left upper lobe. Upper Abdomen: No acute abnormality. Musculoskeletal: No chest wall abnormality. No acute or significant osseous findings. IMPRESSION: Approximately 3.4 cm left perihilar mass is noted concerning for malignancy, which results in some degree of postobstructive atelectasis of the left upper lobe. PET scan is recommended for further evaluation. Left paratracheal, precarinal, subcarinal, prevascular and left hilar adenopathy is noted concerning for metastatic disease. Status post coronary bypass graft. Electronically Signed   By: Marijo Conception M.D.   On: 10/09/2019 14:35    Assessment and plan- Patient is a 68 y.o. male with past medical history significant for hypertension hyperlipidemia and type 2 diabetes presenting with left hilar lung mass and local regional adenopathy  I have reviewed CT chest images independently and discussed findings with the patient and his daughter.  Patient found to have left hilar lung mass along with mediastinal and hilar adenopathy  concerning for lung cancer.  At this time I would recommend a PET CT scan to complete his staging work-up to identify potential hypermetabolic areas that can be biopsied.  He also needs an MRI brain with and without contrast to complete his staging work-up.  Patient reports that he is highly claustrophobic and would need Ativan prior to his MRI.  I will give him 1 mg oral Ativan to be taken 30 minutes prior to procedure and he can take additional 0.5 mg if he continues to be claustrophobic.  His PET CT scan shows no evidence of distant metastatic disease I will refer him to pulmonary for bronchoscopy and biopsy.  Discussed with patient and his daughter that in the absence of distant metastatic disease he likely has stage III lung cancer which can be treated with concurrent chemoradiation.  Details of treatment will be finalized after his biopsy results are back.  I have not seen him back in 2 weeks time   Thank you for this kind referral and the opportunity to participate in the care of this  Patient   Visit Diagnosis 1. Lung mass     [name], MD, MPH [facility] [phone] 10/12/2019

## 2019-10-13 ENCOUNTER — Other Ambulatory Visit: Payer: Self-pay | Admitting: *Deleted

## 2019-10-13 MED ORDER — LORAZEPAM 0.5 MG PO TABS: 0.5000 mg | ORAL_TABLET | Freq: Four times a day (QID) | ORAL | 0 refills | Status: DC | PRN

## 2019-10-14 ENCOUNTER — Other Ambulatory Visit: Payer: Self-pay

## 2019-10-14 ENCOUNTER — Encounter
Admission: RE | Admit: 2019-10-14 | Discharge: 2019-10-14 | Disposition: A | Discharge status: Home / Self Care | Payer: Medicare Other | Source: Ambulatory Visit | Attending: Oncology | Admitting: Oncology

## 2019-10-14 ENCOUNTER — Encounter: Payer: Self-pay | Admitting: *Deleted

## 2019-10-14 DIAGNOSIS — I2510 Atherosclerotic heart disease of native coronary artery without angina pectoris: Secondary | ICD-10-CM | POA: Insufficient documentation

## 2019-10-14 DIAGNOSIS — I700 Atherosclerosis of aorta: Secondary | ICD-10-CM | POA: Insufficient documentation

## 2019-10-14 DIAGNOSIS — R590 Localized enlarged lymph nodes: Secondary | ICD-10-CM | POA: Insufficient documentation

## 2019-10-14 DIAGNOSIS — R918 Other nonspecific abnormal finding of lung field: Secondary | ICD-10-CM

## 2019-10-14 LAB — GLUCOSE, CAPILLARY: Glucose-Capillary: 156 mg/dL — ABNORMAL HIGH (ref 70–99)

## 2019-10-14 MED ORDER — FLUDEOXYGLUCOSE F - 18 (FDG) INJECTION
13.0000 | Freq: Once | INTRAVENOUS | Status: AC | PRN
  Administered 2019-10-14: 13.6 via INTRAVENOUS

## 2019-10-16 ENCOUNTER — Encounter: Payer: Self-pay | Admitting: Oncology

## 2019-10-16 ENCOUNTER — Inpatient Hospital Stay (HOSPITAL_BASED_OUTPATIENT_CLINIC_OR_DEPARTMENT_OTHER): Payer: Medicare Other | Admitting: Oncology

## 2019-10-16 DIAGNOSIS — R590 Localized enlarged lymph nodes: Secondary | ICD-10-CM | POA: Diagnosis not present

## 2019-10-16 DIAGNOSIS — R918 Other nonspecific abnormal finding of lung field: Secondary | ICD-10-CM

## 2019-10-16 DIAGNOSIS — N2889 Other specified disorders of kidney and ureter: Secondary | ICD-10-CM

## 2019-10-16 NOTE — Progress Notes (Signed)
Pt scheduled for doximity visit, pt had no concerns or questions.  Awaiting text to discuss recent test results.  Pt reports stopped xarleto a month ago, states "I didn't like how it made me feel or all the bruises I had".

## 2019-10-17 NOTE — Progress Notes (Signed)
I connected with Derrick Gallegos on 10/17/19 at  2:45 PM EDT by video enabled telemedicine visit and verified that I am speaking with the correct person using two identifiers.   I discussed the limitations, risks, security and privacy concerns of performing an evaluation and management service by telemedicine and the availability of in-person appointments. I also discussed with the patient that there may be a patient responsible charge related to this service. The patient expressed understanding and agreed to proceed.  Other persons participating in the visit and their role in the encounter:  none  Patient's location:  home Provider's location:  work  Risk analyst Complaint: Discuss PET CT scan results and further management  History of present illness: patient is a 68 year old male who was a former smoker and smoked about 2 to 3 packs/day for many years and quit smoking a few years ago.  He presented to the ER with symptoms of cough and shortness of breathWhich prompted [place] chest x-ray followed by a CT scan.  CT scan showed a 3.4 x 3.2 cm left perihilar mass along the left paratracheal precarinal and subcarinal vascular and left hilar adenopathy.  Patient has been referred for further management.   PET CT scan showed a large left upper lobe lung mass measuring 6.3 x 3.4 cm.  Bilateral mediastinal adenopathy as well as left supraclavicular adenopathy. 3.4 cm partially exophytic lesion of the right kidney.  No evidence of distant metastatic disease.  Interval history : Feels anxious about his ongoing work-up.  Denies other complaints at this time other than chronic fatigue and exertional shortness of breath.   Review of Systems  Constitutional: Positive for malaise/fatigue. Negative for chills, fever and weight loss.  HENT: Negative for congestion, ear discharge and nosebleeds.   Eyes: Negative for blurred vision.  Respiratory: Positive for shortness of breath. Negative for cough, hemoptysis, sputum  production and wheezing.   Cardiovascular: Negative for chest pain, palpitations, orthopnea and claudication.  Gastrointestinal: Negative for abdominal pain, blood in stool, constipation, diarrhea, heartburn, melena, nausea and vomiting.  Genitourinary: Negative for dysuria, flank pain, frequency, hematuria and urgency.  Musculoskeletal: Negative for back pain, joint pain and myalgias.  Skin: Negative for rash.  Neurological: Negative for dizziness, tingling, focal weakness, seizures, weakness and headaches.  Endo/Heme/Allergies: Does not bruise/bleed easily.  Psychiatric/Behavioral: Negative for depression and suicidal ideas. The patient is nervous/anxious. The patient does not have insomnia.     No Known Allergies  Past Medical History:  Diagnosis Date  . Anxiety   . Arthritis   . Atrial flutter ([place])    a. diagnosed 5/19; b. s/p TEE/DCCV 05/2018 by Dr. Humphrey Rolls; c. CHADS2VASc => 4 (HTN, age x1, DM, vascular disease)  . Coronary artery disease    a. s/p 3-v cabg in 2004 at [facility] (LIMA and radial arery used - no further details in Care Everywhere); b. LHC 8/19 underlying multi-V dz, patent LIMA-LAD, occluded VG-PDA, 90-95% stenosis of LCx s/p PCI/DES x 2  . Depression   . Diabetes mellitus with complication ([place])   . Essential hypertension   . Family history of premature CAD    a. father passed away from an MI at 80  . HLD (hyperlipidemia)    a. statin intolerant   . MI (myocardial infarction) ([place])   . Obesity     Past Surgical History:  Procedure Laterality Date  . CARDIAC CATHETERIZATION  2004  . CARDIAC CATHETERIZATION  2019   x2 stents [facility]  . CARDIAC SURGERY    .  CARDIOVERSION N/A 06/20/2018   Procedure: CARDIOVERSION;  Surgeon: Dionisio David, MD;  Location: [facility] ORS;  Service: Cardiovascular;  Laterality: N/A;  . CORONARY ARTERY BYPASS GRAFT    . CORONARY STENT INTERVENTION N/A 08/08/2018   Procedure: CORONARY STENT INTERVENTION;  Surgeon: Isaias Cowman, MD;   Location: [facility];  Service: Cardiovascular;  Laterality: N/A;  . LEFT HEART CATH AND CORONARY ANGIOGRAPHY Left 08/08/2018   Procedure: LEFT HEART CATH AND CORONARY ANGIOGRAPHY;  Surgeon: Dionisio David, MD;  Location: [facility];  Service: Cardiovascular;  Laterality: Left;  . TEE WITHOUT CARDIOVERSION N/A 06/20/2018   Procedure: TRANSESOPHAGEAL ECHOCARDIOGRAM (TEE);  Surgeon: Dionisio David, MD;  Location: [facility];  Service: Cardiovascular;  Laterality: N/A;    Social History   Socioeconomic History  . Marital status: Married    Spouse name: Not on file  . Number of children: Not on file  . Years of education: Not on file  . Highest education level: Not on file  Occupational History  . Not on file  Social Needs  . Financial resource strain: Not on file  . Food insecurity    Worry: Not on file    Inability: Not on file  . Transportation needs    Medical: Not on file    Non-medical: Not on file  Tobacco Use  . Smoking status: Former Smoker    Types: Cigarettes  . Smokeless tobacco: Never Used  Substance and Sexual Activity  . Alcohol use: No  . Drug use: No  . Sexual activity: Not on file  Lifestyle  . Physical activity    Days per week: Not on file    Minutes per session: Not on file  . Stress: Not on file  Relationships  . Social Herbalist on phone: Not on file    Gets together: Not on file    Attends religious service: Not on file    Active member of club or organization: Not on file    Attends meetings of clubs or organizations: Not on file    Relationship status: Not on file  . Intimate partner violence    Fear of current or ex partner: Not on file    Emotionally abused: Not on file    Physically abused: Not on file    Forced sexual activity: Not on file  Other Topics Concern  . Not on file  Social History Narrative  . Not on file    Family History  Problem Relation Age of Onset  . CAD Mother   . CAD Father        a.  MI at 57-->death  . CAD Brother      Current Outpatient Medications:  .  acetaminophen (TYLENOL) 500 MG tablet, Take 500 mg by mouth every 6 (six) hours as needed., Disp: , Rfl:  .  aspirin EC 81 MG tablet, Take 1 tablet (81 mg total) by mouth daily., Disp: 90 tablet, Rfl: 3 .  empagliflozin (JARDIANCE) 25 MG TABS tablet, Take 25 mg by mouth daily., Disp: , Rfl:  .  gabapentin (NEURONTIN) 300 MG capsule, Take 1 capsule by mouth daily., Disp: , Rfl:  .  glipiZIDE (GLUCOTROL) 10 MG tablet, Take 10 mg by mouth 2 (two) times daily before a meal., Disp: , Rfl:  .  lisinopril (PRINIVIL,ZESTRIL) 40 MG tablet, Take 40 mg by mouth at bedtime., Disp: , Rfl:  .  LORazepam (ATIVAN) 0.5 MG tablet, Take 1 tablet (0.5  mg total) by mouth every 6 (six) hours as needed for anxiety., Disp: 30 tablet, Rfl: 0 .  losartan (COZAAR) 50 MG tablet, Take 1 tablet by mouth daily., Disp: , Rfl:  .  metoprolol tartrate (LOPRESSOR) 25 MG tablet, Take 1 tablet (25 mg total) by mouth 2 (two) times daily., Disp: 180 tablet, Rfl: 0 .  Omega-3 Fatty Acids (FISH OIL PO), Take 2 capsules by mouth at bedtime. , Disp: , Rfl:  .  omeprazole (PRILOSEC) 20 MG capsule, Take 1 capsule by mouth as needed., Disp: , Rfl:  .  chlorpheniramine-HYDROcodone (TUSSIONEX PENNKINETIC ER) 10-8 MG/5ML SUER, Take 5 mLs by mouth 2 (two) times daily. (Patient not taking: Reported on 10/16/2019), Disp: 140 mL, Rfl: 0 .  LORazepam (ATIVAN) 0.5 MG tablet, Take 2 tabs prior to MRI, may take an additional tablet if needed (Patient not taking: Reported on 10/16/2019), Disp: 3 tablet, Rfl: 0 .  rivaroxaban (XARELTO) 20 MG TABS tablet, Take 1 tablet (20 mg total) by mouth daily with supper. (Patient not taking: Reported on 10/16/2019), Disp: 90 tablet, Rfl: 2  Dg Chest 2 View  Result Date: 10/09/2019 CLINICAL DATA:  Chest pain. EXAM: CHEST - 2 VIEW COMPARISON:  11/27/2018. FINDINGS: Prior CABG. Fractured median sternotomy wires again noted and are unchanged.  Heart size normal. Left upper lobe density is noted. Although pneumonia could present this fashion this density is worrisome for mass lesion. Follow-up PA and lateral chest x-ray suggested to demonstrate resolution. If these findings do not resolve chest CT suggested for further evaluation. IMPRESSION: Left upper prominent density is noted. Although pneumonia could present in this fashion, this density is worrisome for mass lesion. Follow-up PA and lateral chest x-ray suggested to demonstrate resolution. If these findings do not resolve chest CT suggested for further evaluation as malignancy cannot be excluded. Electronically Signed   By: [place]   On: 10/09/2019 12:46   Ct Chest W Contrast  Result Date: 10/09/2019 CLINICAL DATA:  Chest pain.  Shortness of breath. EXAM: CT CHEST WITH CONTRAST TECHNIQUE: Multidetector CT imaging of the chest was performed during intravenous contrast administration. CONTRAST:  39mL OMNIPAQUE IOHEXOL 300 MG/ML  SOLN COMPARISON:  Radiograph of same day. FINDINGS: Cardiovascular: No evidence of thoracic aortic dissection or aneurysm. Status post coronary bypass graft. Normal cardiac size. No pericardial effusion. Mediastinum/Nodes: Thyroid gland and esophagus are unremarkable. 3 cm precarinal lymph node is noted. 2.3 cm left paratracheal lymph node is noted. 14 mm subcarinal lymph node is noted. 14 mm prevascular lymph node is noted. 15 mm left hilar lymph node is noted. Lungs/Pleura: No pneumothorax or pleural effusion is noted. Right lung is clear. 3.4 x 3.2 cm left perihilar mass is noted concerning for malignancy, with resulting postobstructive atelectasis of the left upper lobe. Upper Abdomen: No acute abnormality. Musculoskeletal: No chest wall abnormality. No acute or significant osseous findings. IMPRESSION: Approximately 3.4 cm left perihilar mass is noted concerning for malignancy, which results in some degree of postobstructive atelectasis of the left upper lobe.  PET scan is recommended for further evaluation. Left paratracheal, precarinal, subcarinal, prevascular and left hilar adenopathy is noted concerning for metastatic disease. Status post coronary bypass graft. Electronically Signed   By: Marijo Conception M.D.   On: 10/09/2019 14:35   Nm Pet Image Initial (pi) Skull Base To Thigh  Result Date: 10/14/2019 CLINICAL DATA:  Initial treatment strategy for left upper lobe mass. EXAM: NUCLEAR MEDICINE PET SKULL BASE TO THIGH TECHNIQUE: 13.6 mCi F-18 FDG  was injected intravenously. Full-ring PET imaging was performed from the skull base to thigh after the radiotracer. CT data was obtained and used for attenuation correction and anatomic localization. Fasting blood glucose: 156 mg/dl COMPARISON:  CT chest 10/09/2019 FINDINGS: Mediastinal blood pool activity: SUV max 2.3 Liver activity: SUV max NA NECK: No significant abnormal hypermetabolic activity in this region. Incidental CT findings: none CHEST: Spiculated left upper lobe central mass has a maximum SUV of 10.1 and measures approximately 6.3 by 3.4 cm. This mass has spiculated margins postobstructive atelectasis. There is associated left supraclavicular, prevascular, right and left paratracheal, AP window, subcarinal, left hilar, and left infrahilar adenopathy. Index left supraclavicular node 1.7 cm in short axis on image 69/3, maximum SUV 7.6. Index right paratracheal nodal conglomerate 2.8 cm in short axis on image 93/3, maximum SUV 10.1. Subcarinal node 1.3 cm in short axis on image 107/3, maximum SUV 10.0. Incidental CT findings: Coronary, aortic arch, and branch vessel atherosclerotic vascular disease. Prior CABG. ABDOMEN/PELVIS: A partially exophytic lesion of the right kidney upper pole measuring 2.9 by 3.4 cm on image 179/3 has a suggestion of associated hypermetabolic activity which would be highly unusual for cyst, raising concern for a solid renal mass such as renal cell carcinoma. SUV in this lesion up to  3.5. Incidental CT findings: Aortoiliac atherosclerotic vascular disease. SKELETON: No significant abnormal hypermetabolic activity in this region. Incidental CT findings: none IMPRESSION: 1. The spiculated left upper lobe mass has a maximum SUV of 10.1 compatible with malignancy. There is associated left supraclavicular, ipsilateral and contralateral mediastinal, left hilar, left infrahilar, and subcarinal hypermetabolic adenopathy compatible with nodal spread. No findings of additional involvement of the neck, abdomen/pelvis, or skeleton. 2. A 3.4 cm partially exophytic lesion of the right kidney upper pole appears to have metabolic activity, making it concerning for a independent mass such as renal cell carcinoma. Options for workup may include renal ultrasound or renal protocol MRI with and without contrast. 3. Aortic Atherosclerosis (ICD10-I70.0).  Coronary atherosclerosis. Electronically Signed   By: Van Clines M.D.   On: 10/14/2019 14:56    No images are attached to the encounter.   CMP Latest Ref Rng & Units 10/09/2019  Glucose 70 - 99 mg/dL 144(H)  BUN 8 - 23 mg/dL 16  Creatinine 0.61 - 1.24 mg/dL 0.65  Sodium 135 - 145 mmol/L 137  Potassium 3.5 - 5.1 mmol/L 3.4(L)  Chloride 98 - 111 mmol/L 103  CO2 22 - 32 mmol/L 20(L)  Calcium 8.9 - 10.3 mg/dL 9.6  Total Protein 6.0 - 8.5 g/dL -  Total Bilirubin 0.0 - 1.2 mg/dL -  Alkaline Phos 39 - 117 IU/L -  AST 0 - 40 IU/L -  ALT 0 - 44 IU/L -   CBC Latest Ref Rng & Units 10/09/2019  WBC 4.0 - 10.5 K/uL 8.1  Hemoglobin 13.0 - 17.0 g/dL 15.7  Hematocrit 39.0 - 52.0 % 45.5  Platelets 150 - 400 K/uL 339     Observation/objective: Appears in no acute distress of a video visit today.  Breathing is nonlabored  Assessment and plan: Patient is a 68 year old male with following issues  1.  I have reviewed PET/CT scan images independently and discussed findings with the patient.  He was found to have a large left upper lobe lung mass  along with bilateral mediastinal adenopathy as well as Pepcid lateral supraclavicular adenopathy.  He likely has stage IIIc disease but final staging will be done after his pathology is back.  At  this time I am referring him to pulmonary for bronchoscopy and biopsy.  Discussed that for stage III lung cancer treatment is concurrent chemoradiation.  What type of chemotherapy he receives will depend on whether he has non-small cell or small cell lung cancer.  2.  Patient will go ahead with his MRI brain to complete staging work-up  3.  Patient also incidentally noted to have an exophytic 3.5 cm right renal mass which needs further evaluation by urology and I will refer him to urology for that.  Defer decision to get MRI renal mass protocol to urology  Follow-up instructions: I will tentatively see the patient back in 10 days time to discuss the results of pathology and further management  I discussed the assessment and treatment plan with the patient. The patient was provided an opportunity to ask questions and all were answered. The patient agreed with the plan and demonstrated an understanding of the instructions.   The patient was advised to call back or seek an in-person evaluation if the symptoms worsen or if the condition fails to improve as anticipated.   Visit Diagnosis: 1. Lung mass   2. Renal mass   3. Mediastinal adenopathy     [name], MD, MPH [facility] at [facility] [place] [phone] 10/17/2019 11:55 AM

## 2019-10-20 ENCOUNTER — Institutional Professional Consult (permissible substitution): Payer: Medicare Other | Admitting: Pulmonary Disease

## 2019-10-20 ENCOUNTER — Telehealth: Payer: Self-pay | Admitting: *Deleted

## 2019-10-20 ENCOUNTER — Other Ambulatory Visit: Payer: Self-pay | Admitting: *Deleted

## 2019-10-20 MED ORDER — HYDROCODONE-HOMATROPINE 5-1.5 MG/5ML PO SYRP: 5.0000 mL | ORAL_SOLUTION | Freq: Four times a day (QID) | ORAL | 0 refills | Status: DC | PRN

## 2019-10-20 NOTE — Telephone Encounter (Signed)
Can you send him a prescription for hycodan?

## 2019-10-20 NOTE — Telephone Encounter (Signed)
Pt is having significant coughing spells when lays flat at night. He is concerned that this may a problem when he goes for his MRI scan since he will have to lay flat for the exam. He was wondering if he could take anything to help with his cough so he can lay flat for the MRI.  Also, pt reports has noticed symptoms of shooting pain in his buttocks on both sides when he has a bowel movement. Also having numbness to his forehead. These were new symptoms that he just noticed this weekend and wanted you to be aware.

## 2019-10-20 NOTE — Telephone Encounter (Signed)
No problem. I sent the prescription to your in-basket to sign.

## 2019-10-21 ENCOUNTER — Other Ambulatory Visit: Payer: Self-pay

## 2019-10-21 ENCOUNTER — Ambulatory Visit (INDEPENDENT_AMBULATORY_CARE_PROVIDER_SITE_OTHER): Payer: Medicare Other | Admitting: Pulmonary Disease

## 2019-10-21 ENCOUNTER — Encounter: Payer: Self-pay | Admitting: Pulmonary Disease

## 2019-10-21 VITALS — BP 118/80 | HR 82 | Temp 97.2°F | Ht 71.0 in | Wt 247.8 lb

## 2019-10-21 DIAGNOSIS — R918 Other nonspecific abnormal finding of lung field: Secondary | ICD-10-CM

## 2019-10-21 DIAGNOSIS — R0602 Shortness of breath: Secondary | ICD-10-CM | POA: Diagnosis not present

## 2019-10-21 DIAGNOSIS — I480 Paroxysmal atrial fibrillation: Secondary | ICD-10-CM

## 2019-10-21 DIAGNOSIS — R590 Localized enlarged lymph nodes: Secondary | ICD-10-CM

## 2019-10-21 MED ORDER — BEVESPI AEROSPHERE 9-4.8 MCG/ACT IN AERO: 2.0000 | INHALATION_SPRAY | Freq: Two times a day (BID) | RESPIRATORY_TRACT | 0 refills | Status: AC

## 2019-10-21 MED ORDER — BEVESPI AEROSPHERE 9-4.8 MCG/ACT IN AERO: 2.0000 | INHALATION_SPRAY | Freq: Two times a day (BID) | RESPIRATORY_TRACT | 0 refills | Status: DC

## 2019-10-21 NOTE — Progress Notes (Signed)
Subjective:    Patient ID: Derrick Gallegos, male    DOB: 28-Dec-1950, 68 y.o.   MRN: [id number]  HPI Derrick Gallegos is a 68 year old former smoker (quit 2004) who presents for evaluation of a lung mass.  He is kindly referred by [name].  The patient states that around July 2020 he felt like he had "pneumonia" he noticed shortness of breath and cough.  He was evaluated by urgent cares and was treated twice with antibiotics without relief.  After this he decided to present to the emergency room on 15 October for developing some chest discomfort associated with the above symptoms.  The emergency room evaluation revealed that the patient had a lung mass.  A CT scan of the chest was performed that showed a 3.4 cm left perihilar mass with associated left paratracheal, precarinal, subcarinal and prevascular left hilar adenopathy.  I have reviewed these films independently and reviewed the films with the patient and his son who was present with him today.  Patient also reports weight loss of 20 pounds over the last month despite being "ravenous" all the time and eating quite well.  He is currently on no inhalers and as noted above does notice shortness of breath.  He has had a dry nonproductive cough.  No hemoptysis.  Nothing makes his symptoms any better.  He has had a vague chest pain as noted above since July and runs across his chest.  I suspect that this is related to bulky mediastinal adenopathy.  The pain became concerning to him ergo the emergency room visit of 15 October.  No cardiac etiology for the pain could be noted.  Past medical history, surgical history and family history have been reviewed.  Of significance he has had issues with coronary artery disease and is status post stent placement in 2019.  He has also had issues with A. fib in the past but none recently.  He is on Xarelto.                                                                                                                                                                                                     Review of Systems  Constitutional: Positive for unexpected weight change (Weight loss as above).  HENT: Negative.   Eyes: Negative.   Respiratory: Positive for cough and shortness of breath.   Cardiovascular: Positive for chest pain (Chest wall pain).  Gastrointestinal: Negative.   Endocrine: Negative.   Genitourinary: Negative.   Musculoskeletal: Positive for arthralgias and joint swelling.  Skin: Negative.   Allergic/Immunologic: Negative.  Neurological: Negative.   Hematological: Negative.   Psychiatric/Behavioral: Negative.   All other systems reviewed and are negative.      Objective:   Physical Exam Vitals signs and nursing note reviewed.  Constitutional:      General: He is awake.     Appearance: Normal appearance. He is overweight. He is not toxic-appearing.  HENT:     Head: Normocephalic and atraumatic.     Right Ear: External ear normal.     Left Ear: External ear normal.     Nose:     Comments: Nose/mouth/throat not examined due to masking requirements for COVID 19. Eyes:     General: No scleral icterus.    Conjunctiva/sclera: Conjunctivae normal.     Pupils: Pupils are equal, round, and reactive to light.  Neck:     Musculoskeletal: Neck supple.     Thyroid: No thyromegaly.     Vascular: No JVD.     Trachea: Trachea and phonation normal.     Comments: Supraclavicular node noted on left on CT scan not easily palpable. Cardiovascular:     Rate and Rhythm: Normal rate and regular rhythm.     Pulses: Normal pulses.     Heart sounds: Normal heart sounds.  Pulmonary:     Effort: Pulmonary effort is normal. No respiratory distress.     Comments: Coarse breath sounds Abdominal:     General: There is no distension.  Musculoskeletal: Normal range of motion.     Right lower leg: No edema.     Left lower leg: No edema.  Lymphadenopathy:     Cervical: No cervical adenopathy.   Neurological:     General: No focal deficit present.     Mental Status: He is alert and oriented to person, place, and time.  Psychiatric:        Mood and Affect: Mood normal.        Behavior: Behavior normal. Behavior is cooperative.     I have independently reviewed the patient's imaging.  CT scan of the chest performed on 15 October as well as PET/CT performed on 20 October.  The films have also been reviewed with the patient and findings explained.     Assessment & Plan:   1.  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  The procedure was explained to the patient.  Benefits, limitations and potential complications of the procedure were discussed with the patient/family  including, but not limited to bleeding, hemoptysis, respiratory failure requiring intubation and/or prolongued mechanical ventilation, infection, pneumothorax (collapse of lung) requiring chest tube placement, stroke from air embolism or even death.  Patient agrees to proceed.  Procedure has been tentatively scheduled for Wednesday 4 November at 1300 hrs.  Patient is aware that he will have to discontinue Xarelto at  5 days prior to the procedure  (Saturday 10/31).  2.  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  This will be staged during the bronchoscopy procedure above.  This will be done with endobronchial ultrasound (EBUS).  Consent disclosure as above.  3.  Shortness of breath/dyspnea/cough: May be related to underlying COPD as well.  Trial of Bevespi 2 inhalations twice a day to see if this helps with the shortness of breath and any cough that he may be experiencing.  He was also instructed to use Mucinex DM (extra strength) over-the-counter twice a day as needed for cough.  4.  History of paroxysmal atrial flutter: He has been on  Xarelto.  He understands the need to withhold Xarelto for 5 days prior to the procedure.  He may continue taking low-dose  aspirin as this will not interfere with the procedure.  Thank you for allowing me to participate in this patient's care.  Please see orders for details.   This chart was dictated using voice recognition software/Dragon.  Despite best efforts to proofread, errors can occur which can change the meaning.  Any change was purely unintentional.

## 2019-10-21 NOTE — Patient Instructions (Addendum)
1.  We are arranging for your biopsy.  2.  We will give you a trial of Bevespi 2 puffs twice a day to see if this helps some with your cough and shortness of breath.  This is an inhaler.  3.  For the cough he can also use Mucinex DM 1,200 (extra strength tablet) 1 twice a day.  As needed for cough.  4.  We will schedule you for a follow-up 1 to 2 weeks after the procedure.

## 2019-10-21 NOTE — H&P (View-Only) (Signed)
Subjective:    Patient ID: Derrick Gallegos, male    DOB: 02/20/1951, 68 y.o.   MRN: [id number]  HPI Derrick Gallegos is a 68 year old former smoker (quit 2004) who presents for evaluation of a lung mass.  He is kindly referred by [name].  The patient states that around July 2020 he felt like he had "pneumonia" he noticed shortness of breath and cough.  He was evaluated by urgent cares and was treated twice with antibiotics without relief.  After this he decided to present to the emergency room on 15 October for developing some chest discomfort associated with the above symptoms.  The emergency room evaluation revealed that the patient had a lung mass.  A CT scan of the chest was performed that showed a 3.4 cm left perihilar mass with associated left paratracheal, precarinal, subcarinal and prevascular left hilar adenopathy.  I have reviewed these films independently and reviewed the films with the patient and his son who was present with him today.  Patient also reports weight loss of 20 pounds over the last month despite being "ravenous" all the time and eating quite well.  He is currently on no inhalers and as noted above does notice shortness of breath.  He has had a dry nonproductive cough.  No hemoptysis.  Nothing makes his symptoms any better.  He has had a vague chest pain as noted above since July and runs across his chest.  I suspect that this is related to bulky mediastinal adenopathy.  The pain became concerning to him ergo the emergency room visit of 15 October.  No cardiac etiology for the pain could be noted.  Past medical history, surgical history and family history have been reviewed.  Of significance he has had issues with coronary artery disease and is status post stent placement in 2019.  He has also had issues with A. fib in the past but none recently.  He is on Xarelto.                                                                                                                                                                                                     Review of Systems  Constitutional: Positive for unexpected weight change (Weight loss as above).  HENT: Negative.   Eyes: Negative.   Respiratory: Positive for cough and shortness of breath.   Cardiovascular: Positive for chest pain (Chest wall pain).  Gastrointestinal: Negative.   Endocrine: Negative.   Genitourinary: Negative.   Musculoskeletal: Positive for arthralgias and joint swelling.  Skin: Negative.   Allergic/Immunologic: Negative.  Neurological: Negative.   Hematological: Negative.   Psychiatric/Behavioral: Negative.   All other systems reviewed and are negative.      Objective:   Physical Exam Vitals signs and nursing note reviewed.  Constitutional:      General: He is awake.     Appearance: Normal appearance. He is overweight. He is not toxic-appearing.  HENT:     Head: Normocephalic and atraumatic.     Right Ear: External ear normal.     Left Ear: External ear normal.     Nose:     Comments: Nose/mouth/throat not examined due to masking requirements for COVID 19. Eyes:     General: No scleral icterus.    Conjunctiva/sclera: Conjunctivae normal.     Pupils: Pupils are equal, round, and reactive to light.  Neck:     Musculoskeletal: Neck supple.     Thyroid: No thyromegaly.     Vascular: No JVD.     Trachea: Trachea and phonation normal.     Comments: Supraclavicular node noted on left on CT scan not easily palpable. Cardiovascular:     Rate and Rhythm: Normal rate and regular rhythm.     Pulses: Normal pulses.     Heart sounds: Normal heart sounds.  Pulmonary:     Effort: Pulmonary effort is normal. No respiratory distress.     Comments: Coarse breath sounds Abdominal:     General: There is no distension.  Musculoskeletal: Normal range of motion.     Right lower leg: No edema.     Left lower leg: No edema.  Lymphadenopathy:     Cervical: No cervical adenopathy.   Neurological:     General: No focal deficit present.     Mental Status: He is alert and oriented to person, place, and time.  Psychiatric:        Mood and Affect: Mood normal.        Behavior: Behavior normal. Behavior is cooperative.     I have independently reviewed the patient's imaging.  CT scan of the chest performed on 15 October as well as PET/CT performed on 20 October.  The films have also been reviewed with the patient and findings explained.     Assessment & Plan:   1.  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  The procedure was explained to the patient.  Benefits, limitations and potential complications of the procedure were discussed with the patient/family  including, but not limited to bleeding, hemoptysis, respiratory failure requiring intubation and/or prolongued mechanical ventilation, infection, pneumothorax (collapse of lung) requiring chest tube placement, stroke from air embolism or even death.  Patient agrees to proceed.  Procedure has been tentatively scheduled for Wednesday 4 November at 1300 hrs.  Patient is aware that he will have to discontinue Xarelto at  5 days prior to the procedure  (Saturday 10/31).  2.  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  This will be staged during the bronchoscopy procedure above.  This will be done with endobronchial ultrasound (EBUS).  Consent disclosure as above.  3.  Shortness of breath/dyspnea/cough: May be related to underlying COPD as well.  Trial of Bevespi 2 inhalations twice a day to see if this helps with the shortness of breath and any cough that he may be experiencing.  He was also instructed to use Mucinex DM (extra strength) over-the-counter twice a day as needed for cough.  4.  History of paroxysmal atrial flutter: He has been on  Xarelto.  He understands the need to withhold Xarelto for 5 days prior to the procedure.  He may continue taking low-dose  aspirin as this will not interfere with the procedure.  Thank you for allowing me to participate in this patient's care.  Please see orders for details.   This chart was dictated using voice recognition software/Dragon.  Despite best efforts to proofread, errors can occur which can change the meaning.  Any change was purely unintentional.

## 2019-10-22 ENCOUNTER — Telehealth: Payer: Self-pay

## 2019-10-22 ENCOUNTER — Ambulatory Visit
Admission: RE | Admit: 2019-10-22 | Discharge: 2019-10-22 | Disposition: A | Discharge status: Home / Self Care | Payer: Medicare Other | Source: Ambulatory Visit | Attending: Oncology | Admitting: Oncology

## 2019-10-22 DIAGNOSIS — R918 Other nonspecific abnormal finding of lung field: Secondary | ICD-10-CM | POA: Diagnosis present

## 2019-10-22 MED ORDER — GADOBUTROL 1 MMOL/ML IV SOLN
10.0000 mL | Freq: Once | INTRAVENOUS | Status: AC | PRN
  Administered 2019-10-22: 10 mL via INTRAVENOUS

## 2019-10-22 NOTE — Telephone Encounter (Signed)
Call made to patient, made aware his biopsy is scheduled for 10/24/2019. I made him aware we would be reaching out to him schedule his Covid testing as well pre-admit testing. Also reminded that his last dose of Xarelto should be taken Friday 10/30 and not to resume until instructed by Dr. Darnell Level. Voiced understanding.   Patient Derrick Gallegos 11.4.2020 at 1pm  Dx: Lung Mass and Mediastinal Adenopathy  CPT code: [phone], [phone], [phone]

## 2019-10-22 NOTE — Telephone Encounter (Signed)
No PA required with [facility] and [facility] Supplement plan. Rhonda J Cobb

## 2019-10-23 NOTE — Telephone Encounter (Signed)
Pre admit testing has been scheduled for 10/24/2019 at 11:00 with covid test to follow.  Pt is aware of date/time of appt and voiced his understanding.  Nothing further is needed.

## 2019-10-24 ENCOUNTER — Other Ambulatory Visit: Payer: Self-pay

## 2019-10-24 ENCOUNTER — Inpatient Hospital Stay: Payer: Medicare Other | Admitting: Oncology

## 2019-10-24 ENCOUNTER — Other Ambulatory Visit
Admission: RE | Admit: 2019-10-24 | Discharge: 2019-10-24 | Disposition: A | Discharge status: Home / Self Care | Payer: Medicare Other | Source: Ambulatory Visit | Attending: Pulmonary Disease | Admitting: Pulmonary Disease

## 2019-10-24 DIAGNOSIS — Z20828 Contact with and (suspected) exposure to other viral communicable diseases: Secondary | ICD-10-CM | POA: Insufficient documentation

## 2019-10-24 DIAGNOSIS — Z01812 Encounter for preprocedural laboratory examination: Secondary | ICD-10-CM | POA: Insufficient documentation

## 2019-10-24 HISTORY — DX: Other complications of anesthesia, initial encounter: T88.59XA

## 2019-10-24 HISTORY — DX: Cardiac arrhythmia, unspecified: I49.9

## 2019-10-24 HISTORY — DX: Cardiac murmur, unspecified: R01.1

## 2019-10-24 HISTORY — DX: Dyspnea, unspecified: R06.00

## 2019-10-24 HISTORY — DX: Gastro-esophageal reflux disease without esophagitis: K21.9

## 2019-10-24 HISTORY — DX: Other nonspecific abnormal finding of lung field: R91.8

## 2019-10-24 HISTORY — DX: Cough, unspecified: R05.9

## 2019-10-24 LAB — SARS CORONAVIRUS 2 (TAT 6-24 HRS): SARS Coronavirus 2: NEGATIVE

## 2019-10-24 NOTE — Patient Instructions (Addendum)
Your procedure is scheduled on: 10/29/2019 Wed Report to Same Day Surgery 2nd floor medical mall [facility] Entrance-take elevator on left to 2nd floor.  Check in with surgery information desk.) To find out your arrival time please call [phone] between 1PM - 3PM on 10/28/2019 Tues  Remember: Instructions that are not followed completely may result in serious medical risk, up to and including death, or upon the discretion of your surgeon and anesthesiologist your surgery may need to be rescheduled.    _x___ 1. Do not eat food after midnight the night before your procedure. You may drink clear liquids up to 2 hours before you are scheduled to arrive at the hospital for your procedure.  Do not drink clear liquids within 2 hours of your scheduled arrival to the hospital.  Clear liquids include  --Water or Apple juice without pulp  --Clear carbohydrate beverage such as ClearFast or Gatorade  --Black Coffee or Clear Tea (No milk, no creamers, do not add anything to                  the coffee or Tea Type 1 and type 2 diabetics should only drink water.   ____Ensure clear carbohydrate drink on the way to the hospital for bariatric patients  ____Ensure clear carbohydrate drink 3 hours before surgery.   No gum chewing or hard candies.     __x__ 2. No Alcohol for 24 hours before or after surgery.   __x__3. No Smoking or e-cigarettes for 24 prior to surgery.  Do not use any chewable tobacco products for at least 6 hour prior to surgery   ____  4. Bring all medications with you on the day of surgery if instructed.    __x__ 5. Notify your doctor if there is any change in your medical condition     (cold, fever, infections).    x___6. On the morning of surgery brush your teeth with toothpaste and water.  You may rinse your mouth with mouth wash if you wish.  Do not swallow any toothpaste or mouthwash.   Do not wear jewelry, make-up, hairpins, clips or nail polish.  Do not wear lotions,  powders, or perfumes. You may wear deodorant.  Do not shave 48 hours prior to surgery. Men may shave face and neck.  Do not bring valuables to the hospital.    [facility] is not responsible for any belongings or valuables.               Contacts, dentures or bridgework may not be worn into surgery.  Leave your suitcase in the car. After surgery it may be brought to your room.  For patients admitted to the hospital, discharge time is determined by your                       treatment team.  _  Patients discharged the day of surgery will not be allowed to drive home.  You will need someone to drive you home and stay with you the night of your procedure.    Please read over the following fact sheets that you were given:   [facility] Preparing for Surgery and or MRSA Information   _x___ Take anti-hypertensive listed below, cardiac, seizure, asthma,     anti-reflux and psychiatric medicines. These include:  1. Glycopyrrolate-Formoterol (BEVESPI AEROSPHERE) 9-4.8 MCG/ACT AERO  2.LORazepam (ATIVAN) 0.5 MG tablet if needed  3.metoprolol tartrate (LOPRESSOR) 25 MG tablet  4.omeprazole (PRILOSEC) 20 MG  capsule  5.  6.  ____Fleets enema or Magnesium Citrate as directed.   ____ Use CHG Soap or sage wipes as directed on instruction sheet   ____ Use inhalers on the day of surgery and bring to hospital day of surgery  ____ Stop Metformin and Janumet 2 days prior to surgery.    ____ Take 1/2 of usual insulin dose the night before surgery and none on the morning     surgery.   _x___ Follow recommendations from Cardiologist, Pulmonologist or PCP regarding          stopping Aspirin, Coumadin, Plavix ,Eliquis, Effient, or Pradaxa, and Pletal.  X____Stop Anti-inflammatories such as Advil, Aleve, Ibuprofen, Motrin, Naproxen, Naprosyn, Goodies powders or aspirin products. OK to take Tylenol and                          Celebrex.   _x___ Stop supplements until after surgery.  But may continue Vitamin  D, Vitamin B,       and multivitamin.  Stop fish oil today.   ____ Bring C-Pap to the hospital.

## 2019-10-27 ENCOUNTER — Ambulatory Visit: Payer: Medicare Other | Admitting: Oncology

## 2019-10-27 ENCOUNTER — Other Ambulatory Visit: Payer: Self-pay | Admitting: *Deleted

## 2019-10-28 MED ORDER — LORAZEPAM 0.5 MG PO TABS: 0.5000 mg | ORAL_TABLET | Freq: Four times a day (QID) | ORAL | 0 refills | Status: DC | PRN

## 2019-10-29 ENCOUNTER — Ambulatory Visit: Payer: Medicare Other | Admitting: Anesthesiology

## 2019-10-29 ENCOUNTER — Encounter: Payer: Self-pay | Admitting: Anesthesiology

## 2019-10-29 ENCOUNTER — Ambulatory Visit
Admission: RE | Admit: 2019-10-29 | Discharge: 2019-10-29 | Disposition: A | Discharge status: Home / Self Care | Payer: Medicare Other | Attending: Pulmonary Disease | Admitting: Pulmonary Disease

## 2019-10-29 ENCOUNTER — Other Ambulatory Visit: Payer: Self-pay

## 2019-10-29 ENCOUNTER — Ambulatory Visit
Admission: RE | Disposition: A | Discharge status: Home / Self Care | Payer: Self-pay | Source: Home / Self Care | Attending: Pulmonary Disease

## 2019-10-29 DIAGNOSIS — Z951 Presence of aortocoronary bypass graft: Secondary | ICD-10-CM | POA: Insufficient documentation

## 2019-10-29 DIAGNOSIS — I252 Old myocardial infarction: Secondary | ICD-10-CM | POA: Insufficient documentation

## 2019-10-29 DIAGNOSIS — E119 Type 2 diabetes mellitus without complications: Secondary | ICD-10-CM | POA: Insufficient documentation

## 2019-10-29 DIAGNOSIS — R590 Localized enlarged lymph nodes: Secondary | ICD-10-CM | POA: Diagnosis not present

## 2019-10-29 DIAGNOSIS — I4891 Unspecified atrial fibrillation: Secondary | ICD-10-CM | POA: Diagnosis not present

## 2019-10-29 DIAGNOSIS — Z955 Presence of coronary angioplasty implant and graft: Secondary | ICD-10-CM | POA: Diagnosis not present

## 2019-10-29 DIAGNOSIS — Z7901 Long term (current) use of anticoagulants: Secondary | ICD-10-CM | POA: Insufficient documentation

## 2019-10-29 DIAGNOSIS — C771 Secondary and unspecified malignant neoplasm of intrathoracic lymph nodes: Secondary | ICD-10-CM | POA: Diagnosis not present

## 2019-10-29 DIAGNOSIS — Z87891 Personal history of nicotine dependence: Secondary | ICD-10-CM | POA: Insufficient documentation

## 2019-10-29 DIAGNOSIS — I2510 Atherosclerotic heart disease of native coronary artery without angina pectoris: Secondary | ICD-10-CM | POA: Diagnosis not present

## 2019-10-29 DIAGNOSIS — C3412 Malignant neoplasm of upper lobe, left bronchus or lung: Secondary | ICD-10-CM | POA: Diagnosis not present

## 2019-10-29 DIAGNOSIS — I10 Essential (primary) hypertension: Secondary | ICD-10-CM | POA: Insufficient documentation

## 2019-10-29 DIAGNOSIS — R918 Other nonspecific abnormal finding of lung field: Secondary | ICD-10-CM

## 2019-10-29 HISTORY — PX: VIDEO BRONCHOSCOPY: SHX5072

## 2019-10-29 LAB — GLUCOSE, CAPILLARY
Glucose-Capillary: 138 mg/dL — ABNORMAL HIGH (ref 70–99)
Glucose-Capillary: 170 mg/dL — ABNORMAL HIGH (ref 70–99)

## 2019-10-29 SURGERY — VIDEO BRONCHOSCOPY WITHOUT FLUORO
Anesthesia: General

## 2019-10-29 MED ORDER — PROPOFOL 10 MG/ML IV BOLUS
INTRAVENOUS | Status: AC
  Filled 2019-10-29: qty 20

## 2019-10-29 MED ORDER — ONDANSETRON HCL 4 MG/2ML IJ SOLN
INTRAMUSCULAR | Status: DC | PRN
  Administered 2019-10-29: 4 mg via INTRAVENOUS

## 2019-10-29 MED ORDER — SUCCINYLCHOLINE CHLORIDE 20 MG/ML IJ SOLN
INTRAMUSCULAR | Status: DC | PRN
  Administered 2019-10-29: 120 mg via INTRAVENOUS

## 2019-10-29 MED ORDER — LIDOCAINE HCL (CARDIAC) PF 100 MG/5ML IV SOSY
PREFILLED_SYRINGE | INTRAVENOUS | Status: DC | PRN
  Administered 2019-10-29: 60 mg via INTRAVENOUS

## 2019-10-29 MED ORDER — ACETAMINOPHEN 10 MG/ML IV SOLN
1000.0000 mg | Freq: Once | INTRAVENOUS | Status: AC
  Administered 2019-10-29: 1000 mg via INTRAVENOUS

## 2019-10-29 MED ORDER — SUGAMMADEX SODIUM 200 MG/2ML IV SOLN
INTRAVENOUS | Status: DC | PRN
  Administered 2019-10-29: 200 mg via INTRAVENOUS

## 2019-10-29 MED ORDER — PHENYLEPHRINE HCL (PRESSORS) 10 MG/ML IV SOLN
INTRAVENOUS | Status: DC | PRN
  Administered 2019-10-29: 150 ug via INTRAVENOUS
  Administered 2019-10-29: 100 ug via INTRAVENOUS
  Administered 2019-10-29: 150 ug via INTRAVENOUS
  Administered 2019-10-29: 100 ug via INTRAVENOUS

## 2019-10-29 MED ORDER — FENTANYL CITRATE (PF) 100 MCG/2ML IJ SOLN
INTRAMUSCULAR | Status: DC | PRN
  Administered 2019-10-29: 50 ug via INTRAVENOUS

## 2019-10-29 MED ORDER — DEXMEDETOMIDINE HCL 200 MCG/2ML IV SOLN
INTRAVENOUS | Status: DC | PRN
  Administered 2019-10-29 (×2): 4 ug via INTRAVENOUS

## 2019-10-29 MED ORDER — MIDAZOLAM HCL 2 MG/2ML IJ SOLN
INTRAMUSCULAR | Status: AC
  Filled 2019-10-29: qty 2

## 2019-10-29 MED ORDER — SODIUM CHLORIDE 0.9 % IV SOLN
INTRAVENOUS | Status: DC
  Administered 2019-10-29 (×2): via INTRAVENOUS

## 2019-10-29 MED ORDER — MIDAZOLAM HCL 5 MG/5ML IJ SOLN
INTRAMUSCULAR | Status: DC | PRN
  Administered 2019-10-29: 2 mg via INTRAVENOUS

## 2019-10-29 MED ORDER — DEXAMETHASONE SODIUM PHOSPHATE 10 MG/ML IJ SOLN
INTRAMUSCULAR | Status: AC
  Filled 2019-10-29: qty 1

## 2019-10-29 MED ORDER — ACETAMINOPHEN 10 MG/ML IV SOLN
INTRAVENOUS | Status: AC
  Filled 2019-10-29: qty 100

## 2019-10-29 MED ORDER — BUTAMBEN-TETRACAINE-BENZOCAINE 2-2-14 % EX AERO
1.0000 | INHALATION_SPRAY | Freq: Once | CUTANEOUS | Status: DC
  Filled 2019-10-29: qty 20

## 2019-10-29 MED ORDER — ROCURONIUM BROMIDE 50 MG/5ML IV SOLN
INTRAVENOUS | Status: AC
  Filled 2019-10-29: qty 1

## 2019-10-29 MED ORDER — PROPOFOL 10 MG/ML IV BOLUS
INTRAVENOUS | Status: DC | PRN
  Administered 2019-10-29: 40 mg via INTRAVENOUS
  Administered 2019-10-29: 150 mg via INTRAVENOUS

## 2019-10-29 MED ORDER — ROCURONIUM BROMIDE 100 MG/10ML IV SOLN
INTRAVENOUS | Status: DC | PRN
  Administered 2019-10-29: 15 mg via INTRAVENOUS
  Administered 2019-10-29: 5 mg via INTRAVENOUS

## 2019-10-29 MED ORDER — FENTANYL CITRATE (PF) 100 MCG/2ML IJ SOLN
INTRAMUSCULAR | Status: AC
  Filled 2019-10-29: qty 2

## 2019-10-29 MED ORDER — FENTANYL CITRATE (PF) 100 MCG/2ML IJ SOLN: 25.0000 ug | INTRAMUSCULAR | Status: DC | PRN

## 2019-10-29 MED ORDER — MEPERIDINE HCL 25 MG/ML IJ SOLN
25.0000 mg | Freq: Once | INTRAMUSCULAR | Status: AC
  Administered 2019-10-29: 25 mg via INTRAVENOUS

## 2019-10-29 MED ORDER — DEXAMETHASONE SODIUM PHOSPHATE 10 MG/ML IJ SOLN
INTRAMUSCULAR | Status: DC | PRN
  Administered 2019-10-29: 8 mg via INTRAVENOUS

## 2019-10-29 MED ORDER — ONDANSETRON HCL 4 MG/2ML IJ SOLN
INTRAMUSCULAR | Status: AC
  Filled 2019-10-29: qty 2

## 2019-10-29 MED ORDER — ONDANSETRON HCL 4 MG/2ML IJ SOLN: 4.0000 mg | Freq: Once | INTRAMUSCULAR | Status: DC | PRN

## 2019-10-29 MED ORDER — SUCCINYLCHOLINE CHLORIDE 20 MG/ML IJ SOLN
INTRAMUSCULAR | Status: AC
  Filled 2019-10-29: qty 1

## 2019-10-29 NOTE — Transfer of Care (Signed)
Immediate Anesthesia Transfer of Care Note  Patient: Derrick Gallegos  Procedure(s) Performed: VIDEO BRONCHOSCOPY WITH ENDOBRONCHICAL ULTRASOUND (N/A )  Patient Location: PACU  Anesthesia Type:General  Level of Consciousness: sedated  Airway & Oxygen Therapy: Patient Spontanous Breathing and Patient connected to face mask oxygen  Post-op Assessment: Report given to RN and Post -op Vital signs reviewed and stable  Post vital signs: Reviewed and stable  Last Vitals:  Vitals Value Taken Time  BP 86/60 10/29/19 1425  Temp 35.8 C 10/29/19 1422  Pulse 71 10/29/19 1427  Resp 17 10/29/19 1427  SpO2 100 % 10/29/19 1427  Vitals shown include unvalidated device data.  Last Pain:  Vitals:   10/29/19 1422  TempSrc:   PainSc: Asleep         Complications: No apparent anesthesia complications

## 2019-10-29 NOTE — Discharge Instructions (Signed)
° ° ° °  Flexible Bronchoscopy, Care After This sheet gives you information about how to care for yourself after your test. Your doctor may also give you more specific instructions. If you have problems or questions, contact your doctor. Follow these instructions at home: Eating and drinking  Do not eat or drink anything (not even water) for 2 hours after your test, or until your numbing medicine (local anesthetic) wears off.  When your numbness is gone and your cough and gag reflexes have come back, you may: ? Eat only soft foods. ? Slowly drink liquids.  The day after the test, go back to your normal diet. Driving  Do not drive for 24 hours if you were given a medicine to help you relax (sedative).  Do not drive or use heavy machinery while taking prescription pain medicine. General instructions   Take over-the-counter and prescription medicines only as told by your doctor.  Return to your normal activities as told. Ask what activities are safe for you.  Do not use any products that have nicotine or tobacco in them. This includes cigarettes and e-cigarettes. If you need help quitting, ask your doctor.  Keep all follow-up visits as told by your doctor. This is important. It is very important if you had a tissue sample (biopsy) taken. Get help right away if:  You have shortness of breath that gets worse.  You get light-headed.  You feel like you are going to pass out (faint).  You have chest pain.  You cough up: ? More than a little blood. ? More blood than before. Summary  Do not eat or drink anything (not even water) for 2 hours after your test, or until your numbing medicine wears off.  Do not use cigarettes. Do not use e-cigarettes.  Get help right away if you have chest pain. This information is not intended to replace advice given to you by your health care provider. Make sure you discuss any questions you have with your health care provider. Document Released:  10/08/2009 Document Revised: 11/23/2017 Document Reviewed: 12/29/2016 Elsevier Patient Education  2020 [employer]. Start your Xarelto on 10/30/2019

## 2019-10-29 NOTE — Anesthesia Procedure Notes (Signed)
Procedure Name: Intubation Date/Time: 10/29/2019 1:16 PM Performed by: Dionne Bucy, CRNA Pre-anesthesia Checklist: Patient identified, Patient being monitored, Timeout performed, Emergency Drugs available and Suction available Patient Re-evaluated:Patient Re-evaluated prior to induction Oxygen Delivery Method: Circle system utilized Preoxygenation: Pre-oxygenation with 100% oxygen Induction Type: IV induction Ventilation: Mask ventilation without difficulty Laryngoscope Size: 4 and McGraph Grade View: Grade I Tube type: Oral Tube size: 8.5 mm Number of attempts: 1 Airway Equipment and Method: Stylet Placement Confirmation: ETT inserted through vocal cords under direct vision,  positive ETCO2 and breath sounds checked- equal and bilateral Secured at: 23 cm Tube secured with: Tape Dental Injury: Teeth and Oropharynx as per pre-operative assessment

## 2019-10-29 NOTE — Anesthesia Post-op Follow-up Note (Signed)
Anesthesia QCDR form completed.        

## 2019-10-29 NOTE — Anesthesia Preprocedure Evaluation (Signed)
Anesthesia Evaluation  Patient identified by MRN, date of birth, ID band Patient awake    Reviewed: Allergy & Precautions, H&P , NPO status , Patient's Chart, lab work & pertinent test results, reviewed documented beta blocker date and time   History of Anesthesia Complications (+) AWARENESS UNDER ANESTHESIA and history of anesthetic complications  Airway Mallampati: III  TM Distance: >3 FB Neck ROM: full    Dental  (+) Missing, Dental Advidsory Given, Chipped, Teeth Intact   Pulmonary shortness of breath and with exertion, former smoker,           Cardiovascular Exercise Tolerance: Good hypertension, Pt. on medications and Pt. on home beta blockers (-) angina+ CAD, + Past MI and + CABG  (-) Cardiac Stents + dysrhythmias Atrial Fibrillation (-) Valvular Problems/Murmurs     Neuro/Psych PSYCHIATRIC DISORDERS Anxiety Depression negative neurological ROS     GI/Hepatic Neg liver ROS, GERD  ,  Endo/Other  diabetes  Renal/GU negative Renal ROS  negative genitourinary   Musculoskeletal  (+) Arthritis , Osteoarthritis,    Abdominal   Peds  Hematology negative hematology ROS (+)   Anesthesia Other Findings Past Medical History: No date: Anxiety No date: Arthritis No date: Coronary artery disease     Comment:  a. s/p 3-v cabg in 2004 at [place] (LIMA and radial arery               used - no further details in [place]) No date: Depression No date: Diabetes mellitus with complication ([place]) No date: Essential hypertension No date: Family history of premature CAD     Comment:  a. father passed away from an MI at 44 No date: HLD (hyperlipidemia)     Comment:  a. statin intolerant  No date: Obesity   Reproductive/Obstetrics negative OB ROS                             Anesthesia Physical  Anesthesia Plan  ASA: III  Anesthesia Plan: General   Post-op Pain Management:    Induction:  Intravenous  PONV Risk Score and Plan: 2  Airway Management Planned: Oral ETT  Additional Equipment:   Intra-op Plan:   Post-operative Plan: Extubation in OR  Informed Consent: I have reviewed the patients History and Physical, chart, labs and discussed the procedure including the risks, benefits and alternatives for the proposed anesthesia with the patient or authorized representative who has indicated his/her understanding and acceptance.     Dental Advisory Given  Plan Discussed with: Anesthesiologist, CRNA and Surgeon  Anesthesia Plan Comments:         Anesthesia Quick Evaluation

## 2019-10-29 NOTE — Op Note (Signed)
PROCEDURES:  1.  BRONCHOSCOPY with endobronchial biopsies and brushings 2.  ENDOBRONCHIAL ULTRASOUND with TBNA   PROCEDURE DATE: 10/29/2019  TIME: 13:23 hrs. NAME:  Derrick Gallegos  DOB:[birth year]  MRN: [id number] LOC:  ARPO/None    HOSP DAY: @LENGTHOFSTAYDAYS @ CODE STATUS: Full Code Status History    Date Active Date Inactive Code Status Order ID Comments User Context   08/08/2018 1516 08/09/2018 1648 Full Code [id number]  Saundra Shelling, MD Inpatient   08/08/2018 1420 08/08/2018 1516 Full Code [id number]  Isaias Cowman, MD Inpatient   11/16/2017 1346 11/17/2017 1722 Full Code [id number]  Max Sane, MD Inpatient   Advance Care Planning Activity        Indications/Preliminary Diagnosis: 1.  LEFT lung mass 2.  Mediastinal adenopathy  Consent: (Place X beside choice/s below)  The benefits, risks and possible complications of the procedure were        explained to:  _X__ patient  ___ patient's family  ___ other:___________  who verbalized understanding and gave:  ___ verbal  ___ written  _X__ verbal and written  ___ telephone  ___ other:________ consent.      Unable to obtain consent; procedure performed on emergent basis.     Other:   Benefits, limitations and potential complications of the procedure were discussed with the patient/family  including, but not limited to bleeding, hemoptysis, respiratory failure requiring intubation and/or prolongued mechanical ventilation, infection, pneumothorax (collapse of lung) requiring chest tube placement, stroke from air embolism or even death.  All questions answered.  Patient understood the procedure is being done under general anesthesia. Patient agreed to proceed.  Operator: Renold Don, MD CRNA: Dionne Bucy, CRNA Assistant/scrub: Sullivan Lone, RRT   ANESTHESIA: General endotracheal, see anesthesia records.   Insertion Route (Place X beside choice below)   Nasal   Oral  X Endotracheal Tube   Tracheostomy    PROCEDURE  DETAILS: Patient was taken to Procedure Room 2 where appropriate timeout was taken with the staff.  Patient was inducted under general anesthesia by CRNA/anesthesiologist and intubated with an 8.5 ET tube..  Once the tube was secured and the patient was under adequate general anesthesia a Portex adapter was placed over the flange of the ET tube and the Olympus video therapeutic bronchoscope was advanced through the existing ET tube.  The visible portions of the trachea were normal.  Carina was full.  Examination of the right tracheobronchial tree showed no endobronchial lesions, no mucosal abnormalities and no inspissated secretions.  Scope was brought back to the left mainstem bronchus and semination of the airway was done.  Anatomic survey showed that the patient had mucosal abnormality on the LEFT upper lobe bronchus with necrosis and para less than debris noted.  No distinct endobronchial lesion most of the abnormality appeared to be submucosal with mucosal breakthrough.  The mucosa was very friable at this point.  Patient received blanching with 1:10,000 solution of epinephrine, 3 mls and at this point endobronchial brushings were done of this area x2.  The ROSE showed mostly reactive cells and few atypical cells.  At this point biopsies were done attempting to grasp as much of the abnormal mucosa as possible, ROSE findings were similar to those on the brushings.  A total of 8 biopsies were obtained.  Once this was completed the bronchoscope was retrieved and exchanged for an endobronchial ultrasound bronchoscope.  The EBUS scope was advanced through the existing Portex adapter into the ET tube.  Examination of the mediastinum showed adenopathy/mass  effect at station 11 L, this was sampled by TBNA x3 with Dr. Lacinda Axon 22-gauge procore EBUS needle, ROSE showed mostly reactive cells, atypicals and blood.  The next station chosen was station 7 as this had shown increased FDG avidity on PET/CT.  This was approached  from the LEFT.  A total of 8 passes were done.  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  Specimens were placed on CytoLyt.  At this point having collected the specimens necessary patient had 9 mL of lidocaine 1% instilled into the tracheobronchial tree via bronchial lavage.  The bronchoscope was retrieved and the procedure was terminated.  The patient was allowed to emerge from general anesthesia and was extubated without incident and the procedure room.  He was taken to the PACU in satisfactory condition.    SPECIMENS (Sites): (Place X beside choice below)  Specimens Description   No Specimens Obtained     Washings    Lavage   X Biopsies X 8 LUL endobronchial  X Fine Needle Aspirates X 2 @ 11 L, X 8 Station 7  X Brushings X 2, LUL   Sputum    FINDINGS: Left upper lobe circumferential mucosal abnormality with friability.  Mediastinal adenopathy confirmed with endobronchial ultrasound.  ESTIMATED BLOOD LOSS: < 2 ml COMPLICATIONS/RESOLUTION: No complications..  IMPRESSION:POST-PROCEDURE DX:   1.  LEFT upper lobe mass  2.  Mediastinal adenopathy   RECOMMENDATION/PLAN:   1.  Follow-up pathology reports.  2. Discussed with patient's oncologist.   Renold Don, MD [place] [facility] Advanced Bronchoscopy

## 2019-10-29 NOTE — Interval H&P Note (Signed)
History and Physical Interval Note:  10/29/2019 1:03 PM  Derrick Gallegos  has presented today for surgery, with the diagnosis of LUNG MASS MEDIASTINAL ADENOPATHY.  The various methods of treatment have been discussed with the patient and family. After consideration of risks, benefits and other options for treatment, the patient has consented to  Procedure(s): VIDEO BRONCHOSCOPY WITH ENDOBRONCHICAL ULTRASOUND (N/A) as a surgical intervention.  The patient's history has been reviewed, patient examined, no change in status, stable for surgery.  I have reviewed the patient's chart and labs.  Questions were answered to the patient's satisfaction.     Renold Don, MD [place] [facility]

## 2019-10-30 ENCOUNTER — Encounter: Payer: Self-pay | Admitting: Pulmonary Disease

## 2019-10-30 LAB — CYTOLOGY - NON PAP

## 2019-10-30 LAB — SURGICAL PATHOLOGY

## 2019-10-30 NOTE — Anesthesia Postprocedure Evaluation (Signed)
Anesthesia Post Note  Patient: Derrick Gallegos  Procedure(s) Performed: VIDEO BRONCHOSCOPY WITH ENDOBRONCHICAL ULTRASOUND (N/A )  Patient location during evaluation: PACU Anesthesia Type: General Level of consciousness: awake and alert Pain management: pain level controlled Vital Signs Assessment: post-procedure vital signs reviewed and stable Respiratory status: spontaneous breathing, nonlabored ventilation and respiratory function stable Cardiovascular status: blood pressure returned to baseline and stable Postop Assessment: no apparent nausea or vomiting Anesthetic complications: no     Last Vitals:  Vitals:   10/29/19 1522 10/29/19 1537  BP: 109/77 114/74  Pulse: 62 62  Resp: 17 18  Temp:  (!) 36.3 C  SpO2: 99% 96%    Last Pain:  Vitals:   10/29/19 1537  TempSrc: Temporal  PainSc: 3                  Durenda Hurt

## 2019-10-31 ENCOUNTER — Ambulatory Visit: Payer: Medicare Other | Admitting: Pulmonary Disease

## 2019-10-31 ENCOUNTER — Ambulatory Visit: Payer: Medicare Other | Admitting: Oncology

## 2019-10-31 ENCOUNTER — Encounter: Payer: Self-pay | Admitting: *Deleted

## 2019-10-31 ENCOUNTER — Telehealth: Payer: Self-pay | Admitting: Pulmonary Disease

## 2019-10-31 NOTE — Telephone Encounter (Signed)
Discussed results of biopsy noted on 4 November.  Patient aware of results.  Has appointment with medical oncology on 10 November.

## 2019-11-04 ENCOUNTER — Inpatient Hospital Stay: Payer: Medicare Other | Attending: Oncology | Admitting: Oncology

## 2019-11-04 ENCOUNTER — Encounter: Payer: Self-pay | Admitting: *Deleted

## 2019-11-04 ENCOUNTER — Encounter: Payer: Self-pay | Admitting: Oncology

## 2019-11-04 ENCOUNTER — Other Ambulatory Visit: Payer: Self-pay | Admitting: *Deleted

## 2019-11-04 ENCOUNTER — Ambulatory Visit
Admission: RE | Admit: 2019-11-04 | Discharge: 2019-11-04 | Disposition: A | Discharge status: Home / Self Care | Payer: Medicare Other | Source: Ambulatory Visit | Attending: Radiation Oncology | Admitting: Radiation Oncology

## 2019-11-04 ENCOUNTER — Other Ambulatory Visit: Payer: Self-pay

## 2019-11-04 VITALS — BP 113/73 | HR 79 | Temp 96.5°F | Resp 16 | Wt 252.6 lb

## 2019-11-04 DIAGNOSIS — Z8774 Personal history of (corrected) congenital malformations of heart and circulatory system: Secondary | ICD-10-CM | POA: Diagnosis not present

## 2019-11-04 DIAGNOSIS — I252 Old myocardial infarction: Secondary | ICD-10-CM | POA: Diagnosis not present

## 2019-11-04 DIAGNOSIS — C3412 Malignant neoplasm of upper lobe, left bronchus or lung: Secondary | ICD-10-CM | POA: Diagnosis not present

## 2019-11-04 DIAGNOSIS — Z79899 Other long term (current) drug therapy: Secondary | ICD-10-CM | POA: Insufficient documentation

## 2019-11-04 DIAGNOSIS — F064 Anxiety disorder due to known physiological condition: Secondary | ICD-10-CM | POA: Insufficient documentation

## 2019-11-04 DIAGNOSIS — Z5111 Encounter for antineoplastic chemotherapy: Secondary | ICD-10-CM | POA: Diagnosis present

## 2019-11-04 DIAGNOSIS — N2889 Other specified disorders of kidney and ureter: Secondary | ICD-10-CM | POA: Insufficient documentation

## 2019-11-04 DIAGNOSIS — E1159 Type 2 diabetes mellitus with other circulatory complications: Secondary | ICD-10-CM | POA: Insufficient documentation

## 2019-11-04 DIAGNOSIS — Z7189 Other specified counseling: Secondary | ICD-10-CM | POA: Diagnosis not present

## 2019-11-04 DIAGNOSIS — Z87891 Personal history of nicotine dependence: Secondary | ICD-10-CM | POA: Insufficient documentation

## 2019-11-04 DIAGNOSIS — I2510 Atherosclerotic heart disease of native coronary artery without angina pectoris: Secondary | ICD-10-CM | POA: Insufficient documentation

## 2019-11-04 DIAGNOSIS — R5381 Other malaise: Secondary | ICD-10-CM | POA: Diagnosis not present

## 2019-11-04 DIAGNOSIS — Z951 Presence of aortocoronary bypass graft: Secondary | ICD-10-CM | POA: Insufficient documentation

## 2019-11-04 DIAGNOSIS — Z955 Presence of coronary angioplasty implant and graft: Secondary | ICD-10-CM | POA: Diagnosis not present

## 2019-11-04 DIAGNOSIS — M1990 Unspecified osteoarthritis, unspecified site: Secondary | ICD-10-CM | POA: Insufficient documentation

## 2019-11-04 DIAGNOSIS — C3490 Malignant neoplasm of unspecified part of unspecified bronchus or lung: Secondary | ICD-10-CM

## 2019-11-04 DIAGNOSIS — Z7982 Long term (current) use of aspirin: Secondary | ICD-10-CM | POA: Diagnosis not present

## 2019-11-04 DIAGNOSIS — E785 Hyperlipidemia, unspecified: Secondary | ICD-10-CM | POA: Insufficient documentation

## 2019-11-04 DIAGNOSIS — R0609 Other forms of dyspnea: Secondary | ICD-10-CM | POA: Diagnosis not present

## 2019-11-04 DIAGNOSIS — Z7901 Long term (current) use of anticoagulants: Secondary | ICD-10-CM | POA: Insufficient documentation

## 2019-11-04 DIAGNOSIS — K219 Gastro-esophageal reflux disease without esophagitis: Secondary | ICD-10-CM | POA: Insufficient documentation

## 2019-11-04 DIAGNOSIS — I10 Essential (primary) hypertension: Secondary | ICD-10-CM | POA: Diagnosis not present

## 2019-11-04 DIAGNOSIS — R5383 Other fatigue: Secondary | ICD-10-CM | POA: Insufficient documentation

## 2019-11-04 MED ORDER — MORPHINE SULFATE (CONCENTRATE) 10 MG /0.5 ML PO SOLN: 5.0000 mg | Freq: Four times a day (QID) | ORAL | 0 refills | Status: DC | PRN

## 2019-11-04 NOTE — Progress Notes (Signed)
Patient stated that he had been having some chest soreness and at night time he is not able to sleep but doesn't take anything to help him fall asleep. Patient also stated that at night time he has a dry cough with no phlegm production.

## 2019-11-04 NOTE — Progress Notes (Signed)
  Oncology Nurse Navigator Documentation  Navigator Location: [facility] (11/04/19 1500)   )Navigator Encounter Type: Follow-up Appt;Initial RadOnc (11/04/19 1500)     Confirmed Diagnosis Date: 10/30/19 (11/04/19 1500)               Patient Visit Type: MedOnc;RadOnc (11/04/19 1500) Treatment Phase: Pre-Tx/Tx Discussion (11/04/19 1500) Barriers/Navigation Needs: Anxiety;Coordination of Care;Education (11/04/19 1500) Education: Newly Diagnosed Cancer Education;Understanding Cancer/ Treatment Options (11/04/19 1500) Interventions: Coordination of Care;Education (11/04/19 1500)   Coordination of Care: Appts;Chemo (11/04/19 1500) Education Method: Verbal;Written (11/04/19 1500)         met with patient during follow up visit with Dr. Janese Banks to discuss biopsy results and treatment options. All questions answered during visit. Pt given resources regarding diagnosis and supportive services available. Reviewed upcoming appts. Pt appeared to be overwhelmed and tearful after visit. Reassurance and encouragement provided. Instructed pt to call with any further questions or needs. Pt verbalized understanding. Nothing further needed at this time.       Time Spent with Patient: 90 (11/04/19 1500)

## 2019-11-04 NOTE — Consult Note (Signed)
NEW PATIENT EVALUATION  Name: Derrick Gallegos  MRN: [id number]  Date:   11/04/2019     DOB: 05-06-1951   This 68 y.o. 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  REFERRING PHYSICIAN: [facility], [place]:  Chief Complaint  Patient presents with  . Lung Cancer    Initial consultation    DIAGNOSIS: The encounter diagnosis was Malignant neoplasm of bronchus and lung ([place]).   PREVIOUS INVESTIGATIONS:  CT scans PET CT scans and MRI of brain all reviewed Pathology report reviewed Clinical notes reviewed  HPI: Patient is a 68 year old male greater than 50-pack-year smoking history who complained of what he felt like pneumonia type symptoms over the past several months.  This eventually ended with a ER visit with increased symptoms of cough shortness of breath.  CT scan at that time showed a 3.4 x 3.2 cm left parahilar mass with left paratracheal precarinal and subcarinal adenopathy.  PET CT scan confirmed hypermetabolic spiculated nodule in the left upper lobe with an SUV of 10 compatible with malignancy.  There was also left supraclavicular ipsilateral and contralateral mediastinal left hilar left infrahilar and subcarinal hypermetabolic adenopathy, patible with nodal spread.  No evidence of disease outside the chest abdomen pelvis or skeletal regions.  He did have a 3.4 cm partially exophytic lesion of the right upper pole of the kidney with hypermetabolic activity concerning for malignancy and that has been referred to urology.  Patient underwent bronchoscopy with tissue diagnosis positive for squamous cell carcinoma.  He has been seen by medical oncology with recommendation for concurrent chemoradiation.  He is seen today for radiation oncology evaluation he is doing fairly well he states he has some diminished appetite has a mild cough no hemoptysis still some chest  tightness.  MRI scan of his brain was negative for malignancy  PLANNED TREATMENT REGIMEN: Concurrent chemoradiation  PAST MEDICAL HISTORY:  has a past medical history of Anxiety, Arthritis, Atrial flutter ([place]), Complication of anesthesia, Coronary artery disease, Cough, Depression, Diabetes mellitus with complication ([place]), Dyspnea, Dysrhythmia, Essential hypertension, Family history of premature CAD, GERD (gastroesophageal reflux disease), Heart murmur, HLD (hyperlipidemia), Mass of left lung, MI (myocardial infarction) ([place]), and Obesity.    PAST SURGICAL HISTORY:  Past Surgical History:  Procedure Laterality Date  . CARDIAC CATHETERIZATION  2004  . CARDIAC CATHETERIZATION  2019   x2 stents [facility]  . CARDIAC SURGERY    . CARDIOVERSION N/A 06/20/2018   Procedure: CARDIOVERSION;  Surgeon: Dionisio David, MD;  Location: [facility] ORS;  Service: Cardiovascular;  Laterality: N/A;  . CORONARY ARTERY BYPASS GRAFT    . CORONARY STENT INTERVENTION N/A 08/08/2018   Procedure: CORONARY STENT INTERVENTION;  Surgeon: Isaias Cowman, MD;  Location: [place] [facility];  Service: Cardiovascular;  Laterality: N/A;  . LEFT HEART CATH AND CORONARY ANGIOGRAPHY Left 08/08/2018   Procedure: LEFT HEART CATH AND CORONARY ANGIOGRAPHY;  Surgeon: Dionisio David, MD;  Location: [place] [facility];  Service: Cardiovascular;  Laterality: Left;  . TEE WITHOUT CARDIOVERSION N/A 06/20/2018   Procedure: TRANSESOPHAGEAL ECHOCARDIOGRAM (TEE);  Surgeon: Dionisio David, MD;  Location: [facility];  Service: Cardiovascular;  Laterality: N/A;  . VIDEO BRONCHOSCOPY N/A 10/29/2019   Procedure: VIDEO BRONCHOSCOPY WITH ENDOBRONCHICAL ULTRASOUND;  Surgeon: Tyler Pita, MD;  Location: [facility] OR[facility];  Service: Cardiopulmonary;  Laterality: N/A;    FAMILY HISTORY: family history includes CAD in his  brother, father, and mother.  SOCIAL HISTORY:  reports that he quit smoking about 16 years ago. His smoking use included cigarettes. He  uses smokeless tobacco. He reports that he does not drink alcohol or use drugs.  ALLERGIES: Patient has no known allergies.  MEDICATIONS:  Current Outpatient Medications  Medication Sig Dispense Refill  . acetaminophen (TYLENOL) 500 MG tablet Take 1,000 mg by mouth every 6 (six) hours as needed for moderate pain.     Marland Kitchen aspirin EC 81 MG tablet Take 1 tablet (81 mg total) by mouth daily. 90 tablet 3  . empagliflozin (JARDIANCE) 25 MG TABS tablet Take 25 mg by mouth daily.    Marland Kitchen gabapentin (NEURONTIN) 300 MG capsule Take 300 mg by mouth at bedtime.     Marland Kitchen glipiZIDE (GLUCOTROL) 10 MG tablet Take 10 mg by mouth 2 (two) times daily before a meal.    . Glycopyrrolate-Formoterol (BEVESPI AEROSPHERE) 9-4.8 MCG/ACT AERO Inhale 2 puffs into the lungs 2 (two) times daily. 2 g 0  . HYDROcodone-homatropine (HYCODAN) 5-1.5 MG/5ML syrup Take 5 mLs by mouth every 6 (six) hours as needed for cough. 120 mL 0  . LORazepam (ATIVAN) 0.5 MG tablet Take 1 tablet (0.5 mg total) by mouth every 6 (six) hours as needed for anxiety. 30 tablet 0  . losartan (COZAAR) 50 MG tablet Take 50 mg by mouth daily.     . metoprolol tartrate (LOPRESSOR) 25 MG tablet Take 1 tablet (25 mg total) by mouth 2 (two) times daily. 180 tablet 0  . Morphine Sulfate (MORPHINE CONCENTRATE) 10 mg / 0.5 ml concentrated solution Take 0.25 mLs (5 mg total) by mouth every 6 (six) hours as needed for shortness of breath. 30 mL 0  . Omega-3 Fatty Acids (FISH OIL) 1200 MG CAPS Take 2,400 mg by mouth at bedtime.     Marland Kitchen omeprazole (PRILOSEC) 20 MG capsule Take 1 capsule by mouth as needed.    . rivaroxaban (XARELTO) 20 MG TABS tablet Take 1 tablet (20 mg total) by mouth daily with supper. (Patient not taking: Reported on 11/04/2019) 90 tablet 2   No current facility-administered medications for this encounter.     ECOG PERFORMANCE STATUS:  1 - Symptomatic but completely ambulatory  REVIEW OF SYSTEMS: Patient denies any weight loss, fatigue, weakness,  fever, chills or night sweats. Patient denies any loss of vision, blurred vision. Patient denies any ringing  of the ears or hearing loss. No irregular heartbeat. Patient denies heart murmur or history of fainting. Patient denies any chest pain or pain radiating to her upper extremities. Patient denies any shortness of breath, difficulty breathing at night, cough or hemoptysis. Patient denies any swelling in the lower legs. Patient denies any nausea vomiting, vomiting of blood, or coffee ground material in the vomitus. Patient denies any stomach pain. Patient states has had normal bowel movements no significant constipation or diarrhea. Patient denies any dysuria, hematuria or significant nocturia. Patient denies any problems walking, swelling in the joints or loss of balance. Patient denies any skin changes, loss of hair or loss of weight. Patient denies any excessive worrying or anxiety or significant depression. Patient denies any problems with insomnia. Patient denies excessive thirst, polyuria, polydipsia. Patient denies any swollen glands, patient denies easy bruising or easy bleeding. Patient denies any recent infections, allergies or URI. Patient "s visual fields have not changed significantly in recent time.   PHYSICAL EXAM: There were no vitals taken for this visit. Well-developed well-nourished patient in NAD. HEENT reveals  PERLA, EOMI, discs not visualized.  Oral cavity is clear. No oral mucosal lesions are identified. Neck is clear without evidence of cervical or supraclavicular adenopathy. Lungs are clear to A&P. Cardiac examination is essentially unremarkable with regular rate and rhythm without murmur rub or thrill. Abdomen is benign with no organomegaly or masses noted. Motor sensory and DTR levels are equal and symmetric in the upper and lower extremities. Cranial nerves II through XII are grossly intact. Proprioception is intact. No peripheral adenopathy or edema is identified. No motor or  sensory levels are noted. Crude visual fields are within normal range.  LABORATORY DATA: Pathology report reviewed    RADIOLOGY RESULTS: CT scans PET CT and MRI of brain all reviewed compatible with above-stated findings   IMPRESSION: Stage IIIc squamous cell carcinoma of the left upper lobe in 68 year old male  PLAN: At this time like to go ahead with radiation therapy to his chest incorporating all areas of hypermetabolic activity.  We will treat with IMRT treatment planning and delivery.  I would choose IMRT based on my ability to dose paint selective areas of nodal involvement as as well as to avoid esophagus and spinal cord and normal lung volume.  Risks and benefits of treatment including possible radiation esophagitis fatigue alteration of blood count skin reaction cough all were reviewed with the patient.  There will be extra effort by both professional staff as well as technical staff to coordinate and manage concurrent chemoradiation and ensuing side effects during his treatments.  I have personally set up and ordered CT simulation for later this week.  Would use PET CT fusion study for treatment planning.  Patient comprehends my treatment plan well.  His daughter was on the phone during my consultation.  Nurse navigator was also present during my consultation.  I would like to take this opportunity to thank you for allowing me to participate in the care of your patient.Noreene Filbert, MD

## 2019-11-05 ENCOUNTER — Other Ambulatory Visit: Payer: Self-pay

## 2019-11-06 ENCOUNTER — Ambulatory Visit
Admission: RE | Admit: 2019-11-06 | Discharge: 2019-11-06 | Disposition: A | Discharge status: Home / Self Care | Payer: Medicare Other | Source: Ambulatory Visit | Attending: Radiation Oncology | Admitting: Radiation Oncology

## 2019-11-06 ENCOUNTER — Other Ambulatory Visit
Admission: RE | Admit: 2019-11-06 | Discharge: 2019-11-06 | Disposition: A | Discharge status: Home / Self Care | Payer: Medicare Other | Source: Ambulatory Visit | Attending: Vascular Surgery | Admitting: Vascular Surgery

## 2019-11-06 ENCOUNTER — Other Ambulatory Visit: Payer: Self-pay

## 2019-11-06 DIAGNOSIS — Z510 Encounter for antineoplastic radiation therapy: Secondary | ICD-10-CM | POA: Diagnosis present

## 2019-11-06 DIAGNOSIS — Z20828 Contact with and (suspected) exposure to other viral communicable diseases: Secondary | ICD-10-CM | POA: Insufficient documentation

## 2019-11-06 DIAGNOSIS — C3412 Malignant neoplasm of upper lobe, left bronchus or lung: Secondary | ICD-10-CM | POA: Insufficient documentation

## 2019-11-06 DIAGNOSIS — Z7189 Other specified counseling: Secondary | ICD-10-CM | POA: Insufficient documentation

## 2019-11-06 DIAGNOSIS — Z01812 Encounter for preprocedural laboratory examination: Secondary | ICD-10-CM | POA: Diagnosis present

## 2019-11-06 MED ORDER — LIDOCAINE-PRILOCAINE 2.5-2.5 % EX CREA: TOPICAL_CREAM | CUTANEOUS | 3 refills | Status: DC

## 2019-11-06 MED ORDER — PROCHLORPERAZINE MALEATE 10 MG PO TABS: 10.0000 mg | ORAL_TABLET | Freq: Four times a day (QID) | ORAL | 1 refills | Status: DC | PRN

## 2019-11-06 MED ORDER — ONDANSETRON HCL 8 MG PO TABS: 8.0000 mg | ORAL_TABLET | Freq: Two times a day (BID) | ORAL | 1 refills | Status: DC | PRN

## 2019-11-06 MED ORDER — DEXAMETHASONE 4 MG PO TABS: 8.0000 mg | ORAL_TABLET | Freq: Every day | ORAL | 1 refills | Status: DC

## 2019-11-06 NOTE — Research (Signed)
I met with patient todayafter his CT simulation appointmenttoreviewstudy "Procurement of Human Biospecimens for the Discovery and Validation of Biomarkers for the Predication, Diagnosis and Management of Disease" sponsored by [employer]. Iexplained the study to patient including purpose of the study, risks/benefits, participation requirements, voluntary participation and reimbursement provided by the study. Study requires patient to give three vials of whole blood to [employer]. [employer] does provide compensation in the form of a gift card. Patient agreeable to participation. Patient signed consent and HIPPA for study and copy of signed forms given to patient.  Per study guidelines, patient was asked ifhe had Novocain in the past week or had any history of cancer. Patient answered no to both of these questions. (If the patient were to have answered yes, he would be disqualified from study). Blood for study will be collected at his next lab visit. I thanked patient for his participation. Lula Olszewski Oncology Research associate 11/06/2019 1406  Patient presented today for his first infusion and port access.  Blood collected to complete study visit at that time.  Patient was given gift card provided by study.  I thanked patient for his participation.  Lula Olszewski 11/17/2019 09:01 am

## 2019-11-06 NOTE — Progress Notes (Signed)
START ON PATHWAY REGIMEN - Non-Small Cell Lung     Administer weekly:     Paclitaxel      Carboplatin   **Always confirm dose/schedule in your pharmacy ordering system**  Patient Characteristics: Stage III - Unresectable, PS = 0, 1 AJCC T Category: T3 Current Disease Status: No Distant Mets or Local Recurrence AJCC N Category: N3 AJCC M Category: M0 AJCC 8 Stage Grouping: IIIC ECOG Performance Status: 1 Intent of Therapy: Curative Intent, Discussed with Patient

## 2019-11-06 NOTE — Progress Notes (Signed)
Hematology/Oncology Consult note [facility]  Telephone:([phone] [phone]  Patient Care Team: Center, [facility] as PCP - General ([place]) Telford Nab, RN as Registered Nurse   Name of the patient: Derrick Gallegos  [id number]  08-24-1951   Date of visit: 11/06/19  Diagnosis- Stage IIIC SCC lung cT3cN3cM0  Chief complaint/ Reason for visit- discuss pathology results and further management  Heme/Onc history: patient is a 68 year old malewho was a former smoker and smoked about 2 to 3 packs/day for many years and quit smoking a few years ago. He presented to the ER with symptoms of cough and shortness of breathWhich prompted [place] chest x-ray followed by a CT scan. CT scan showed a 3.4 x 3.2 cm left perihilar mass along the left paratracheal precarinal and subcarinal vascular and left hilar adenopathy. Patient has been referred for further management.  PET CT scan showed a large left upper lobe lung mass measuring 6.3 x 3.4 cm.  Bilateral mediastinal adenopathy as well as left supraclavicular adenopathy. 3.4 cm partially exophytic lesion of the right kidney.  No evidence of distant metastatic disease.   Interval history- reports feeling anxious. He has sob on exertion. Denies any pain  ECOG PS- 1 Pain scale- 0 Opioid associated constipation- no  Review of systems- Review of Systems  Constitutional: Positive for malaise/fatigue. Negative for chills, fever and weight loss.  HENT: Negative for congestion, ear discharge and nosebleeds.   Eyes: Negative for blurred vision.  Respiratory: Positive for shortness of breath. Negative for cough, hemoptysis, sputum production and wheezing.   Cardiovascular: Negative for chest pain, palpitations, orthopnea and claudication.  Gastrointestinal: Negative for abdominal pain, blood in stool, constipation, diarrhea, heartburn, melena, nausea and vomiting.  Genitourinary: Negative  for dysuria, flank pain, frequency, hematuria and urgency.  Musculoskeletal: Negative for back pain, joint pain and myalgias.  Skin: Negative for rash.  Neurological: Negative for dizziness, tingling, focal weakness, seizures, weakness and headaches.  Endo/Heme/Allergies: Does not bruise/bleed easily.  Psychiatric/Behavioral: Negative for depression and suicidal ideas. The patient is nervous/anxious. The patient does not have insomnia.       No Known Allergies   Past Medical History:  Diagnosis Date   Anxiety    Arthritis    Atrial flutter ([place])    a. diagnosed 5/19; b. s/p TEE/DCCV 05/2018 by Dr. Humphrey Rolls; c. CHADS2VASc => 4 (HTN, age x1, DM, vascular disease)   Complication of anesthesia    "wakes up too early"   Coronary artery disease    a. s/p 3-v cabg in 2004 at [place] (LIMA and radial arery used - no further details in Care Everywhere); b. LHC 8/19 underlying multi-V dz, patent LIMA-LAD, occluded VG-PDA, 90-95% stenosis of LCx s/p PCI/DES x 2   Cough    Depression    Diabetes mellitus with complication (HCC)    Dyspnea    Dysrhythmia    atrial fib.   Essential hypertension    Family history of premature CAD    a. father passed away from an MI at 68   GERD (gastroesophageal reflux disease)    Heart murmur    HLD (hyperlipidemia)    a. statin intolerant    Mass of left lung    MI (myocardial infarction) [facility])    2004   Obesity      Past Surgical History:  Procedure Laterality Date   CARDIAC CATHETERIZATION  2004   CARDIAC CATHETERIZATION  2019   x2 stents [facility]  CARDIAC SURGERY     CARDIOVERSION N/A 06/20/2018   Procedure: CARDIOVERSION;  Surgeon: Dionisio David, MD;  Location: [facility] ORS;  Service: Cardiovascular;  Laterality: N/A;   CORONARY ARTERY BYPASS GRAFT     CORONARY STENT INTERVENTION N/A 08/08/2018   Procedure: CORONARY STENT INTERVENTION;  Surgeon: Isaias Cowman, MD;  Location: [place] CV LAB;  Service: Cardiovascular;   Laterality: N/A;   LEFT HEART CATH AND CORONARY ANGIOGRAPHY Left 08/08/2018   Procedure: LEFT HEART CATH AND CORONARY ANGIOGRAPHY;  Surgeon: Dionisio David, MD;  Location: [place] CV LAB;  Service: Cardiovascular;  Laterality: Left;   TEE WITHOUT CARDIOVERSION N/A 06/20/2018   Procedure: TRANSESOPHAGEAL ECHOCARDIOGRAM (TEE);  Surgeon: Dionisio David, MD;  Location: [facility] ORS;  Service: Cardiovascular;  Laterality: N/A;   VIDEO BRONCHOSCOPY N/A 10/29/2019   Procedure: VIDEO BRONCHOSCOPY WITH ENDOBRONCHICAL ULTRASOUND;  Surgeon: Tyler Pita, MD;  Location: [facility] ORS;  Service: Cardiopulmonary;  Laterality: N/A;    Social History   Socioeconomic History   Marital status: Married    Spouse name: Not on file   Number of children: Not on file   Years of education: Not on file   Highest education level: Not on file  Occupational History   Not on file  Social Needs   Financial resource strain: Not on file   Food insecurity    Worry: Not on file    Inability: Not on file   Transportation needs    Medical: Not on file    Non-medical: Not on file  Tobacco Use   Smoking status: Former Smoker    Types: Cigarettes    Quit date: 2004    Years since quitting: 16.8   Smokeless tobacco: Current User   Tobacco comment: Quit 2004  Substance and Sexual Activity   Alcohol use: No   Drug use: No   Sexual activity: Not on file  Lifestyle   Physical activity    Days per week: Not on file    Minutes per session: Not on file   Stress: Not on file  Relationships   Social connections    Talks on phone: Not on file    Gets together: Not on file    Attends religious service: Not on file    Active member of club or organization: Not on file    Attends meetings of clubs or organizations: Not on file    Relationship status: Not on file   Intimate partner violence    Fear of current or ex partner: Not on file    Emotionally abused: Not on file    Physically abused:  Not on file    Forced sexual activity: Not on file  Other Topics Concern   Not on file  Social History Narrative   Not on file    Family History  Problem Relation Age of Onset   CAD Mother    CAD Father        a. MI at 57-->death   CAD Brother      Current Outpatient Medications:    acetaminophen (TYLENOL) 500 MG tablet, Take 1,000 mg by mouth every 6 (six) hours as needed for moderate pain. , Disp: , Rfl:    aspirin EC 81 MG tablet, Take 1 tablet (81 mg total) by mouth daily., Disp: 90 tablet, Rfl: 3   empagliflozin (JARDIANCE) 25 MG TABS tablet, Take 25 mg by mouth daily., Disp: , Rfl:    gabapentin (NEURONTIN) 300 MG capsule, Take 300 mg  by mouth at bedtime. , Disp: , Rfl:    glipiZIDE (GLUCOTROL) 10 MG tablet, Take 10 mg by mouth 2 (two) times daily before a meal., Disp: , Rfl:    Glycopyrrolate-Formoterol (BEVESPI AEROSPHERE) 9-4.8 MCG/ACT AERO, Inhale 2 puffs into the lungs 2 (two) times daily., Disp: 2 g, Rfl: 0   HYDROcodone-homatropine (HYCODAN) 5-1.5 MG/5ML syrup, Take 5 mLs by mouth every 6 (six) hours as needed for cough., Disp: 120 mL, Rfl: 0   LORazepam (ATIVAN) 0.5 MG tablet, Take 1 tablet (0.5 mg total) by mouth every 6 (six) hours as needed for anxiety., Disp: 30 tablet, Rfl: 0   losartan (COZAAR) 50 MG tablet, Take 50 mg by mouth daily. , Disp: , Rfl:    metoprolol tartrate (LOPRESSOR) 25 MG tablet, Take 1 tablet (25 mg total) by mouth 2 (two) times daily., Disp: 180 tablet, Rfl: 0   Omega-3 Fatty Acids (FISH OIL) 1200 MG CAPS, Take 2,400 mg by mouth at bedtime. , Disp: , Rfl:    omeprazole (PRILOSEC) 20 MG capsule, Take 1 capsule by mouth as needed., Disp: , Rfl:    Morphine Sulfate (MORPHINE CONCENTRATE) 10 mg / 0.5 ml concentrated solution, Take 0.25 mLs (5 mg total) by mouth every 6 (six) hours as needed for shortness of breath., Disp: 30 mL, Rfl: 0   rivaroxaban (XARELTO) 20 MG TABS tablet, Take 1 tablet (20 mg total) by mouth daily with  supper. (Patient not taking: Reported on 11/04/2019), Disp: 90 tablet, Rfl: 2  Physical exam:  Vitals:   11/04/19 1315  BP: 113/73  Pulse: 79  Resp: 16  Temp: (!) 96.5 F (35.8 C)  TempSrc: Temporal  SpO2: 99%  Weight: 252 lb 9.6 oz (114.6 kg)   Physical Exam Constitutional:      General: He is not in acute distress. HENT:     Head: Normocephalic and atraumatic.  Eyes:     Pupils: Pupils are equal, round, and reactive to light.  Neck:     Musculoskeletal: Normal range of motion.  Cardiovascular:     Rate and Rhythm: Normal rate and regular rhythm.     Heart sounds: Normal heart sounds.  Pulmonary:     Effort: Pulmonary effort is normal.     Breath sounds: Normal breath sounds.  Abdominal:     General: Bowel sounds are normal.     Palpations: Abdomen is soft.  Skin:    General: Skin is warm and dry.  Neurological:     Mental Status: He is alert and oriented to person, place, and time.      CMP Latest Ref Rng & Units 10/09/2019  Glucose 70 - 99 mg/dL 144(H)  BUN 8 - 23 mg/dL 16  Creatinine 0.61 - 1.24 mg/dL 0.65  Sodium 135 - 145 mmol/L 137  Potassium 3.5 - 5.1 mmol/L 3.4(L)  Chloride 98 - 111 mmol/L 103  CO2 22 - 32 mmol/L 20(L)  Calcium 8.9 - 10.3 mg/dL 9.6  Total Protein 6.0 - 8.5 g/dL -  Total Bilirubin 0.0 - 1.2 mg/dL -  Alkaline Phos 39 - 117 IU/L -  AST 0 - 40 IU/L -  ALT 0 - 44 IU/L -   CBC Latest Ref Rng & Units 10/09/2019  WBC 4.0 - 10.5 K/uL 8.1  Hemoglobin 13.0 - 17.0 g/dL 15.7  Hematocrit 39.0 - 52.0 % 45.5  Platelets 150 - 400 K/uL 339    No images are attached to the encounter.  Dg Chest 2 View  Result  Date: 10/09/2019 CLINICAL DATA:  Chest pain. EXAM: CHEST - 2 VIEW COMPARISON:  11/27/2018. FINDINGS: Prior CABG. Fractured median sternotomy wires again noted and are unchanged. Heart size normal. Left upper lobe density is noted. Although pneumonia could present this fashion this density is worrisome for mass lesion. Follow-up PA and  lateral chest x-ray suggested to demonstrate resolution. If these findings do not resolve chest CT suggested for further evaluation. IMPRESSION: Left upper prominent density is noted. Although pneumonia could present in this fashion, this density is worrisome for mass lesion. Follow-up PA and lateral chest x-ray suggested to demonstrate resolution. If these findings do not resolve chest CT suggested for further evaluation as malignancy cannot be excluded. Electronically Signed   By: [place]   On: 10/09/2019 12:46   Ct Chest W Contrast  Result Date: 10/09/2019 CLINICAL DATA:  Chest pain.  Shortness of breath. EXAM: CT CHEST WITH CONTRAST TECHNIQUE: Multidetector CT imaging of the chest was performed during intravenous contrast administration. CONTRAST:  17mL OMNIPAQUE IOHEXOL 300 MG/ML  SOLN COMPARISON:  Radiograph of same day. FINDINGS: Cardiovascular: No evidence of thoracic aortic dissection or aneurysm. Status post coronary bypass graft. Normal cardiac size. No pericardial effusion. Mediastinum/Nodes: Thyroid gland and esophagus are unremarkable. 3 cm precarinal lymph node is noted. 2.3 cm left paratracheal lymph node is noted. 14 mm subcarinal lymph node is noted. 14 mm prevascular lymph node is noted. 15 mm left hilar lymph node is noted. Lungs/Pleura: No pneumothorax or pleural effusion is noted. Right lung is clear. 3.4 x 3.2 cm left perihilar mass is noted concerning for malignancy, with resulting postobstructive atelectasis of the left upper lobe. Upper Abdomen: No acute abnormality. Musculoskeletal: No chest wall abnormality. No acute or significant osseous findings. IMPRESSION: Approximately 3.4 cm left perihilar mass is noted concerning for malignancy, which results in some degree of postobstructive atelectasis of the left upper lobe. PET scan is recommended for further evaluation. Left paratracheal, precarinal, subcarinal, prevascular and left hilar adenopathy is noted concerning for  metastatic disease. Status post coronary bypass graft. Electronically Signed   By: Marijo Conception M.D.   On: 10/09/2019 14:35   Mr Jeri Cos CX Contrast  Result Date: 10/22/2019 CLINICAL DATA:  Lung mass. Additional history provided: Dizziness and deteriorating eyesight. EXAM: MRI HEAD WITHOUT AND WITH CONTRAST TECHNIQUE: Multiplanar, multiecho pulse sequences of the brain and surrounding structures were obtained without and with intravenous contrast. CONTRAST:  62mL GADAVIST GADOBUTROL 1 MMOL/ML IV SOLN COMPARISON:  Head CT 11/14/2011 FINDINGS: Brain: There is no evidence of acute infarct. No evidence of intracranial mass. No midline shift or extra-axial fluid collection. No chronic intracranial blood products. Mild scattered T2/FLAIR hyperintensity within the cerebral white matter is nonspecific, but consistent with chronic small vessel ischemic disease. Cerebral volume is normal for age. No abnormal intracranial enhancement is identified. Vascular: Flow voids maintained within the proximal large arterial vessels. Skull and upper cervical spine: No focal marrow lesion Sinuses/Orbits: Visualized orbits demonstrate no acute abnormality. Trace ethmoid sinus mucosal thickening. No significant mastoid effusion. IMPRESSION: 1. No evidence of acute intracranial abnormality or intracranial metastatic disease. 2. Mild chronic small vessel ischemic changes within the cerebral white matter. Electronically Signed   By: Kellie Simmering DO   On: 10/22/2019 23:24   Nm Pet Image Initial (pi) Skull Base To Thigh  Result Date: 10/14/2019 CLINICAL DATA:  Initial treatment strategy for left upper lobe mass. EXAM: NUCLEAR MEDICINE PET SKULL BASE TO THIGH TECHNIQUE: 13.6 mCi F-18 FDG was injected  intravenously. Full-ring PET imaging was performed from the skull base to thigh after the radiotracer. CT data was obtained and used for attenuation correction and anatomic localization. Fasting blood glucose: 156 mg/dl COMPARISON:  CT  chest 10/09/2019 FINDINGS: Mediastinal blood pool activity: SUV max 2.3 Liver activity: SUV max NA NECK: No significant abnormal hypermetabolic activity in this region. Incidental CT findings: none CHEST: Spiculated left upper lobe central mass has a maximum SUV of 10.1 and measures approximately 6.3 by 3.4 cm. This mass has spiculated margins postobstructive atelectasis. There is associated left supraclavicular, prevascular, right and left paratracheal, AP window, subcarinal, left hilar, and left infrahilar adenopathy. Index left supraclavicular node 1.7 cm in short axis on image 69/3, maximum SUV 7.6. Index right paratracheal nodal conglomerate 2.8 cm in short axis on image 93/3, maximum SUV 10.1. Subcarinal node 1.3 cm in short axis on image 107/3, maximum SUV 10.0. Incidental CT findings: Coronary, aortic arch, and branch vessel atherosclerotic vascular disease. Prior CABG. ABDOMEN/PELVIS: A partially exophytic lesion of the right kidney upper pole measuring 2.9 by 3.4 cm on image 179/3 has a suggestion of associated hypermetabolic activity which would be highly unusual for cyst, raising concern for a solid renal mass such as renal cell carcinoma. SUV in this lesion up to 3.5. Incidental CT findings: Aortoiliac atherosclerotic vascular disease. SKELETON: No significant abnormal hypermetabolic activity in this region. Incidental CT findings: none IMPRESSION: 1. The spiculated left upper lobe mass has a maximum SUV of 10.1 compatible with malignancy. There is associated left supraclavicular, ipsilateral and contralateral mediastinal, left hilar, left infrahilar, and subcarinal hypermetabolic adenopathy compatible with nodal spread. No findings of additional involvement of the neck, abdomen/pelvis, or skeleton. 2. A 3.4 cm partially exophytic lesion of the right kidney upper pole appears to have metabolic activity, making it concerning for a independent mass such as renal cell carcinoma. Options for workup may  include renal ultrasound or renal protocol MRI with and without contrast. 3. Aortic Atherosclerosis (ICD10-I70.0).  Coronary atherosclerosis. Electronically Signed   By: Van Clines M.D.   On: 10/14/2019 14:56     Assessment and plan- Patient is a 68 y.o. male with newly diagnosed SCC of the lung Stage IIIC cT3cN3cM0.  He is here to discuss pathology results and further management  Discussed the results of pathology with the patient in detail which showed squamous cell carcinoma based on bronchoscopy involving the left upper lobe and lymph node sampling.  PET CT scan shows bilateral mediastinal adenopathy as well as ipsilateral supraclavicular adenopathy.  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  No evidence of distant metastatic disease and MRI brain also did not show any evidence of metastasis.   I recommend weekly chemotherapy with carbotaxol AUC 2 and Taxol at 50 mg per metered squared given concurrently with radiation.  He will need chemotherapy teach and port placement for chemotherapy.  Discussed risks and benefits of chemo including all but not limited to nausea, vomiting, low blood counts, risk of infections and hospitalization.  Risk of peripheral neuropathy and hair loss associated with Taxol.  Patient understands and agrees to proceed as planned.  Treatment will be given with a curative intent.  I will tentatively see him back in 1 week's time to start cycle 1 of carbotaxol chemotherapy.  Plan is to get repeat scans after concurrent chemoradiation.  Scans show partial response or stable disease we will proceed to maintenance durvalumab for 1 year at that time.  I did  give him a low-dose of morphine 5 mg every 4 hours as needed to help him with his shortness of breath which should hopefully get better with palliative radiation treatment as well.  He will continue as needed Ativan for his anxiety  Cancer Staging Malignant neoplasm of upper lobe of  left lung [facility] [place]) Staging form: Lung, AJCC 8th Edition - Clinical stage from 11/04/2019: Stage IIIC (cT3, cN3, cM0) - Signed by Sindy Guadeloupe, MD on 11/06/2019   Total face to face encounter time for this patient visit was 40 min. >50% of the time was  spent in counseling and coordination of care.   Visit Diagnosis 1. Malignant neoplasm of upper lobe of left lung ([place])   2. Goals of care, counseling/discussion      [name], MD, MPH [facility] [phone] 11/06/2019 12:03 PM

## 2019-11-07 ENCOUNTER — Ambulatory Visit (INDEPENDENT_AMBULATORY_CARE_PROVIDER_SITE_OTHER): Payer: Medicare Other | Admitting: Pulmonary Disease

## 2019-11-07 ENCOUNTER — Encounter: Payer: Self-pay | Admitting: Pulmonary Disease

## 2019-11-07 VITALS — BP 124/68 | HR 75 | Temp 97.7°F | Ht 71.0 in | Wt 255.0 lb

## 2019-11-07 DIAGNOSIS — R0602 Shortness of breath: Secondary | ICD-10-CM | POA: Diagnosis not present

## 2019-11-07 DIAGNOSIS — J449 Chronic obstructive pulmonary disease, unspecified: Secondary | ICD-10-CM | POA: Diagnosis not present

## 2019-11-07 DIAGNOSIS — C3402 Malignant neoplasm of left main bronchus: Secondary | ICD-10-CM

## 2019-11-07 LAB — SARS CORONAVIRUS 2 (TAT 6-24 HRS): SARS Coronavirus 2: NEGATIVE

## 2019-11-07 MED ORDER — BEVESPI AEROSPHERE 9-4.8 MCG/ACT IN AERO: 2.0000 | INHALATION_SPRAY | Freq: Two times a day (BID) | RESPIRATORY_TRACT | 11 refills | Status: DC

## 2019-11-07 NOTE — Progress Notes (Signed)
   Subjective:    Patient ID: Derrick Gallegos, male    DOB: [birth year], 68 y.o.   MRN: [id number]  HPI  Is a 68 year old former smoker (quit 2004) who presents after Undergoing bronchoscopy on 29 October 2019 for a left lung mass.  Pathology is consistent with cell carcinoma.  Patient is a stage III unresectable squamous cell carcinoma and will chemotherapy per direction of [name].  She did not have any untoward issues with bronchoscopy and has no new respiratory symptomatology today.  Continues to have dry cough, no hemoptysis.  Shortness of breath as well.  He is to get a port placed for chemotherapy.  Shortness of breath and cough related to his large tumor burden.  He is on Bevespi for likely COPD.  Deferring PFTs at present as patient requires management of his lung cancer.  No fevers, chills or sweats.  No hemoptysis post bronchoscopy.  Review of Systems A 10 point review of systems was performed and it is as noted above otherwise negative.  No Known Allergies  Medications reviewed with the patient.  Immunization History  Administered Date(s) Administered  . Fluad Quad(high Dose 65+) 11/26/2019       Objective:   Physical Exam  BP 124/68 (BP Location: Left Arm, Cuff Size: Normal)   Pulse 75   Temp 97.7 F (36.5 C) (Temporal)   Ht 5\' 11"  (1.803 m)   Wt 255 lb (115.7 kg)   SpO2 96%   BMI 35.57 kg/m   GENERAL: Awake, alert, ambulatory.  No distress.  Not toxic appearing. HEAD: Normocephalic, atraumatic.  EYES: Pupils equal, round, reactive to light.  No scleral icterus.  MOUTH: Nose/mouth/throat not examined due to masking requirements for COVID 19. NECK: Supple. No thyromegaly. Trachea midline. No JVD.  No adenopathy. PULMONARY: Coarse breath sounds bilaterally. CARDIOVASCULAR: S1 and S2. Regular rate and rhythm.  No rubs, murmurs or gallops heard. GASTROINTESTINAL: Benign. MUSCULOSKELETAL: No joint deformity, no clubbing, no edema.  NEUROLOGIC: No focal deficits.  SKIN: Intact,warm,dry. PSYCH: Mood and behavior normal.     Assessment & Plan:     ICD-10-CM   1. Malignant neoplasm of hilus of left lung ([place])  C34.02    Cell carcinoma by biopsy performed 4 November Chemotherapy per oncology  2. Shortness of breath  R06.02    Likely related to tumor burden Underlying COPD Continue Bevespi  3. COPD suggested by initial evaluation ([place])  J44.9    Continue Bevespi   Meds ordered this encounter  Medications  . Glycopyrrolate-Formoterol (BEVESPI AEROSPHERE) 9-4.8 MCG/ACT AERO    Sig: Inhale 2 puffs into the lungs 2 (two) times daily.    Dispense:  10.7 g    Refill:  11   Follow-up in 6 to 8 weeks time, call sooner should any new difficulties arise.  Renold Don, MD [facility]   *This note was dictated using voice recognition software/Dragon.  Despite best efforts to proofread, errors can occur which can change the meaning.  Any change was purely unintentional.

## 2019-11-07 NOTE — Patient Instructions (Signed)
1.  We will continue the [place] for now  2.  I believe that your shortness of breath and your cough will get better once you get treatment for your lung cancer.  3.  We will see you back in 6 to 8 weeks time.

## 2019-11-09 ENCOUNTER — Other Ambulatory Visit (INDEPENDENT_AMBULATORY_CARE_PROVIDER_SITE_OTHER): Payer: Self-pay | Admitting: Nurse Practitioner

## 2019-11-10 ENCOUNTER — Other Ambulatory Visit: Payer: Self-pay

## 2019-11-10 ENCOUNTER — Ambulatory Visit
Admission: RE | Admit: 2019-11-10 | Discharge: 2019-11-10 | Disposition: A | Discharge status: Home / Self Care | Payer: Medicare Other | Attending: Vascular Surgery | Admitting: Vascular Surgery

## 2019-11-10 ENCOUNTER — Encounter
Admission: RE | Disposition: A | Discharge status: Home / Self Care | Payer: Self-pay | Source: Home / Self Care | Attending: Vascular Surgery

## 2019-11-10 ENCOUNTER — Other Ambulatory Visit: Payer: Self-pay | Admitting: *Deleted

## 2019-11-10 ENCOUNTER — Ambulatory Visit: Payer: Medicare Other | Admitting: Pulmonary Disease

## 2019-11-10 DIAGNOSIS — C3490 Malignant neoplasm of unspecified part of unspecified bronchus or lung: Secondary | ICD-10-CM | POA: Diagnosis not present

## 2019-11-10 DIAGNOSIS — Z510 Encounter for antineoplastic radiation therapy: Secondary | ICD-10-CM | POA: Diagnosis not present

## 2019-11-10 HISTORY — PX: PORTA CATH INSERTION: CATH118285

## 2019-11-10 SURGERY — PORTA CATH INSERTION
Anesthesia: Moderate Sedation

## 2019-11-10 MED ORDER — SODIUM CHLORIDE 0.9 % IV SOLN
Freq: Once | INTRAVENOUS | Status: AC
  Administered 2019-11-10: 12:00:00
  Filled 2019-11-10: qty 80

## 2019-11-10 MED ORDER — DIPHENHYDRAMINE HCL 50 MG/ML IJ SOLN
INTRAMUSCULAR | Status: AC
  Filled 2019-11-10: qty 1

## 2019-11-10 MED ORDER — MIDAZOLAM HCL 2 MG/ML PO SYRP: 8.0000 mg | ORAL_SOLUTION | Freq: Once | ORAL | Status: DC | PRN

## 2019-11-10 MED ORDER — DIPHENHYDRAMINE HCL 50 MG/ML IJ SOLN: 50.0000 mg | Freq: Once | INTRAMUSCULAR | Status: DC | PRN

## 2019-11-10 MED ORDER — LORAZEPAM 0.5 MG PO TABS: 0.5000 mg | ORAL_TABLET | Freq: Four times a day (QID) | ORAL | 0 refills | Status: DC | PRN

## 2019-11-10 MED ORDER — ONDANSETRON HCL 4 MG/2ML IJ SOLN: 4.0000 mg | Freq: Four times a day (QID) | INTRAMUSCULAR | Status: DC | PRN

## 2019-11-10 MED ORDER — MIDAZOLAM HCL 2 MG/2ML IJ SOLN
INTRAMUSCULAR | Status: DC | PRN
  Administered 2019-11-10: 1 mg via INTRAVENOUS
  Administered 2019-11-10: 2 mg via INTRAVENOUS

## 2019-11-10 MED ORDER — MIDAZOLAM HCL 5 MG/5ML IJ SOLN
INTRAMUSCULAR | Status: AC
  Filled 2019-11-10: qty 5

## 2019-11-10 MED ORDER — METHYLPREDNISOLONE SODIUM SUCC 125 MG IJ SOLR: 125.0000 mg | Freq: Once | INTRAMUSCULAR | Status: DC | PRN

## 2019-11-10 MED ORDER — CEFAZOLIN SODIUM-DEXTROSE 2-4 GM/100ML-% IV SOLN
2.0000 g | Freq: Once | INTRAVENOUS | Status: AC
  Administered 2019-11-10: 11:00:00 2 g via INTRAVENOUS

## 2019-11-10 MED ORDER — DIPHENHYDRAMINE HCL 50 MG/ML IJ SOLN
INTRAMUSCULAR | Status: DC | PRN
  Administered 2019-11-10 (×2): 25 mg via INTRAVENOUS

## 2019-11-10 MED ORDER — FENTANYL CITRATE (PF) 100 MCG/2ML IJ SOLN
INTRAMUSCULAR | Status: AC
  Filled 2019-11-10: qty 2

## 2019-11-10 MED ORDER — SODIUM CHLORIDE 0.9 % IV SOLN: INTRAVENOUS | Status: DC

## 2019-11-10 MED ORDER — CEFAZOLIN SODIUM-DEXTROSE 2-4 GM/100ML-% IV SOLN
INTRAVENOUS | Status: AC
  Administered 2019-11-10: 11:00:00 2 g via INTRAVENOUS
  Filled 2019-11-10: qty 100

## 2019-11-10 MED ORDER — FAMOTIDINE 20 MG PO TABS: 40.0000 mg | ORAL_TABLET | Freq: Once | ORAL | Status: DC | PRN

## 2019-11-10 MED ORDER — HYDROMORPHONE HCL 1 MG/ML IJ SOLN: 1.0000 mg | Freq: Once | INTRAMUSCULAR | Status: DC | PRN

## 2019-11-10 MED ORDER — FENTANYL CITRATE (PF) 100 MCG/2ML IJ SOLN
INTRAMUSCULAR | Status: DC | PRN
  Administered 2019-11-10: 50 ug via INTRAVENOUS
  Administered 2019-11-10: 25 ug via INTRAVENOUS

## 2019-11-10 SURGICAL SUPPLY — 11 items
DERMABOND ADVANCED (GAUZE/BANDAGES/DRESSINGS) ×2
DERMABOND ADVANCED .7 DNX12 (GAUZE/BANDAGES/DRESSINGS) ×1 IMPLANT
ELECT REM PT RETURN 9FT ADLT (ELECTROSURGICAL) ×3
ELECTRODE REM PT RTRN 9FT ADLT (ELECTROSURGICAL) ×1 IMPLANT
KIT PORT POWER 8FR ISP CVUE (Port) ×3 IMPLANT
PACK ANGIOGRAPHY (CUSTOM PROCEDURE TRAY) ×3 IMPLANT
PENCIL ELECTRO HAND CTR (MISCELLANEOUS) ×3 IMPLANT
SUT MNCRL AB 4-0 PS2 18 (SUTURE) ×3 IMPLANT
SUT PROLENE 0 CT 1 30 (SUTURE) ×3 IMPLANT
SUT VIC AB 3-0 SH 27 (SUTURE) ×2
SUT VIC AB 3-0 SH 27X BRD (SUTURE) ×1 IMPLANT

## 2019-11-10 NOTE — Patient Instructions (Signed)
Paclitaxel injection What is this medicine? PACLITAXEL (PAK li TAX el) is a chemotherapy drug. It targets fast dividing cells, like cancer cells, and causes these cells to die. This medicine is used to treat ovarian cancer, breast cancer, lung cancer, Kaposi's sarcoma, and other cancers. This medicine may be used for other purposes; ask your health care provider or pharmacist if you have questions. COMMON BRAND NAME(S): Onxol, Taxol What should I tell my health care provider before I take this medicine? They need to know if you have any of these conditions:  history of irregular heartbeat  liver disease  low blood counts, like low white cell, platelet, or red cell counts  lung or breathing disease, like asthma  tingling of the fingers or toes, or other nerve disorder  an unusual or allergic reaction to paclitaxel, alcohol, polyoxyethylated castor oil, other chemotherapy, other medicines, foods, dyes, or preservatives  pregnant or trying to get pregnant  breast-feeding How should I use this medicine? This drug is given as an infusion into a vein. It is administered in a hospital or clinic by a specially trained health care professional. Talk to your pediatrician regarding the use of this medicine in children. Special care may be needed. Overdosage: If you think you have taken too much of this medicine contact a poison control center or emergency room at once. NOTE: This medicine is only for you. Do not share this medicine with others. What if I miss a dose? It is important not to miss your dose. Call your doctor or health care professional if you are unable to keep an appointment. What may interact with this medicine? Do not take this medicine with any of the following medications:  disulfiram  metronidazole This medicine may also interact with the following medications:  antiviral medicines for hepatitis, HIV or AIDS  certain antibiotics like erythromycin and  clarithromycin  certain medicines for fungal infections like ketoconazole and itraconazole  certain medicines for seizures like carbamazepine, phenobarbital, phenytoin  gemfibrozil  nefazodone  rifampin  St. John's wort This list may not describe all possible interactions. Give your health care provider a list of all the medicines, herbs, non-prescription drugs, or dietary supplements you use. Also tell them if you smoke, drink alcohol, or use illegal drugs. Some items may interact with your medicine. What should I watch for while using this medicine? Your condition will be monitored carefully while you are receiving this medicine. You will need important blood work done while you are taking this medicine. This medicine can cause serious allergic reactions. To reduce your risk you will need to take other medicine(s) before treatment with this medicine. If you experience allergic reactions like skin rash, itching or hives, swelling of the face, lips, or tongue, tell your doctor or health care professional right away. In some cases, you may be given additional medicines to help with side effects. Follow all directions for their use. This drug may make you feel generally unwell. This is not uncommon, as chemotherapy can affect healthy cells as well as cancer cells. Report any side effects. Continue your course of treatment even though you feel ill unless your doctor tells you to stop. Call your doctor or health care professional for advice if you get a fever, chills or sore throat, or other symptoms of a cold or flu. Do not treat yourself. This drug decreases your body's ability to fight infections. Try to avoid being around people who are sick. This medicine may increase your risk to bruise  or bleed. Call your doctor or health care professional if you notice any unusual bleeding. Be careful brushing and flossing your teeth or using a toothpick because you may get an infection or bleed more easily.  If you have any dental work done, tell your dentist you are receiving this medicine. Avoid taking products that contain aspirin, acetaminophen, ibuprofen, naproxen, or ketoprofen unless instructed by your doctor. These medicines may hide a fever. Do not become pregnant while taking this medicine. Women should inform their doctor if they wish to become pregnant or think they might be pregnant. There is a potential for serious side effects to an unborn child. Talk to your health care professional or pharmacist for more information. Do not breast-feed an infant while taking this medicine. Men are advised not to father a child while receiving this medicine. This product may contain alcohol. Ask your pharmacist or healthcare provider if this medicine contains alcohol. Be sure to tell all healthcare providers you are taking this medicine. Certain medicines, like metronidazole and disulfiram, can cause an unpleasant reaction when taken with alcohol. The reaction includes flushing, headache, nausea, vomiting, sweating, and increased thirst. The reaction can last from 30 minutes to several hours. What side effects may I notice from receiving this medicine? Side effects that you should report to your doctor or health care professional as soon as possible:  allergic reactions like skin rash, itching or hives, swelling of the face, lips, or tongue  breathing problems  changes in vision  fast, irregular heartbeat  high or low blood pressure  mouth sores  pain, tingling, numbness in the hands or feet  signs of decreased platelets or bleeding - bruising, pinpoint red spots on the skin, black, tarry stools, blood in the urine  signs of decreased red blood cells - unusually weak or tired, feeling faint or lightheaded, falls  signs of infection - fever or chills, cough, sore throat, pain or difficulty passing urine  signs and symptoms of liver injury like dark yellow or brown urine; general ill feeling or  flu-like symptoms; light-colored stools; loss of appetite; nausea; right upper belly pain; unusually weak or tired; yellowing of the eyes or skin  swelling of the ankles, feet, hands  unusually slow heartbeat Side effects that usually do not require medical attention (report to your doctor or health care professional if they continue or are bothersome):  diarrhea  hair loss  loss of appetite  muscle or joint pain  nausea, vomiting  pain, redness, or irritation at site where injected  tiredness This list may not describe all possible side effects. Call your doctor for medical advice about side effects. You may report side effects to FDA at 1-800-FDA-1088. Where should I keep my medicine? This drug is given in a hospital or clinic and will not be stored at home. NOTE: This sheet is a summary. It may not cover all possible information. If you have questions about this medicine, talk to your doctor, pharmacist, or health care provider.  2020 Elsevier/Gold Standard (2017-08-14 13:14:55) Carboplatin injection What is this medicine? CARBOPLATIN (KAR boe pla tin) is a chemotherapy drug. It targets fast dividing cells, like cancer cells, and causes these cells to die. This medicine is used to treat ovarian cancer and many other cancers. This medicine may be used for other purposes; ask your health care provider or pharmacist if you have questions. COMMON BRAND NAME(S): Paraplatin What should I tell my health care provider before I take this medicine? They need to  know if you have any of these conditions:  blood disorders  hearing problems  kidney disease  recent or ongoing radiation therapy  an unusual or allergic reaction to carboplatin, cisplatin, other chemotherapy, other medicines, foods, dyes, or preservatives  pregnant or trying to get pregnant  breast-feeding How should I use this medicine? This drug is usually given as an infusion into a vein. It is administered in a  hospital or clinic by a specially trained health care professional. Talk to your pediatrician regarding the use of this medicine in children. Special care may be needed. Overdosage: If you think you have taken too much of this medicine contact a poison control center or emergency room at once. NOTE: This medicine is only for you. Do not share this medicine with others. What if I miss a dose? It is important not to miss a dose. Call your doctor or health care professional if you are unable to keep an appointment. What may interact with this medicine?  medicines for seizures  medicines to increase blood counts like filgrastim, pegfilgrastim, sargramostim  some antibiotics like amikacin, gentamicin, neomycin, streptomycin, tobramycin  vaccines Talk to your doctor or health care professional before taking any of these medicines:  acetaminophen  aspirin  ibuprofen  ketoprofen  naproxen This list may not describe all possible interactions. Give your health care provider a list of all the medicines, herbs, non-prescription drugs, or dietary supplements you use. Also tell them if you smoke, drink alcohol, or use illegal drugs. Some items may interact with your medicine. What should I watch for while using this medicine? Your condition will be monitored carefully while you are receiving this medicine. You will need important blood work done while you are taking this medicine. This drug may make you feel generally unwell. This is not uncommon, as chemotherapy can affect healthy cells as well as cancer cells. Report any side effects. Continue your course of treatment even though you feel ill unless your doctor tells you to stop. In some cases, you may be given additional medicines to help with side effects. Follow all directions for their use. Call your doctor or health care professional for advice if you get a fever, chills or sore throat, or other symptoms of a cold or flu. Do not treat  yourself. This drug decreases your body's ability to fight infections. Try to avoid being around people who are sick. This medicine may increase your risk to bruise or bleed. Call your doctor or health care professional if you notice any unusual bleeding. Be careful brushing and flossing your teeth or using a toothpick because you may get an infection or bleed more easily. If you have any dental work done, tell your dentist you are receiving this medicine. Avoid taking products that contain aspirin, acetaminophen, ibuprofen, naproxen, or ketoprofen unless instructed by your doctor. These medicines may hide a fever. Do not become pregnant while taking this medicine. Women should inform their doctor if they wish to become pregnant or think they might be pregnant. There is a potential for serious side effects to an unborn child. Talk to your health care professional or pharmacist for more information. Do not breast-feed an infant while taking this medicine. What side effects may I notice from receiving this medicine? Side effects that you should report to your doctor or health care professional as soon as possible:  allergic reactions like skin rash, itching or hives, swelling of the face, lips, or tongue  signs of infection - fever or  chills, cough, sore throat, pain or difficulty passing urine  signs of decreased platelets or bleeding - bruising, pinpoint red spots on the skin, black, tarry stools, nosebleeds  signs of decreased red blood cells - unusually weak or tired, fainting spells, lightheadedness  breathing problems  changes in hearing  changes in vision  chest pain  high blood pressure  low blood counts - This drug may decrease the number of white blood cells, red blood cells and platelets. You may be at increased risk for infections and bleeding.  nausea and vomiting  pain, swelling, redness or irritation at the injection site  pain, tingling, numbness in the hands or  feet  problems with balance, talking, walking  trouble passing urine or change in the amount of urine Side effects that usually do not require medical attention (report to your doctor or health care professional if they continue or are bothersome):  hair loss  loss of appetite  metallic taste in the mouth or changes in taste This list may not describe all possible side effects. Call your doctor for medical advice about side effects. You may report side effects to FDA at 1-800-FDA-1088. Where should I keep my medicine? This drug is given in a hospital or clinic and will not be stored at home. NOTE: This sheet is a summary. It may not cover all possible information. If you have questions about this medicine, talk to your doctor, pharmacist, or health care provider.  2020 Elsevier/Gold Standard (2008-03-17 14:38:05)

## 2019-11-10 NOTE — H&P (Signed)
[facility] History & Physical Update  The patient was interviewed and re-examined.  The patient's previous History and Physical has been reviewed and is unchanged.  There is no change in the plan of care. We plan to proceed with the scheduled procedure.  Leotis Pain, MD  11/10/2019, 9:59 AM

## 2019-11-10 NOTE — Op Note (Signed)
      [facility]       Operative Note  Date: 11/10/2019  Preoperative diagnosis:  1. Lung Cancer  Postoperative diagnosis:  Same as above  Procedures: #1. Ultrasound guidance for vascular access to the right internal jugular vein. #2. Fluoroscopic guidance for placement of catheter. #3. Placement of CT compatible Port-A-Cath, right internal jugular vein.  Surgeon: Leotis Pain, MD.   Anesthesia: Local with moderate conscious sedation for approximately 20  minutes using 3 mg of Versed and 75 mcg of Fentanyl  Fluoroscopy time: less than 1 minute  Contrast used: 0  Estimated blood loss: 3 cc  Indication for the procedure:  The patient is a 68 y.o.male with lung cancer.  The patient needs a Port-A-Cath for durable venous access, chemotherapy, lab draws, and CT scans. We are asked to place this. Risks and benefits were discussed and informed consent was obtained.  Description of procedure: The patient was brought to the vascular and interventional radiology suite.  Moderate conscious sedation was administered throughout the procedure during a face to face encounter with the patient with my supervision of the RN administering medicines and monitoring the patient's vital signs, pulse oximetry, telemetry and mental status throughout from the start of the procedure until the patient was taken to the recovery room. The right neck chest and shoulder were sterilely prepped and draped, and a sterile surgical field was created. Ultrasound was used to help visualize a patent right internal jugular vein. This was then accessed under direct ultrasound guidance without difficulty with the Seldinger needle and a permanent image was recorded. A J-wire was placed. After skin nick and dilatation, the peel-away sheath was then placed over the wire. I then anesthetized an area under the clavicle approximately 1-2 fingerbreadths. A transverse incision was created and an inferior pocket was  created with electrocautery and blunt dissection. The port was then brought onto the field, placed into the pocket and secured to the chest wall with 2 Prolene sutures. The catheter was connected to the port and tunneled from the subclavicular incision to the access site. Fluoroscopic guidance was then used to cut the catheter to an appropriate length. The catheter was then placed through the peel-away sheath and the peel-away sheath was removed. The catheter tip was parked in excellent location under fluorocoscopic guidance in the SVC just above the right atrium. The pocket was then irrigated with antibiotic impregnated saline and the wound was closed with a running 3-0 Vicryl and a 4-0 Monocryl. The access incision was closed with a single 4-0 Monocryl. The Huber needle was used to withdraw blood and flush the port with heparinized saline. Dermabond was then placed as a dressing. The patient tolerated the procedure well and was taken to the recovery room in stable condition.   Leotis Pain 11/10/2019 11:51 AM   This note was created with Dragon Medical transcription system. Any errors in dictation are purely unintentional.

## 2019-11-11 ENCOUNTER — Other Ambulatory Visit: Payer: Self-pay

## 2019-11-11 ENCOUNTER — Encounter: Payer: Self-pay | Admitting: *Deleted

## 2019-11-11 ENCOUNTER — Inpatient Hospital Stay: Payer: Medicare Other

## 2019-11-11 ENCOUNTER — Inpatient Hospital Stay (HOSPITAL_BASED_OUTPATIENT_CLINIC_OR_DEPARTMENT_OTHER): Payer: Medicare Other | Admitting: Oncology

## 2019-11-11 DIAGNOSIS — C3412 Malignant neoplasm of upper lobe, left bronchus or lung: Secondary | ICD-10-CM

## 2019-11-11 DIAGNOSIS — Z5111 Encounter for antineoplastic chemotherapy: Secondary | ICD-10-CM | POA: Diagnosis not present

## 2019-11-11 DIAGNOSIS — I2510 Atherosclerotic heart disease of native coronary artery without angina pectoris: Secondary | ICD-10-CM

## 2019-11-11 MED ORDER — SERTRALINE HCL 25 MG PO TABS: 25.0000 mg | ORAL_TABLET | Freq: Every day | ORAL | 0 refills | Status: DC

## 2019-11-11 NOTE — Addendum Note (Signed)
Addended by: Faythe Casa E on: 11/11/2019 11:45 AM   Modules accepted: Orders

## 2019-11-11 NOTE — Progress Notes (Addendum)
[place]  [phone] [phone]  Patient Care Team: Center, [facility] as PCP - General ([place]) Telford Nab, RN as Registered Nurse   Name of the patient: Ojas Coone  [id number]  01/06/1951   Date of visit: 11/11/19  Diagnosis-stage IIIc small cell lung cancer  Chief complaint/Reason for visit- Initial Meeting for [facility], preparing for starting chemotherapy  Heme/Onc history:  Oncology History  Malignant neoplasm of upper lobe of left lung ([place])  11/04/2019 Cancer Staging   Staging form: Lung, AJCC 8th Edition - Clinical stage from 11/04/2019: Stage IIIC (cT3, cN3, cM0) - Signed by Sindy Guadeloupe, MD on 11/06/2019   11/06/2019 Initial Diagnosis   Malignant neoplasm of upper lobe of left lung ([place])   11/17/2019 -  Chemotherapy   The patient had palonosetron (ALOXI) injection 0.25 mg, 0.25 mg, Intravenous,  Once, 0 of 4 cycles CARBOplatin (PARAPLATIN) in sodium chloride 0.9 % 100 mL chemo infusion, , Intravenous,  Once, 0 of 4 cycles PACLitaxel (TAXOL) 108 mg in sodium chloride 0.9 % 250 mL chemo infusion (</= 82m/m2), 45 mg/m2, Intravenous,  Once, 0 of 4 cycles  for chemotherapy treatment.      Interval history-Mr. DHayden Kiharais a 68year old male who presents to chemo care clinic today for initial meeting in preparation for starting chemotherapy. I introduced the chemo care clinic and we discussed that the role of the clinic is to assist those who are at an increased risk of emergency room visits and/or complications during the course of chemotherapy treatment. We discussed that the increased risk takes into account factors such as age, performance status, and co-morbidities. We also discussed that for some, this might include barriers to care such as not having a primary care provider, lack of insurance/transportation, or not being able to afford  medications. We discussed that the goal of the program is to help prevent unplanned ER visits and help reduce complications during chemotherapy. We do this by discussing specific risk factors to each individual and identifying ways that we can help improve these risk factors and reduce barriers to care.   ECOG FS:0 - Asymptomatic  Review of systems- Review of Systems  Constitutional: Positive for malaise/fatigue. Negative for chills, fever and weight loss.  HENT: Negative for congestion, ear pain and tinnitus.   Eyes: Negative.  Negative for blurred vision and double vision.  Respiratory: Positive for shortness of breath. Negative for cough and sputum production.   Cardiovascular: Negative.  Negative for chest pain, palpitations and leg swelling.  Gastrointestinal: Negative.  Negative for abdominal pain, constipation, diarrhea, nausea and vomiting.  Genitourinary: Negative for dysuria, frequency and urgency.  Musculoskeletal: Negative for back pain and falls.  Skin: Negative.  Negative for rash.  Neurological: Negative.  Negative for weakness and headaches.  Endo/Heme/Allergies: Negative.  Does not bruise/bleed easily.  Psychiatric/Behavioral: Negative for depression. The patient is nervous/anxious. The patient does not have insomnia.      Current treatment- Carbo/taxol scheduled to start on 11/17/19  No Known Allergies  Past Medical History:  Diagnosis Date   Anxiety    Arthritis    Atrial flutter ([place]    a. diagnosed 5/19; b. s/p TEE/DCCV 05/2018 by Dr. KHumphrey Rolls c. CHADS2VASc => 4 (HTN, age x1, DM, vascular disease)   Complication of anesthesia    "wakes up too early"   Coronary artery disease    a. [place]  [place] and radial arery used - no further details in Care Everywhere); b. LHC 8/19 underlying multi-V dz, patent LIMA-LAD, occluded VG-PDA, 90-95% stenosis of LCx s/p PCI/DES x 2   Cough    Depression    Diabetes mellitus with complication (HCC)     Dyspnea    Dysrhythmia    atrial fib.   Essential hypertension    Family history of premature CAD    a. father passed away from an MI at 69   GERD (gastroesophageal reflux disease)    Heart murmur    HLD (hyperlipidemia)    a. statin intolerant    Mass of left lung    MI (myocardial infarction) ([place])    2004   Obesity     Past Surgical History:  Procedure Laterality Date   CARDIAC CATHETERIZATION  2004   CARDIAC CATHETERIZATION  2019   x2 stents [place] N/A 06/20/2018   Procedure: CARDIOVERSION;  Surgeon: Dionisio David, MD;  Location: [facility] ORS;  Service: Cardiovascular;  Laterality: N/A;   CORONARY ARTERY BYPASS GRAFT     CORONARY STENT INTERVENTION N/A 08/08/2018   Procedure: CORONARY STENT INTERVENTION;  Surgeon: Isaias Cowman, MD;  Location: [place] CV LAB;  Service: Cardiovascular;  Laterality: N/A;   LEFT HEART CATH AND CORONARY ANGIOGRAPHY Left 08/08/2018   Procedure: LEFT HEART CATH AND CORONARY ANGIOGRAPHY;  Surgeon: Dionisio David, MD;  Location: [place] CV LAB;  Service: Cardiovascular;  Laterality: Left;   PORTA CATH INSERTION N/A 11/10/2019   Procedure: PORTA CATH INSERTION;  Surgeon: Algernon Huxley, MD;  Location: [place] CV LAB;  Service: Cardiovascular;  Laterality: N/A;   TEE WITHOUT CARDIOVERSION N/A 06/20/2018   Procedure: TRANSESOPHAGEAL ECHOCARDIOGRAM (TEE);  Surgeon: Dionisio David, MD;  Location: [facility] ORS;  Service: Cardiovascular;  Laterality: N/A;   VIDEO BRONCHOSCOPY N/A 10/29/2019   Procedure: VIDEO BRONCHOSCOPY WITH ENDOBRONCHICAL ULTRASOUND;  Surgeon: Tyler Pita, MD;  Location: [facility] ORS;  Service: Cardiopulmonary;  Laterality: N/A;    Social History   Socioeconomic History   Marital status: Married    Spouse name: Not on file   Number of children: Not on file   Years of education: Not on file   Highest education level: Not on file  Occupational History   Not  on file  Social Needs   Financial resource strain: Not on file   Food insecurity    Worry: Not on file    Inability: Not on file   Transportation needs    Medical: Not on file    Non-medical: Not on file  Tobacco Use   Smoking status: Former Smoker    Types: Cigarettes    Quit date: 2004    Years since quitting: 16.8   Smokeless tobacco: Current User   Tobacco comment: Quit 2004  Substance and Sexual Activity   Alcohol use: No   Drug use: No   Sexual activity: Not on file  Lifestyle   Physical activity    Days per week: Not on file    Minutes per session: Not on file   Stress: Not on file  Relationships   Social connections    Talks on phone: Not on file    Gets together: Not on file    Attends religious service: Not on file    Active member of club or organization: Not on file    Attends meetings of clubs or organizations: Not on file  Relationship status: Not on file   Intimate partner violence    Fear of current or ex partner: Not on file    Emotionally abused: Not on file    Physically abused: Not on file    Forced sexual activity: Not on file  Other Topics Concern   Not on file  Social History Narrative   Not on file    Family History  Problem Relation Age of Onset   CAD Mother    CAD Father        a. MI at 57-->death   CAD Brother      Current Outpatient Medications:    acetaminophen (TYLENOL) 500 MG tablet, Take 1,000 mg by mouth every 6 (six) hours as needed for moderate pain. , Disp: , Rfl:    aspirin EC 81 MG tablet, Take 1 tablet (81 mg total) by mouth daily., Disp: 90 tablet, Rfl: 3   dexamethasone (DECADRON) 4 MG tablet, Take 2 tablets (8 mg total) by mouth daily. Start the day after chemotherapy for 2 days., Disp: 30 tablet, Rfl: 1   empagliflozin (JARDIANCE) 25 MG TABS tablet, Take 25 mg by mouth daily., Disp: , Rfl:    gabapentin (NEURONTIN) 300 MG capsule, Take 300 mg by mouth at bedtime. , Disp: , Rfl:    glipiZIDE  (GLUCOTROL) 10 MG tablet, Take 10 mg by mouth 2 (two) times daily before a meal., Disp: , Rfl:    Glycopyrrolate-Formoterol (BEVESPI AEROSPHERE) 9-4.8 MCG/ACT AERO, Inhale 2 puffs into the lungs 2 (two) times daily., Disp: 2 g, Rfl: 0   Glycopyrrolate-Formoterol (BEVESPI AEROSPHERE) 9-4.8 MCG/ACT AERO, Inhale 2 puffs into the lungs 2 (two) times daily., Disp: 10.7 g, Rfl: 11   HYDROcodone-homatropine (HYCODAN) 5-1.5 MG/5ML syrup, Take 5 mLs by mouth every 6 (six) hours as needed for cough., Disp: 120 mL, Rfl: 0   lidocaine-prilocaine (EMLA) cream, Apply to affected area once, Disp: 30 g, Rfl: 3   LORazepam (ATIVAN) 0.5 MG tablet, Take 1 tablet (0.5 mg total) by mouth every 6 (six) hours as needed for anxiety., Disp: 30 tablet, Rfl: 0   losartan (COZAAR) 50 MG tablet, Take 50 mg by mouth daily. , Disp: , Rfl:    metoprolol tartrate (LOPRESSOR) 25 MG tablet, Take 1 tablet (25 mg total) by mouth 2 (two) times daily., Disp: 180 tablet, Rfl: 0   Morphine Sulfate (MORPHINE CONCENTRATE) 10 mg / 0.5 ml concentrated solution, Take 0.25 mLs (5 mg total) by mouth every 6 (six) hours as needed for shortness of breath., Disp: 30 mL, Rfl: 0   Omega-3 Fatty Acids (FISH OIL) 1200 MG CAPS, Take 2,400 mg by mouth at bedtime. , Disp: , Rfl:    omeprazole (PRILOSEC) 20 MG capsule, Take 1 capsule by mouth as needed., Disp: , Rfl:    ondansetron (ZOFRAN) 8 MG tablet, Take 1 tablet (8 mg total) by mouth 2 (two) times daily as needed for refractory nausea / vomiting. Start on day 3 after chemo., Disp: 30 tablet, Rfl: 1   prochlorperazine (COMPAZINE) 10 MG tablet, Take 1 tablet (10 mg total) by mouth every 6 (six) hours as needed (Nausea or vomiting)., Disp: 30 tablet, Rfl: 1   rivaroxaban (XARELTO) 20 MG TABS tablet, Take 1 tablet (20 mg total) by mouth daily with supper., Disp: 90 tablet, Rfl: 2  Physical exam: There were no vitals filed for this visit. Physical Exam Constitutional:      Appearance: Normal  appearance.  HENT:     Head: Normocephalic  and atraumatic.  Eyes:     Pupils: Pupils are equal, round, and reactive to light.  Neck:     Musculoskeletal: Normal range of motion.  Cardiovascular:     Rate and Rhythm: Normal rate and regular rhythm.     Heart sounds: Normal heart sounds. No murmur.  Pulmonary:     Effort: Pulmonary effort is normal.     Breath sounds: Normal breath sounds. No wheezing.  Abdominal:     General: Bowel sounds are normal. There is no distension.     Palpations: Abdomen is soft.     Tenderness: There is no abdominal tenderness.  Musculoskeletal: Normal range of motion.  Skin:    General: Skin is warm and dry.     Findings: No rash.  Neurological:     Mental Status: He is alert and oriented to person, place, and time.  Psychiatric:        Judgment: Judgment normal.      CMP Latest Ref Rng & Units 10/09/2019  Glucose 70 - 99 mg/dL 144(H)  BUN 8 - 23 mg/dL 16  Creatinine 0.61 - 1.24 mg/dL 0.65  Sodium 135 - 145 mmol/L 137  Potassium 3.5 - 5.1 mmol/L 3.4(L)  [place] 98 - 111 mmol/L 103  CO2 22 - 32 mmol/L 20(L)  Calcium 8.9 - 10.3 mg/dL 9.6  Total Protein 6.0 - 8.5 g/dL -  Total Bilirubin 0.0 - 1.2 mg/dL -  Alkaline Phos 39 - 117 IU/L -  AST 0 - 40 IU/L -  ALT 0 - 44 IU/L -   CBC Latest Ref Rng & Units 10/09/2019  WBC 4.0 - 10.5 K/uL 8.1  Hemoglobin 13.0 - 17.0 g/dL 15.7  Hematocrit 39.0 - 52.0 % 45.5  Platelets 150 - 400 K/uL 339    No images are attached to the encounter.  Mr Jeri Cos Wo Contrast  Result Date: 10/22/2019 CLINICAL DATA:  Lung mass. Additional history provided: Dizziness and deteriorating eyesight. EXAM: MRI HEAD WITHOUT AND WITH CONTRAST TECHNIQUE: Multiplanar, multiecho pulse sequences of the brain and surrounding structures were obtained without and with intravenous contrast. CONTRAST:  58m GADAVIST GADOBUTROL 1 MMOL/ML IV SOLN COMPARISON:  Head CT 11/14/2011 FINDINGS: Brain: There is no evidence of acute infarct. No  evidence of intracranial mass. No midline shift or extra-axial fluid collection. No chronic intracranial blood products. Mild scattered T2/FLAIR hyperintensity within the cerebral white matter is nonspecific, but consistent with chronic small vessel ischemic disease. Cerebral volume is normal for age. No abnormal intracranial enhancement is identified. Vascular: Flow voids maintained within the proximal large arterial vessels. Skull and upper cervical spine: No focal marrow lesion Sinuses/Orbits: Visualized orbits demonstrate no acute abnormality. Trace ethmoid sinus mucosal thickening. No significant mastoid effusion. IMPRESSION: 1. No evidence of acute intracranial abnormality or intracranial metastatic disease. 2. Mild chronic small vessel ischemic changes within the cerebral white matter. Electronically Signed   By: KKellie SimmeringDO   On: 10/22/2019 23:24   Nm Pet Image Initial (pi) Skull Base To Thigh  Result Date: 10/14/2019 CLINICAL DATA:  Initial treatment strategy for left upper lobe mass. EXAM: NUCLEAR MEDICINE PET SKULL BASE TO THIGH TECHNIQUE: 13.6 mCi F-18 FDG was injected intravenously. Full-ring PET imaging was performed from the skull base to thigh after the radiotracer. CT data was obtained and used for attenuation correction and anatomic localization. Fasting blood glucose: 156 mg/dl COMPARISON:  CT chest 10/09/2019 FINDINGS: Mediastinal blood pool activity: SUV max 2.3 Liver activity: SUV max NA NECK: No  significant abnormal hypermetabolic activity in this region. Incidental CT findings: none CHEST: Spiculated left upper lobe central mass has a maximum SUV of 10.1 and measures approximately 6.3 by 3.4 cm. This mass has spiculated margins postobstructive atelectasis. There is associated left supraclavicular, prevascular, right and left paratracheal, AP window, subcarinal, left hilar, and left infrahilar adenopathy. Index left supraclavicular node 1.7 cm in short axis on image 69/3, maximum SUV  7.6. Index right paratracheal nodal conglomerate 2.8 cm in short axis on image 93/3, maximum SUV 10.1. Subcarinal node 1.3 cm in short axis on image 107/3, maximum SUV 10.0. Incidental CT findings: Coronary, aortic arch, and branch vessel atherosclerotic vascular disease. Prior CABG. ABDOMEN/PELVIS: A partially exophytic lesion of the right kidney upper pole measuring 2.9 by 3.4 cm on image 179/3 has a suggestion of associated hypermetabolic activity which would be highly unusual for cyst, raising concern for a solid renal mass such as renal cell carcinoma. SUV in this lesion up to 3.5. Incidental CT findings: Aortoiliac atherosclerotic vascular disease. SKELETON: No significant abnormal hypermetabolic activity in this region. Incidental CT findings: none IMPRESSION: 1. The spiculated left upper lobe mass has a maximum SUV of 10.1 compatible with malignancy. There is associated left supraclavicular, ipsilateral and contralateral mediastinal, left hilar, left infrahilar, and subcarinal hypermetabolic adenopathy compatible with nodal spread. No findings of additional involvement of the neck, abdomen/pelvis, or skeleton. 2. A 3.4 cm partially exophytic lesion of the right kidney upper pole appears to have metabolic activity, making it concerning for a independent mass such as renal cell carcinoma. Options for workup may include renal ultrasound or renal protocol MRI with and without contrast. 3. Aortic Atherosclerosis (ICD10-I70.0).  Coronary atherosclerosis. Electronically Signed   By: Van Clines M.D.   On: 10/14/2019 14:56     Assessment and plan- Patient is a 68 y.o. male who presents to [facility] for initial meeting in preparation for starting chemotherapy for the treatment of stage III small cell lung cancer.    1. Cancer-stage III small cell lung cancer.  Patient initially presented to ER with symptoms of cough and shortness of breath prompting imaging including chest x-ray and CT scan.  CT  scan showed 3.4 x 3.2 cm left perihilar mass along the left paratracheal precorneal and subcarinal vascular and left hilar adenopathy.  PET scan showed a large left upper lobe mass measuring 6.3 x 3.4 cm.  Bilateral mediastinal adenopathy as well left supraclavicular adenopathy.  3.4 cm partially exophytic lesion of the right kidney.  No evidence of distant metastasis.  Recently discussed pathology results revealing squamous cell carcinoma based on recent bronchoscopy.  MRI brain negative for metastasis.  It was recommended he begin carbotaxol with radiation.  2. Chemo Care Clinic/High Risk for ER/Hospitalization during chemotherapy- We discussed the role of the chemo care clinic and identified patient specific risk factors. I discussed that patient was identified as high risk primarily based on stage of disease and comorbidities.  Patient has PCP at [facility].  He is covered by Medicare part and B with supplemental coverage through [place].  He has past medical history positive for: Past Medical History:  Diagnosis Date   Anxiety    Arthritis    Atrial flutter ([place])    a. diagnosed 5/19; b. s/p TEE/DCCV 05/2018 by Dr. Humphrey Rolls; c. CHADS2VASc => 4 (HTN, age x1, DM, vascular disease)   Complication of anesthesia    "wakes up too early"   Coronary artery disease    a.  s/p 3-v cabg in 2004 at [facility] (LIMA and radial arery used - no further details in Care Everywhere); b. LHC 8/19 underlying multi-V dz, patent LIMA-LAD, occluded VG-PDA, 90-95% stenosis of LCx s/p PCI/DES x 2   Cough    Depression    Diabetes mellitus with complication (HCC)    Dyspnea    Dysrhythmia    atrial fib.   Essential hypertension    Family history of premature CAD    a. father passed away from an MI at 58   GERD (gastroesophageal reflux disease)    Heart murmur    HLD (hyperlipidemia)    a. statin intolerant    Mass of left lung    MI (myocardial infarction) ([place])    2004   Obesity      He has past surgical history positive for: Past Surgical History:  Procedure Laterality Date   CARDIAC CATHETERIZATION  2004   CARDIAC CATHETERIZATION  2019   x2 stents [place] N/A 06/20/2018   Procedure: CARDIOVERSION;  Surgeon: Dionisio David, MD;  Location: [facility] ORS;  Service: Cardiovascular;  Laterality: N/A;   CORONARY ARTERY BYPASS GRAFT     CORONARY STENT INTERVENTION N/A 08/08/2018   Procedure: CORONARY STENT INTERVENTION;  Surgeon: Isaias Cowman, MD;  Location: [place] CV LAB;  Service: Cardiovascular;  Laterality: N/A;   LEFT HEART CATH AND CORONARY ANGIOGRAPHY Left 08/08/2018   Procedure: LEFT HEART CATH AND CORONARY ANGIOGRAPHY;  Surgeon: Dionisio David, MD;  Location: [place] CV LAB;  Service: Cardiovascular;  Laterality: Left;   PORTA CATH INSERTION N/A 11/10/2019   Procedure: PORTA CATH INSERTION;  Surgeon: Algernon Huxley, MD;  Location: [place] CV LAB;  Service: Cardiovascular;  Laterality: N/A;   TEE WITHOUT CARDIOVERSION N/A 06/20/2018   Procedure: TRANSESOPHAGEAL ECHOCARDIOGRAM (TEE);  Surgeon: Dionisio David, MD;  Location: [facility] ORS;  Service: Cardiovascular;  Laterality: N/A;   VIDEO BRONCHOSCOPY N/A 10/29/2019   Procedure: VIDEO BRONCHOSCOPY WITH ENDOBRONCHICAL ULTRASOUND;  Surgeon: Tyler Pita, MD;  Location: [facility] ORS;  Service: Cardiopulmonary;  Laterality: N/A;    3. Social Determinants of Health- we discussed that social determinants of health may have significant impacts on health and outcomes for cancer patients.  Today we discussed specific social determinants of performance status, alcohol use, depression, financial needs, food insecurity, housing, interpersonal violence, social connections, stress, tobacco use, and transportation.  After lengthy discussion they are interested in some financial assistance from the cancer center.  We will put him in touch with [facility] to discuss  financial concerns.  I reviewed with the patient and his daughter other areas of assistance including [facility]elf-referral to [facility] for counseling services, psychiatry for medication management, or palliative care/symptom management as well as primary care providers. We discussed that living with cancer can create tremendous financial burden.  We discussed options for assistance. I asked that if assistance is needed in affording medications or paying bills to please let us know so that we can provide assistance. We discussed options for food including social services.  We will also notify [facility] to see if cancer center can provide support.  Patient informed of food pantry at cancer center and was provided with care package today.  Please notify nursing if un-met needs. We discussed referral to social work. Will also discuss with Elease Etienne to see if [place] can provide support of utility bills, etc. We discussed options for support groups at  the cancer center. If interested, please notify nurse navigator to enroll. We discussed options for managing stress including healthy eating, exercise as well as participating in no charge counseling services at the cancer center and support groups.  If these are of interest, patient can notify either myself or primary nursing team. We discussed options for transportation including [employer], paratransit, bus routes, link transit, taxi/uber/lyft, and cancer center van.  I have notified primary oncology team who will help assist with arranging Lucianne Lei transportation for appointments when needed. We also discussed options for transportation on short notice/acute visits.   4. Co-morbidities Complicating Care:  Past Medical History:  Diagnosis Date   Anxiety    Arthritis    Atrial flutter ([place])    a. diagnosed 5/19; b. s/p TEE/DCCV 05/2018 by Dr. Humphrey Rolls; c. CHADS2VASc => 4 (HTN, age x1, DM, vascular disease)   Complication of anesthesia    "wakes up too early"    Coronary artery disease    a. s/p 3-v cabg in 2004 at [place] (LIMA and radial arery used - no further details in Care Everywhere); b. LHC 8/19 underlying multi-V dz, patent LIMA-LAD, occluded VG-PDA, 90-95% stenosis of LCx s/p PCI/DES x 2   Cough    Depression    Diabetes mellitus with complication (HCC)    Dyspnea    Dysrhythmia    atrial fib.   Essential hypertension    Family history of premature CAD    a. father passed away from an MI at 60   GERD (gastroesophageal reflux disease)    Heart murmur    HLD (hyperlipidemia)    a. statin intolerant    Mass of left lung    MI (myocardial infarction) ([place])    2004   Obesity     5. Palliative Care- based on stage of cancer and/or identified needs today, I will refer patient to palliative care for goals of care and advanced care planning.  6. We also discussed the role of the Symptom Management Clinic at [facility] for acute issues and methods of contacting clinic/provider. He denies needing specific assistance at this time and He will be followed by Telford Nab, RN (Nurse Navigator).  7. Anxiety: Currently on Ativan 0.5 mg every 6 hours as needed for anxiety.  Asking if he can increase his dose due to unrelieved anxiety.  Spoke with Dr. Janese Banks and she recommends 25 mg Zoloft daily for maintenance.  Medication prescribed and patient notified.  Visit Diagnosis 1. Malignant neoplasm of upper lobe of left lung [facility])     Patient expressed understanding and was in agreement with this plan. He also understands that He can call clinic at any time with any questions, concerns, or complaints.   A total of (25) minutes of face-to-face time was spent with this patient with greater than 50% of that time in counseling and care-coordination.  Rulon Abide,  NP, AGNP-C [place] at [facility] [phone] (work cell) [phone] (office)  CC: Dr. Janese Banks

## 2019-11-11 NOTE — Telephone Encounter (Signed)
Ok to reschedule urology a month out

## 2019-11-12 ENCOUNTER — Ambulatory Visit: Payer: Medicare Other | Admitting: Urology

## 2019-11-13 ENCOUNTER — Ambulatory Visit
Admission: RE | Admit: 2019-11-13 | Discharge: 2019-11-13 | Disposition: A | Discharge status: Home / Self Care | Payer: Medicare Other | Source: Ambulatory Visit | Attending: Radiation Oncology | Admitting: Radiation Oncology

## 2019-11-13 ENCOUNTER — Other Ambulatory Visit: Payer: Self-pay

## 2019-11-14 ENCOUNTER — Other Ambulatory Visit: Payer: Self-pay

## 2019-11-14 NOTE — Progress Notes (Signed)
Patient pre screened for office appointment, no questions or concerns today. Patient reminded of upcoming appointment time and date. 

## 2019-11-17 ENCOUNTER — Inpatient Hospital Stay: Payer: Medicare Other

## 2019-11-17 ENCOUNTER — Ambulatory Visit
Admission: RE | Admit: 2019-11-17 | Discharge: 2019-11-17 | Disposition: A | Discharge status: Home / Self Care | Payer: Medicare Other | Source: Ambulatory Visit | Attending: Radiation Oncology | Admitting: Radiation Oncology

## 2019-11-17 ENCOUNTER — Inpatient Hospital Stay (HOSPITAL_BASED_OUTPATIENT_CLINIC_OR_DEPARTMENT_OTHER): Payer: Medicare Other | Admitting: Oncology

## 2019-11-17 ENCOUNTER — Encounter: Payer: Self-pay | Admitting: Oncology

## 2019-11-17 ENCOUNTER — Other Ambulatory Visit: Payer: Self-pay

## 2019-11-17 ENCOUNTER — Encounter: Payer: Self-pay | Admitting: *Deleted

## 2019-11-17 VITALS — BP 117/76 | HR 60 | Resp 18

## 2019-11-17 VITALS — BP 129/72 | HR 67 | Temp 96.9°F | Wt 255.0 lb

## 2019-11-17 DIAGNOSIS — N2889 Other specified disorders of kidney and ureter: Secondary | ICD-10-CM

## 2019-11-17 DIAGNOSIS — Z5111 Encounter for antineoplastic chemotherapy: Secondary | ICD-10-CM | POA: Diagnosis not present

## 2019-11-17 DIAGNOSIS — C3412 Malignant neoplasm of upper lobe, left bronchus or lung: Secondary | ICD-10-CM

## 2019-11-17 DIAGNOSIS — Z510 Encounter for antineoplastic radiation therapy: Secondary | ICD-10-CM | POA: Diagnosis not present

## 2019-11-17 DIAGNOSIS — I2510 Atherosclerotic heart disease of native coronary artery without angina pectoris: Secondary | ICD-10-CM

## 2019-11-17 LAB — CBC WITH DIFFERENTIAL/PLATELET
Abs Immature Granulocytes: 0.04 10*3/uL (ref 0.00–0.07)
Basophils Absolute: 0 10*3/uL (ref 0.0–0.1)
Basophils Relative: 1 %
Eosinophils Absolute: 0.2 10*3/uL (ref 0.0–0.5)
Eosinophils Relative: 2 %
HCT: 43.3 % (ref 39.0–52.0)
Hemoglobin: 14.4 g/dL (ref 13.0–17.0)
Immature Granulocytes: 1 %
Lymphocytes Relative: 24 %
Lymphs Abs: 1.9 10*3/uL (ref 0.7–4.0)
MCH: 30.3 pg (ref 26.0–34.0)
MCHC: 33.3 g/dL (ref 30.0–36.0)
MCV: 91 fL (ref 80.0–100.0)
Monocytes Absolute: 0.8 10*3/uL (ref 0.1–1.0)
Monocytes Relative: 10 %
Neutro Abs: 5 10*3/uL (ref 1.7–7.7)
Neutrophils Relative %: 62 %
Platelets: 280 10*3/uL (ref 150–400)
RBC: 4.76 MIL/uL (ref 4.22–5.81)
RDW: 12.9 % (ref 11.5–15.5)
WBC: 8 10*3/uL (ref 4.0–10.5)
nRBC: 0 % (ref 0.0–0.2)

## 2019-11-17 LAB — COMPREHENSIVE METABOLIC PANEL
ALT: 19 U/L (ref 0–44)
AST: 20 U/L (ref 15–41)
Albumin: 4.4 g/dL (ref 3.5–5.0)
Alkaline Phosphatase: 70 U/L (ref 38–126)
Anion gap: 9 (ref 5–15)
BUN: 20 mg/dL (ref 8–23)
CO2: 24 mmol/L (ref 22–32)
Calcium: 9 mg/dL (ref 8.9–10.3)
Chloride: 100 mmol/L (ref 98–111)
Creatinine, Ser: 0.88 mg/dL (ref 0.61–1.24)
GFR calc Af Amer: >60 mL/min (ref >60)
GFR calc non Af Amer: >60 mL/min (ref >60)
Glucose, Bld: 166 mg/dL — ABNORMAL HIGH (ref 70–99)
Potassium: 4.1 mmol/L (ref 3.5–5.1)
Sodium: 133 mmol/L — ABNORMAL LOW (ref 135–145)
Total Bilirubin: 0.5 mg/dL (ref 0.3–1.2)
Total Protein: 7.5 g/dL (ref 6.5–8.1)

## 2019-11-17 MED ORDER — HEPARIN SOD (PORK) LOCK FLUSH 100 UNIT/ML IV SOLN: 500.0000 [IU] | Freq: Once | INTRAVENOUS | Status: DC | PRN

## 2019-11-17 MED ORDER — SODIUM CHLORIDE 0.9 % IV SOLN
Freq: Once | INTRAVENOUS | Status: AC
  Administered 2019-11-17: 10:00:00 via INTRAVENOUS
  Filled 2019-11-17: qty 250

## 2019-11-17 MED ORDER — PALONOSETRON HCL INJECTION 0.25 MG/5ML
0.2500 mg | Freq: Once | INTRAVENOUS | Status: AC
  Administered 2019-11-17: 0.25 mg via INTRAVENOUS
  Filled 2019-11-17: qty 5

## 2019-11-17 MED ORDER — DIPHENHYDRAMINE HCL 50 MG/ML IJ SOLN
50.0000 mg | Freq: Once | INTRAMUSCULAR | Status: AC
  Administered 2019-11-17: 50 mg via INTRAVENOUS
  Filled 2019-11-17: qty 1

## 2019-11-17 MED ORDER — SODIUM CHLORIDE 0.9% FLUSH
10.0000 mL | INTRAVENOUS | Status: DC | PRN
  Administered 2019-11-17: 10 mL via INTRAVENOUS
  Filled 2019-11-17: qty 10

## 2019-11-17 MED ORDER — FAMOTIDINE IN NACL 20-0.9 MG/50ML-% IV SOLN
20.0000 mg | Freq: Once | INTRAVENOUS | Status: AC
  Administered 2019-11-17: 20 mg via INTRAVENOUS
  Filled 2019-11-17: qty 50

## 2019-11-17 MED ORDER — SODIUM CHLORIDE 0.9 % IV SOLN
20.0000 mg | Freq: Once | INTRAVENOUS | Status: AC
  Administered 2019-11-17: 20 mg via INTRAVENOUS
  Filled 2019-11-17: qty 2

## 2019-11-17 MED ORDER — SODIUM CHLORIDE 0.9 % IV SOLN
45.0000 mg/m2 | Freq: Once | INTRAVENOUS | Status: AC
  Administered 2019-11-17: 108 mg via INTRAVENOUS
  Filled 2019-11-17: qty 18

## 2019-11-17 MED ORDER — SODIUM CHLORIDE 0.9 % IV SOLN
276.0000 mg | Freq: Once | INTRAVENOUS | Status: AC
  Administered 2019-11-17: 280 mg via INTRAVENOUS
  Filled 2019-11-17: qty 28

## 2019-11-17 MED ORDER — HEPARIN SOD (PORK) LOCK FLUSH 100 UNIT/ML IV SOLN
500.0000 [IU] | Freq: Once | INTRAVENOUS | Status: AC
  Administered 2019-11-17: 500 [IU] via INTRAVENOUS
  Filled 2019-11-17: qty 5

## 2019-11-17 NOTE — Progress Notes (Signed)
  Oncology Nurse Navigator Documentation  Navigator Location: [facility] (11/17/19 1000)   )Navigator Encounter Type: Treatment (11/17/19 1000)                   Treatment Initiated Date: 11/17/19 (11/17/19 1000) Patient Visit Type: MedOnc (11/17/19 1000) Treatment Phase: First Chemo Tx;First Radiation Tx (11/17/19 1000) Barriers/Navigation Needs: Financial Toxicity (11/17/19 1000)   Interventions: Coordination of Care;Medication Assistance;Referrals (11/17/19 1000) Referrals: Social Work (11/17/19 1000) Coordination of Care: Appts;Radiology (11/17/19 1000)     met with patient prior to starting chemotherapy today. All questions answered during visit. Pt voiced concerns about finances and being able to afford treatment and medications. Referred pt to social work to further discuss meeting those needs. Pt did not have any more questions or needs at this time. Instructed to call with any further questions or needs. Nothing further needed at this time.              Time Spent with Patient: 30 (11/17/19 1000)

## 2019-11-17 NOTE — Progress Notes (Signed)
Hematology/Oncology Consult note [facility]  [phone] [phone]  Patient Care Team: Center, [facility] as PCP - General ([place]) Telford Nab, RN as Registered Nurse   Name of the patient: Derrick Gallegos  [id number]  [birth year]   Date of visit: 11/17/19  Diagnosis- Stage IIIC SCC lung [id number]  Chief complaint/ Reason for visit-on treatment assessment prior to cycle 1 of weekly carbotaxol chemotherapy concurrent with radiation  Heme/Onc history: patient is a 68 year old malewho was a former smoker and smoked about 2 to 3 packs/day for many years and quit smoking a few years ago. He presented to the ER with symptoms of cough and shortness of breathWhich prompted [place] chest x-ray followed by a CT scan. CT scan showed a 3.4 x 3.2 cm left perihilar mass along the left paratracheal precarinal and subcarinal vascular and left hilar adenopathy. Patient has been referred for further management.  PET CT scan showed a large left upper lobe lung mass measuring 6.3 x 3.4 cm. Bilateral mediastinal adenopathy as well as left supraclavicular adenopathy. 3.4 cm partially exophytic lesion of the right kidney. No evidence of distant metastatic disease.  Interval history-patient reports his anxiety is well controlled with as needed Ativan.  Reports chronic low back pain with radiation to his right lower extremity.  Shortness of breath is improved and he takes as needed morphine for the same.  ECOG PS- 1 Pain scale- 0 Opioid associated constipation- no  Review of systems- Review of Systems  Constitutional: Positive for malaise/fatigue. Negative for chills, fever and weight loss.  HENT: Negative for congestion, ear discharge and nosebleeds.   Eyes: Negative for blurred vision.  Respiratory: Positive for shortness of breath. Negative for cough, hemoptysis, sputum production and wheezing.   Cardiovascular: Negative for  chest pain, palpitations, orthopnea and claudication.  Gastrointestinal: Negative for abdominal pain, blood in stool, constipation, diarrhea, heartburn, melena, nausea and vomiting.  Genitourinary: Negative for dysuria, flank pain, frequency, hematuria and urgency.  Musculoskeletal: Positive for back pain. Negative for joint pain and myalgias.  Skin: Negative for rash.  Neurological: Negative for dizziness, tingling, focal weakness, seizures, weakness and headaches.  Endo/Heme/Allergies: Does not bruise/bleed easily.  Psychiatric/Behavioral: Negative for depression and suicidal ideas. The patient is nervous/anxious. The patient does not have insomnia.       No Known Allergies   Past Medical History:  Diagnosis Date   Anxiety    Arthritis    Atrial flutter ([place])    a. diagnosed 5/19; b. s/p TEE/DCCV 05/2018 by Dr. Humphrey Rolls; c. CHADS2VASc => 4 (HTN, age x1, DM, vascular disease)   Complication of anesthesia    "wakes up too early"   Coronary artery disease    a. s/p 3-v cabg in 2004 at [place] (LIMA and radial arery used - no further details in Care Everywhere); b. LHC 8/19 underlying multi-V dz, patent LIMA-LAD, occluded VG-PDA, 90-95% stenosis of LCx s/p PCI/DES x 2   Cough    Depression    Diabetes mellitus with complication (HCC)    Dyspnea    Dysrhythmia    atrial fib.   Essential hypertension    Family history of premature CAD    a. father passed away from an MI at 92   GERD (gastroesophageal reflux disease)    Heart murmur    HLD (hyperlipidemia)    a. statin intolerant    Mass of left lung    MI (myocardial infarction) ([place])    2004  Obesity      Past Surgical History:  Procedure Laterality Date   CARDIAC CATHETERIZATION  2004   CARDIAC CATHETERIZATION  2019   x2 stents [place] N/A 06/20/2018   Procedure: CARDIOVERSION;  Surgeon: Dionisio David, MD;  Location: [facility] ORS;  Service: Cardiovascular;  Laterality: N/A;    CORONARY ARTERY BYPASS GRAFT     CORONARY STENT INTERVENTION N/A 08/08/2018   Procedure: CORONARY STENT INTERVENTION;  Surgeon: Isaias Cowman, MD;  Location: [place] [facility];  Service: Cardiovascular;  Laterality: N/A;   LEFT HEART CATH AND CORONARY ANGIOGRAPHY Left 08/08/2018   Procedure: LEFT HEART CATH AND CORONARY ANGIOGRAPHY;  Surgeon: Dionisio David, MD;  Location: [place] [facility];  Service: Cardiovascular;  Laterality: Left;   PORTA CATH INSERTION N/A 11/10/2019   Procedure: PORTA CATH INSERTION;  Surgeon: Algernon Huxley, MD;  Location: [place] [facility];  Service: Cardiovascular;  Laterality: N/A;   TEE WITHOUT CARDIOVERSION N/A 06/20/2018   Procedure: TRANSESOPHAGEAL ECHOCARDIOGRAM (TEE);  Surgeon: Dionisio David, MD;  Location: [facility] ORS;  Service: Cardiovascular;  Laterality: N/A;   VIDEO BRONCHOSCOPY N/A 10/29/2019   Procedure: VIDEO BRONCHOSCOPY WITH ENDOBRONCHICAL ULTRASOUND;  Surgeon: Tyler Pita, MD;  Location: [facility] ORS;  Service: Cardiopulmonary;  Laterality: N/A;    Social History   Socioeconomic History   Marital status: Married    Spouse name: Not on file   Number of children: Not on file   Years of education: Not on file   Highest education level: Not on file  Occupational History   Not on file  Social Needs   Financial resource strain: Not on file   Food insecurity    Worry: Not on file    Inability: Not on file   Transportation needs    Medical: Not on file    Non-medical: Not on file  Tobacco Use   Smoking status: Former Smoker    Types: Cigarettes    Quit date: 2004    Years since quitting: 16.9   Smokeless tobacco: Current User   Tobacco comment: Quit 2004  Substance and Sexual Activity   Alcohol use: No   Drug use: No   Sexual activity: Not on file  Lifestyle   Physical activity    Days per week: Not on file    Minutes per session: Not on file   Stress: Not on file  Relationships   Social  connections    Talks on phone: Not on file    Gets together: Not on file    Attends religious service: Not on file    Active member of club or organization: Not on file    Attends meetings of clubs or organizations: Not on file    Relationship status: Not on file   Intimate partner violence    Fear of current or ex partner: Not on file    Emotionally abused: Not on file    Physically abused: Not on file    Forced sexual activity: Not on file  Other Topics Concern   Not on file  Social History Narrative   Not on file    Family History  Problem Relation Age of Onset   CAD Mother    CAD Father        a. MI at 57-->death   CAD Brother      Current Outpatient Medications:    acetaminophen (TYLENOL) 500 MG tablet, Take 1,000 mg by mouth  every 6 (six) hours as needed for moderate pain. , Disp: , Rfl:    aspirin EC 81 MG tablet, Take 1 tablet (81 mg total) by mouth daily., Disp: 90 tablet, Rfl: 3   dexamethasone (DECADRON) 4 MG tablet, Take 2 tablets (8 mg total) by mouth daily. Start the day after chemotherapy for 2 days., Disp: 30 tablet, Rfl: 1   empagliflozin (JARDIANCE) 25 MG TABS tablet, Take 25 mg by mouth daily., Disp: , Rfl:    gabapentin (NEURONTIN) 300 MG capsule, Take 300 mg by mouth at bedtime. , Disp: , Rfl:    glipiZIDE (GLUCOTROL) 10 MG tablet, Take 10 mg by mouth 2 (two) times daily before a meal., Disp: , Rfl:    Glycopyrrolate-Formoterol (BEVESPI AEROSPHERE) 9-4.8 MCG/ACT AERO, Inhale 2 puffs into the lungs 2 (two) times daily., Disp: 2 g, Rfl: 0   Glycopyrrolate-Formoterol (BEVESPI AEROSPHERE) 9-4.8 MCG/ACT AERO, Inhale 2 puffs into the lungs 2 (two) times daily., Disp: 10.7 g, Rfl: 11   HYDROcodone-homatropine (HYCODAN) 5-1.5 MG/5ML syrup, Take 5 mLs by mouth every 6 (six) hours as needed for cough., Disp: 120 mL, Rfl: 0   lidocaine-prilocaine (EMLA) cream, Apply to affected area once, Disp: 30 g, Rfl: 3   LORazepam (ATIVAN) 0.5 MG tablet, Take 1  tablet (0.5 mg total) by mouth every 6 (six) hours as needed for anxiety., Disp: 30 tablet, Rfl: 0   losartan (COZAAR) 50 MG tablet, Take 50 mg by mouth daily. , Disp: , Rfl:    metoprolol tartrate (LOPRESSOR) 25 MG tablet, Take 1 tablet (25 mg total) by mouth 2 (two) times daily., Disp: 180 tablet, Rfl: 0   Morphine Sulfate (MORPHINE CONCENTRATE) 10 mg / 0.5 ml concentrated solution, Take 0.25 mLs (5 mg total) by mouth every 6 (six) hours as needed for shortness of breath., Disp: 30 mL, Rfl: 0   Omega-3 Fatty Acids (FISH OIL) 1200 MG CAPS, Take 2,400 mg by mouth at bedtime. , Disp: , Rfl:    omeprazole (PRILOSEC) 20 MG capsule, Take 1 capsule by mouth as needed., Disp: , Rfl:    ondansetron (ZOFRAN) 8 MG tablet, Take 1 tablet (8 mg total) by mouth 2 (two) times daily as needed for refractory nausea / vomiting. Start on day 3 after chemo., Disp: 30 tablet, Rfl: 1   prochlorperazine (COMPAZINE) 10 MG tablet, Take 1 tablet (10 mg total) by mouth every 6 (six) hours as needed (Nausea or vomiting)., Disp: 30 tablet, Rfl: 1   rivaroxaban (XARELTO) 20 MG TABS tablet, Take 1 tablet (20 mg total) by mouth daily with supper., Disp: 90 tablet, Rfl: 2   sertraline (ZOLOFT) 25 MG tablet, Take 1 tablet (25 mg total) by mouth daily., Disp: 90 tablet, Rfl: 0 No current facility-administered medications for this visit.   Facility-Administered Medications Ordered in Other Visits:    heparin lock flush 100 unit/mL, 500 Units, Intracatheter, Once PRN, Sindy Guadeloupe, MD   sodium chloride flush (NS) 0.9 % injection 10 mL, 10 mL, Intravenous, PRN, Sindy Guadeloupe, MD, 10 mL at 11/17/19 0830  Physical exam:  Vitals:   11/17/19 0904 11/17/19 0907  BP:  129/72  Pulse:  67  Temp: (!) 96.9 F (36.1 C) (!) 96.9 F (36.1 C)  TempSrc: Tympanic Tympanic  Weight: 255 lb (115.7 kg) 255 lb (115.7 kg)   Physical Exam Constitutional:      General: He is not in acute distress. HENT:     Head: Normocephalic and  atraumatic.  Eyes:  Pupils: Pupils are equal, round, and reactive to light.  Neck:     Musculoskeletal: Normal range of motion.  Cardiovascular:     Rate and Rhythm: Normal rate and regular rhythm.     Heart sounds: Normal heart sounds.  Pulmonary:     Effort: Pulmonary effort is normal.     Breath sounds: Normal breath sounds.  Abdominal:     General: Bowel sounds are normal.     Palpations: Abdomen is soft.  Skin:    General: Skin is warm and dry.  Neurological:     Mental Status: He is alert and oriented to person, place, and time.      CMP Latest Ref Rng & Units 11/17/2019  Glucose 70 - 99 mg/dL 166(H)  BUN 8 - 23 mg/dL 20  Creatinine 0.61 - 1.24 mg/dL 0.88  Sodium 135 - 145 mmol/L 133(L)  Potassium 3.5 - 5.1 mmol/L 4.1  Chloride 98 - 111 mmol/L 100  CO2 22 - 32 mmol/L 24  Calcium 8.9 - 10.3 mg/dL 9.0  Total Protein 6.5 - 8.1 g/dL 7.5  Total Bilirubin 0.3 - 1.2 mg/dL 0.5  Alkaline Phos 38 - 126 U/L 70  AST 15 - 41 U/L 20  ALT 0 - 44 U/L 19   CBC Latest Ref Rng & Units 11/17/2019  WBC 4.0 - 10.5 K/uL 8.0  Hemoglobin 13.0 - 17.0 g/dL 14.4  Hematocrit 39.0 - 52.0 % 43.3  Platelets 150 - 400 K/uL 280    No images are attached to the encounter.  Mr Derrick Gallegos Wo Contrast  Result Date: 10/22/2019 CLINICAL DATA:  Lung mass. Additional history provided: Dizziness and deteriorating eyesight. EXAM: MRI HEAD WITHOUT AND WITH CONTRAST TECHNIQUE: Multiplanar, multiecho pulse sequences of the brain and surrounding structures were obtained without and with intravenous contrast. CONTRAST:  21mL GADAVIST GADOBUTROL 1 MMOL/ML IV SOLN COMPARISON:  Head CT 11/14/2011 FINDINGS: Brain: There is no evidence of acute infarct. No evidence of intracranial mass. No midline shift or extra-axial fluid collection. No chronic intracranial blood products. Mild scattered T2/FLAIR hyperintensity within the cerebral white matter is nonspecific, but consistent with chronic small vessel ischemic  disease. Cerebral volume is normal for age. No abnormal intracranial enhancement is identified. Vascular: Flow voids maintained within the proximal large arterial vessels. Skull and upper cervical spine: No focal marrow lesion Sinuses/Orbits: Visualized orbits demonstrate no acute abnormality. Trace ethmoid sinus mucosal thickening. No significant mastoid effusion. IMPRESSION: 1. No evidence of acute intracranial abnormality or intracranial metastatic disease. 2. Mild chronic small vessel ischemic changes within the cerebral white matter. Electronically Signed   By: Kellie Simmering DO   On: 10/22/2019 23:24     Assessment and plan- Patient is a 68 y.o. 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  I will see him back in 1 week's time with CBC with differential, CMP for cycle 2.  I will see him every other cycle thereafter.  Right renal mass: Patient wanted to hold off on his urology appointment until he is done with concurrent chemoradiation.  We will plan to get MRI abdomen with and without contrast renal mass protocol to assess the renal mass further in the interim.  Anxiety: Secondary to new diagnosis of lung cancer.  He is on as needed Ativan.  He was also started on  Prozac but he has not started taking this yet.     Visit Diagnosis 1. Right renal mass      [name], MD, MPH [facility] [phone] 11/17/2019 1:48 PM

## 2019-11-17 NOTE — Progress Notes (Signed)
Patient stated that he had been doing well.

## 2019-11-18 ENCOUNTER — Encounter: Payer: Self-pay | Admitting: *Deleted

## 2019-11-18 ENCOUNTER — Telehealth: Payer: Self-pay | Admitting: Internal Medicine

## 2019-11-18 ENCOUNTER — Ambulatory Visit
Admission: RE | Admit: 2019-11-18 | Discharge: 2019-11-18 | Disposition: A | Discharge status: Home / Self Care | Payer: Medicare Other | Source: Ambulatory Visit | Attending: Radiation Oncology | Admitting: Radiation Oncology

## 2019-11-18 ENCOUNTER — Telehealth: Payer: Self-pay

## 2019-11-18 ENCOUNTER — Other Ambulatory Visit: Payer: Self-pay

## 2019-11-18 ENCOUNTER — Telehealth (INDEPENDENT_AMBULATORY_CARE_PROVIDER_SITE_OTHER): Payer: Self-pay

## 2019-11-18 DIAGNOSIS — Z510 Encounter for antineoplastic radiation therapy: Secondary | ICD-10-CM | POA: Diagnosis not present

## 2019-11-18 NOTE — Telephone Encounter (Signed)
Attempted to reach patient. No answer. Left message to call back.    Routing to [employer] to let her know as well.

## 2019-11-18 NOTE — Telephone Encounter (Signed)
I left a message on the nurse voicemail with a detailed message

## 2019-11-18 NOTE — Telephone Encounter (Signed)
Routing to Dr End. 

## 2019-11-18 NOTE — Telephone Encounter (Signed)
Derrick Gallegos from [facility] calling  Patient will be starting chemo  [name] wanted patient to check with cardio to make sure it will be ok to continue blood thinners  Please call patient to discuss

## 2019-11-18 NOTE — Telephone Encounter (Signed)
We haven't ever seen the patient in office as a patient.  We have only placed a Port.  In looking at the patient's notes it looks like decision for him to be on xarelto and aspirin came from his cardiologist Dr. Saunders Revel.  They would need to discuss any changes with him.

## 2019-11-18 NOTE — Telephone Encounter (Signed)
As long as his blood counts do not drop, I think it is fine to continue his current antithrombotic/antiplatelet agents.  However, it would probably be best for Derrick Gallegos to follow-up with [place] at his earliest convenience (4 month f/u was recommended at the time of his last visit in April).  [name], MD [facility] Pager: [phone]

## 2019-11-18 NOTE — Telephone Encounter (Signed)
Telephone call to patient for follow up after receiving first chemotherapy.   No answer but left message stating I was calling to check on him after receiving first chemo.  Encouraged patient to call for any questions or concerns.

## 2019-11-19 ENCOUNTER — Ambulatory Visit
Admission: RE | Admit: 2019-11-19 | Discharge: 2019-11-19 | Disposition: A | Discharge status: Home / Self Care | Payer: Medicare Other | Source: Ambulatory Visit | Attending: Radiation Oncology | Admitting: Radiation Oncology

## 2019-11-19 ENCOUNTER — Other Ambulatory Visit: Payer: Self-pay

## 2019-11-19 DIAGNOSIS — Z510 Encounter for antineoplastic radiation therapy: Secondary | ICD-10-CM | POA: Diagnosis not present

## 2019-11-19 NOTE — Telephone Encounter (Signed)
No answer. Left message to call back.   MyChart message with Dr Darnelle Bos recommendations and to schedule an appointment sent to patient.

## 2019-11-23 ENCOUNTER — Encounter: Payer: Self-pay | Admitting: *Deleted

## 2019-11-24 ENCOUNTER — Other Ambulatory Visit: Payer: Self-pay

## 2019-11-24 ENCOUNTER — Ambulatory Visit
Admission: RE | Admit: 2019-11-24 | Discharge: 2019-11-24 | Disposition: A | Discharge status: Home / Self Care | Payer: Medicare Other | Source: Ambulatory Visit | Attending: Radiation Oncology | Admitting: Radiation Oncology

## 2019-11-24 ENCOUNTER — Encounter: Payer: Self-pay | Admitting: Oncology

## 2019-11-24 DIAGNOSIS — Z510 Encounter for antineoplastic radiation therapy: Secondary | ICD-10-CM | POA: Diagnosis not present

## 2019-11-24 NOTE — Telephone Encounter (Signed)
Patient unable to schedule right now due to chemo and radiation.  He will call back when able to do so.

## 2019-11-24 NOTE — Telephone Encounter (Signed)
Patient has read the [employer]. Routing to scheduling to reach out to patient for f/u up appointment when able.

## 2019-11-24 NOTE — Telephone Encounter (Signed)
Patient would like a refill on his Lorazepam. You wrote his last prescription on 11/10/2019 for # 30.

## 2019-11-25 ENCOUNTER — Ambulatory Visit: Payer: Medicare Other

## 2019-11-25 ENCOUNTER — Encounter: Payer: Self-pay | Admitting: Oncology

## 2019-11-25 ENCOUNTER — Inpatient Hospital Stay: Payer: Medicare Other | Attending: Oncology | Admitting: Oncology

## 2019-11-25 ENCOUNTER — Other Ambulatory Visit: Payer: Medicare Other

## 2019-11-25 ENCOUNTER — Other Ambulatory Visit: Payer: Self-pay

## 2019-11-25 ENCOUNTER — Ambulatory Visit: Payer: Medicare Other | Admitting: Oncology

## 2019-11-25 ENCOUNTER — Other Ambulatory Visit: Payer: Self-pay | Admitting: *Deleted

## 2019-11-25 ENCOUNTER — Ambulatory Visit
Admission: RE | Admit: 2019-11-25 | Discharge: 2019-11-25 | Disposition: A | Discharge status: Home / Self Care | Payer: Medicare Other | Source: Ambulatory Visit | Attending: Radiation Oncology | Admitting: Radiation Oncology

## 2019-11-25 VITALS — BP 132/72 | HR 70 | Temp 96.2°F | Wt 256.0 lb

## 2019-11-25 DIAGNOSIS — I252 Old myocardial infarction: Secondary | ICD-10-CM | POA: Insufficient documentation

## 2019-11-25 DIAGNOSIS — Z7982 Long term (current) use of aspirin: Secondary | ICD-10-CM | POA: Diagnosis not present

## 2019-11-25 DIAGNOSIS — E785 Hyperlipidemia, unspecified: Secondary | ICD-10-CM | POA: Diagnosis not present

## 2019-11-25 DIAGNOSIS — R5381 Other malaise: Secondary | ICD-10-CM | POA: Insufficient documentation

## 2019-11-25 DIAGNOSIS — Y842 Radiological procedure and radiotherapy as the cause of abnormal reaction of the patient, or of later complication, without mention of misadventure at the time of the procedure: Secondary | ICD-10-CM | POA: Insufficient documentation

## 2019-11-25 DIAGNOSIS — F419 Anxiety disorder, unspecified: Secondary | ICD-10-CM

## 2019-11-25 DIAGNOSIS — Z951 Presence of aortocoronary bypass graft: Secondary | ICD-10-CM | POA: Insufficient documentation

## 2019-11-25 DIAGNOSIS — F329 Major depressive disorder, single episode, unspecified: Secondary | ICD-10-CM

## 2019-11-25 DIAGNOSIS — E1159 Type 2 diabetes mellitus with other circulatory complications: Secondary | ICD-10-CM | POA: Insufficient documentation

## 2019-11-25 DIAGNOSIS — Z955 Presence of coronary angioplasty implant and graft: Secondary | ICD-10-CM | POA: Diagnosis not present

## 2019-11-25 DIAGNOSIS — K219 Gastro-esophageal reflux disease without esophagitis: Secondary | ICD-10-CM | POA: Insufficient documentation

## 2019-11-25 DIAGNOSIS — R5383 Other fatigue: Secondary | ICD-10-CM | POA: Diagnosis not present

## 2019-11-25 DIAGNOSIS — Z23 Encounter for immunization: Secondary | ICD-10-CM | POA: Insufficient documentation

## 2019-11-25 DIAGNOSIS — C3412 Malignant neoplasm of upper lobe, left bronchus or lung: Secondary | ICD-10-CM | POA: Insufficient documentation

## 2019-11-25 DIAGNOSIS — M1990 Unspecified osteoarthritis, unspecified site: Secondary | ICD-10-CM | POA: Insufficient documentation

## 2019-11-25 DIAGNOSIS — G893 Neoplasm related pain (acute) (chronic): Secondary | ICD-10-CM | POA: Insufficient documentation

## 2019-11-25 DIAGNOSIS — F32A Depression, unspecified: Secondary | ICD-10-CM

## 2019-11-25 DIAGNOSIS — Z7952 Long term (current) use of systemic steroids: Secondary | ICD-10-CM | POA: Diagnosis not present

## 2019-11-25 DIAGNOSIS — F418 Other specified anxiety disorders: Secondary | ICD-10-CM | POA: Diagnosis not present

## 2019-11-25 DIAGNOSIS — R109 Unspecified abdominal pain: Secondary | ICD-10-CM | POA: Diagnosis not present

## 2019-11-25 DIAGNOSIS — Z79899 Other long term (current) drug therapy: Secondary | ICD-10-CM | POA: Diagnosis not present

## 2019-11-25 DIAGNOSIS — I10 Essential (primary) hypertension: Secondary | ICD-10-CM | POA: Diagnosis not present

## 2019-11-25 DIAGNOSIS — Z5111 Encounter for antineoplastic chemotherapy: Secondary | ICD-10-CM | POA: Diagnosis not present

## 2019-11-25 DIAGNOSIS — Z7901 Long term (current) use of anticoagulants: Secondary | ICD-10-CM | POA: Insufficient documentation

## 2019-11-25 DIAGNOSIS — Z510 Encounter for antineoplastic radiation therapy: Secondary | ICD-10-CM | POA: Diagnosis not present

## 2019-11-25 DIAGNOSIS — R040 Epistaxis: Secondary | ICD-10-CM | POA: Diagnosis not present

## 2019-11-25 DIAGNOSIS — Z87891 Personal history of nicotine dependence: Secondary | ICD-10-CM | POA: Insufficient documentation

## 2019-11-25 DIAGNOSIS — M545 Low back pain: Secondary | ICD-10-CM | POA: Insufficient documentation

## 2019-11-25 DIAGNOSIS — I2510 Atherosclerotic heart disease of native coronary artery without angina pectoris: Secondary | ICD-10-CM | POA: Diagnosis not present

## 2019-11-25 DIAGNOSIS — K208 Other esophagitis without bleeding: Secondary | ICD-10-CM | POA: Insufficient documentation

## 2019-11-25 MED ORDER — MORPHINE SULFATE (CONCENTRATE) 10 MG /0.5 ML PO SOLN: 5.0000 mg | Freq: Four times a day (QID) | ORAL | 0 refills | Status: DC | PRN

## 2019-11-25 MED ORDER — LORAZEPAM 0.5 MG PO TABS: 0.5000 mg | ORAL_TABLET | Freq: Four times a day (QID) | ORAL | 0 refills | Status: DC | PRN

## 2019-11-25 NOTE — Progress Notes (Signed)
Patient stated that he had been feeling tired and weak. Patient stated that he had been feeling dizzy at times. Patient feels very depressed.

## 2019-11-26 ENCOUNTER — Other Ambulatory Visit: Payer: Self-pay

## 2019-11-26 ENCOUNTER — Inpatient Hospital Stay: Payer: Medicare Other

## 2019-11-26 ENCOUNTER — Encounter: Payer: Self-pay | Admitting: *Deleted

## 2019-11-26 ENCOUNTER — Ambulatory Visit
Admission: RE | Admit: 2019-11-26 | Discharge: 2019-11-26 | Disposition: A | Discharge status: Home / Self Care | Payer: Medicare Other | Source: Ambulatory Visit | Attending: Radiation Oncology | Admitting: Radiation Oncology

## 2019-11-26 VITALS — BP 106/73 | HR 74 | Temp 96.0°F | Resp 18 | Wt 256.4 lb

## 2019-11-26 DIAGNOSIS — Z510 Encounter for antineoplastic radiation therapy: Secondary | ICD-10-CM | POA: Diagnosis not present

## 2019-11-26 DIAGNOSIS — Z23 Encounter for immunization: Secondary | ICD-10-CM

## 2019-11-26 DIAGNOSIS — C3412 Malignant neoplasm of upper lobe, left bronchus or lung: Secondary | ICD-10-CM

## 2019-11-26 DIAGNOSIS — Z5111 Encounter for antineoplastic chemotherapy: Secondary | ICD-10-CM | POA: Diagnosis not present

## 2019-11-26 LAB — CBC WITH DIFFERENTIAL/PLATELET
Abs Immature Granulocytes: 0.07 10*3/uL (ref 0.00–0.07)
Basophils Absolute: 0 10*3/uL (ref 0.0–0.1)
Basophils Relative: 0 %
Eosinophils Absolute: 0.1 10*3/uL (ref 0.0–0.5)
Eosinophils Relative: 1 %
HCT: 42 % (ref 39.0–52.0)
Hemoglobin: 13.8 g/dL (ref 13.0–17.0)
Immature Granulocytes: 1 %
Lymphocytes Relative: 15 %
Lymphs Abs: 1 10*3/uL (ref 0.7–4.0)
MCH: 30.1 pg (ref 26.0–34.0)
MCHC: 32.9 g/dL (ref 30.0–36.0)
MCV: 91.7 fL (ref 80.0–100.0)
Monocytes Absolute: 0.8 10*3/uL (ref 0.1–1.0)
Monocytes Relative: 12 %
Neutro Abs: 4.9 10*3/uL (ref 1.7–7.7)
Neutrophils Relative %: 71 %
Platelets: 341 10*3/uL (ref 150–400)
RBC: 4.58 MIL/uL (ref 4.22–5.81)
RDW: 12.9 % (ref 11.5–15.5)
WBC: 6.9 10*3/uL (ref 4.0–10.5)
nRBC: 0 % (ref 0.0–0.2)

## 2019-11-26 LAB — COMPREHENSIVE METABOLIC PANEL
ALT: 15 U/L (ref 0–44)
AST: 16 U/L (ref 15–41)
Albumin: 4 g/dL (ref 3.5–5.0)
Alkaline Phosphatase: 74 U/L (ref 38–126)
Anion gap: 3 — ABNORMAL LOW (ref 5–15)
BUN: 18 mg/dL (ref 8–23)
CO2: 24 mmol/L (ref 22–32)
Calcium: 8.9 mg/dL (ref 8.9–10.3)
Chloride: 101 mmol/L (ref 98–111)
Creatinine, Ser: 0.72 mg/dL (ref 0.61–1.24)
GFR calc Af Amer: >60 mL/min (ref >60)
GFR calc non Af Amer: >60 mL/min (ref >60)
Glucose, Bld: 216 mg/dL — ABNORMAL HIGH (ref 70–99)
Potassium: 3.9 mmol/L (ref 3.5–5.1)
Sodium: 128 mmol/L — ABNORMAL LOW (ref 135–145)
Total Bilirubin: 0.6 mg/dL (ref 0.3–1.2)
Total Protein: 7.3 g/dL (ref 6.5–8.1)

## 2019-11-26 MED ORDER — PALONOSETRON HCL INJECTION 0.25 MG/5ML
0.2500 mg | Freq: Once | INTRAVENOUS | Status: AC
  Administered 2019-11-26: 0.25 mg via INTRAVENOUS
  Filled 2019-11-26: qty 5

## 2019-11-26 MED ORDER — SODIUM CHLORIDE 0.9 % IV SOLN
45.0000 mg/m2 | Freq: Once | INTRAVENOUS | Status: AC
  Administered 2019-11-26: 108 mg via INTRAVENOUS
  Filled 2019-11-26: qty 18

## 2019-11-26 MED ORDER — HEPARIN SOD (PORK) LOCK FLUSH 100 UNIT/ML IV SOLN
500.0000 [IU] | Freq: Once | INTRAVENOUS | Status: AC | PRN
  Administered 2019-11-26: 500 [IU]

## 2019-11-26 MED ORDER — INFLUENZA VAC A&B SA ADJ QUAD 0.5 ML IM PRSY
0.5000 mL | PREFILLED_SYRINGE | Freq: Once | INTRAMUSCULAR | Status: AC
  Administered 2019-11-26: 0.5 mL via INTRAMUSCULAR
  Filled 2019-11-26: qty 0.5

## 2019-11-26 MED ORDER — FAMOTIDINE IN NACL 20-0.9 MG/50ML-% IV SOLN
20.0000 mg | Freq: Once | INTRAVENOUS | Status: AC
  Administered 2019-11-26: 20 mg via INTRAVENOUS
  Filled 2019-11-26: qty 50

## 2019-11-26 MED ORDER — SODIUM CHLORIDE 0.9 % IV SOLN
20.0000 mg | Freq: Once | INTRAVENOUS | Status: AC
  Administered 2019-11-26: 20 mg via INTRAVENOUS
  Filled 2019-11-26: qty 2

## 2019-11-26 MED ORDER — SODIUM CHLORIDE 0.9 % IV SOLN
Freq: Once | INTRAVENOUS | Status: AC
  Administered 2019-11-26: 09:00:00 via INTRAVENOUS
  Filled 2019-11-26: qty 250

## 2019-11-26 MED ORDER — SODIUM CHLORIDE 0.9 % IV SOLN
276.0000 mg | Freq: Once | INTRAVENOUS | Status: AC
  Administered 2019-11-26: 280 mg via INTRAVENOUS
  Filled 2019-11-26: qty 28

## 2019-11-26 MED ORDER — DIPHENHYDRAMINE HCL 50 MG/ML IJ SOLN
50.0000 mg | Freq: Once | INTRAMUSCULAR | Status: AC
  Administered 2019-11-26: 50 mg via INTRAVENOUS
  Filled 2019-11-26: qty 1

## 2019-11-26 NOTE — Progress Notes (Signed)
  Oncology Nurse Navigator Documentation  Navigator Location: [facility] (11/26/19 1400)   )Navigator Encounter Type: Treatment (11/26/19 1400)                       Treatment Phase: Active Tx (11/26/19 1400) Barriers/Navigation Needs: No Barriers At This Time (11/26/19 1400)   Interventions: None Required (11/26/19 1400)             met with patient during chemotherapy treatment today. Pt doing well with no complaints. No need or barriers identified at this time. Instructed pt to call with any future needs or questions. Pt verbalized understanding.         Time Spent with Patient: 15 (11/26/19 1400)

## 2019-11-27 ENCOUNTER — Other Ambulatory Visit: Payer: Self-pay

## 2019-11-27 ENCOUNTER — Ambulatory Visit
Admission: RE | Admit: 2019-11-27 | Discharge: 2019-11-27 | Disposition: A | Discharge status: Home / Self Care | Payer: Medicare Other | Source: Ambulatory Visit | Attending: Radiation Oncology | Admitting: Radiation Oncology

## 2019-11-27 DIAGNOSIS — Z510 Encounter for antineoplastic radiation therapy: Secondary | ICD-10-CM | POA: Diagnosis not present

## 2019-11-27 NOTE — Progress Notes (Signed)
Hematology/Oncology Consult note [facility]  Telephone:([phone] [phone]  Patient Care Team: [facility], [facility] as PCP - General ([place]) Telford Nab, RN as Registered Nurse   Name of the patient: Derrick Gallegos  [id number]  1951/02/02   Date of visit: 11/27/19  Diagnosis-  Stage IIIC SCC lung [id number]  Chief complaint/ Reason for visit-on treatment assessment prior to cycle 2 of weekly carbotaxol chemotherapy concurrent with radiation  Heme/Onc history: patient is a 68 year old malewho was a former smoker and smoked about 2 to 3 packs/day for many years and quit smoking a few years ago. He presented to the ER with symptoms of cough and shortness of breathWhich prompted [place] chest x-ray followed by a CT scan. CT scan showed a 3.4 x 3.2 cm left perihilar mass along the left paratracheal precarinal and subcarinal vascular and left hilar adenopathy. Patient has been referred for further management.  PET CT scan showed a large left upper lobe lung mass measuring 6.3 x 3.4 cm. Bilateral mediastinal adenopathy as well as left supraclavicular adenopathy. 3.4 cm partially exophytic lesion of the right kidney. No evidence of distant metastatic disease.  Interval history- reports feeling anxious and low at times. Has chronic low back pain and it sometimes radiates to his right leg  ECOG PS- 1 Pain scale- 0 Opioid associated constipation- no  Review of systems- Review of Systems  Constitutional: Positive for malaise/fatigue. Negative for chills, fever and weight loss.  HENT: Negative for congestion, ear discharge and nosebleeds.   Eyes: Negative for blurred vision.  Respiratory: Negative for cough, hemoptysis, sputum production, shortness of breath and wheezing.   Cardiovascular: Negative for chest pain, palpitations, orthopnea and claudication.  Gastrointestinal: Negative for abdominal pain, blood in stool,  constipation, diarrhea, heartburn, melena, nausea and vomiting.  Genitourinary: Negative for dysuria, flank pain, frequency, hematuria and urgency.  Musculoskeletal: Negative for back pain, joint pain and myalgias.  Skin: Negative for rash.  Neurological: Negative for dizziness, tingling, focal weakness, seizures, weakness and headaches.  Endo/Heme/Allergies: Does not bruise/bleed easily.  Psychiatric/Behavioral: Negative for depression and suicidal ideas. The patient is nervous/anxious. The patient does not have insomnia.        No Known Allergies   Past Medical History:  Diagnosis Date  . Anxiety   . Arthritis   . Atrial flutter ([place])    a. diagnosed 5/19; b. s/p TEE/DCCV 05/2018 by Dr. Humphrey Rolls; c. CHADS2VASc => 4 (HTN, age x1, DM, vascular disease)  . Complication of anesthesia    "wakes up too early"  . Coronary artery disease    a. s/p 3-v cabg in 2004 at [place] (LIMA and radial arery used - no further details in Care Everywhere); b. LHC 8/19 underlying multi-V dz, patent LIMA-LAD, occluded VG-PDA, 90-95% stenosis of LCx s/p PCI/DES x 2  . Cough   . Depression   . Diabetes mellitus with complication ([place])   . Dyspnea   . Dysrhythmia    atrial fib.  . Essential hypertension   . Family history of premature CAD    a. father passed away from an MI at 56  . GERD (gastroesophageal reflux disease)   . Heart murmur   . HLD (hyperlipidemia)    a. statin intolerant   . Mass of left lung   . MI (myocardial infarction) ([place])    2004  . Obesity      Past Surgical History:  Procedure Laterality Date  . CARDIAC CATHETERIZATION  2004  .  CARDIAC CATHETERIZATION  2019   x2 stents [facility]  . CARDIAC SURGERY    . CARDIOVERSION N/A 06/20/2018   Procedure: CARDIOVERSION;  Surgeon: Dionisio David, MD;  Location: [facility] ORS;  Service: Cardiovascular;  Laterality: N/A;  . CORONARY ARTERY BYPASS GRAFT    . CORONARY STENT INTERVENTION N/A 08/08/2018   Procedure: CORONARY STENT INTERVENTION;   Surgeon: Isaias Cowman, MD;  Location: [place] [facility];  Service: Cardiovascular;  Laterality: N/A;  . LEFT HEART CATH AND CORONARY ANGIOGRAPHY Left 08/08/2018   Procedure: LEFT HEART CATH AND CORONARY ANGIOGRAPHY;  Surgeon: Dionisio David, MD;  Location: [place] [facility];  Service: Cardiovascular;  Laterality: Left;  . PORTA CATH INSERTION N/A 11/10/2019   Procedure: PORTA CATH INSERTION;  Surgeon: Algernon Huxley, MD;  Location: [place] [facility];  Service: Cardiovascular;  Laterality: N/A;  . TEE WITHOUT CARDIOVERSION N/A 06/20/2018   Procedure: TRANSESOPHAGEAL ECHOCARDIOGRAM (TEE);  Surgeon: Dionisio David, MD;  Location: [facility] ORS;  Service: Cardiovascular;  Laterality: N/A;  . VIDEO BRONCHOSCOPY N/A 10/29/2019   Procedure: VIDEO BRONCHOSCOPY WITH ENDOBRONCHICAL ULTRASOUND;  Surgeon: Tyler Pita, MD;  Location: [facility] ORS;  Service: Cardiopulmonary;  Laterality: N/A;    Social History   Socioeconomic History  . Marital status: Married    Spouse name: Not on file  . Number of children: Not on file  . Years of education: Not on file  . Highest education level: Not on file  Occupational History  . Not on file  Social Needs  . Financial resource strain: Not on file  . Food insecurity    Worry: Not on file    Inability: Not on file  . Transportation needs    Medical: Not on file    Non-medical: Not on file  Tobacco Use  . Smoking status: Former Smoker    Types: Cigarettes    Quit date: 2004    Years since quitting: 16.9  . Smokeless tobacco: Current User  . Tobacco comment: Quit 2004  Substance and Sexual Activity  . Alcohol use: No  . Drug use: No  . Sexual activity: Not on file  Lifestyle  . Physical activity    Days per week: Not on file    Minutes per session: Not on file  . Stress: Not on file  Relationships  . Social Herbalist on phone: Not on file    Gets together: Not on file    Attends religious service: Not on file    Active  member of club or organization: Not on file    Attends meetings of clubs or organizations: Not on file    Relationship status: Not on file  . Intimate partner violence    Fear of current or ex partner: Not on file    Emotionally abused: Not on file    Physically abused: Not on file    Forced sexual activity: Not on file  Other Topics Concern  . Not on file  Social History Narrative  . Not on file    Family History  Problem Relation Age of Onset  . CAD Mother   . CAD Father        a. MI at 57-->death  . CAD Brother      Current Outpatient Medications:  .  acetaminophen (TYLENOL) 500 MG tablet, Take 1,000 mg by mouth every 6 (six) hours as needed for moderate pain. , Disp: , Rfl:  .  aspirin EC 81 MG tablet,  Take 1 tablet (81 mg total) by mouth daily., Disp: 90 tablet, Rfl: 3 .  dexamethasone (DECADRON) 4 MG tablet, Take 2 tablets (8 mg total) by mouth daily. Start the day after chemotherapy for 2 days., Disp: 30 tablet, Rfl: 1 .  empagliflozin (JARDIANCE) 25 MG TABS tablet, Take 25 mg by mouth daily., Disp: , Rfl:  .  gabapentin (NEURONTIN) 300 MG capsule, Take 300 mg by mouth at bedtime. , Disp: , Rfl:  .  glipiZIDE (GLUCOTROL) 10 MG tablet, Take 10 mg by mouth 2 (two) times daily before a meal., Disp: , Rfl:  .  Glycopyrrolate-Formoterol (BEVESPI AEROSPHERE) 9-4.8 MCG/ACT AERO, Inhale 2 puffs into the lungs 2 (two) times daily., Disp: 10.7 g, Rfl: 11 .  HYDROcodone-homatropine (HYCODAN) 5-1.5 MG/5ML syrup, Take 5 mLs by mouth every 6 (six) hours as needed for cough., Disp: 120 mL, Rfl: 0 .  lidocaine-prilocaine (EMLA) cream, Apply to affected area once, Disp: 30 g, Rfl: 3 .  losartan (COZAAR) 50 MG tablet, Take 50 mg by mouth daily. , Disp: , Rfl:  .  metoprolol tartrate (LOPRESSOR) 25 MG tablet, Take 1 tablet (25 mg total) by mouth 2 (two) times daily., Disp: 180 tablet, Rfl: 0 .  Omega-3 Fatty Acids (FISH OIL) 1200 MG CAPS, Take 2,400 mg by mouth at bedtime. , Disp: , Rfl:  .   omeprazole (PRILOSEC) 20 MG capsule, Take 1 capsule by mouth as needed., Disp: , Rfl:  .  ondansetron (ZOFRAN) 8 MG tablet, Take 1 tablet (8 mg total) by mouth 2 (two) times daily as needed for refractory nausea / vomiting. Start on day 3 after chemo., Disp: 30 tablet, Rfl: 1 .  prochlorperazine (COMPAZINE) 10 MG tablet, Take 1 tablet (10 mg total) by mouth every 6 (six) hours as needed (Nausea or vomiting)., Disp: 30 tablet, Rfl: 1 .  rivaroxaban (XARELTO) 20 MG TABS tablet, Take 1 tablet (20 mg total) by mouth daily with supper., Disp: 90 tablet, Rfl: 2 .  sertraline (ZOLOFT) 25 MG tablet, Take 1 tablet (25 mg total) by mouth daily., Disp: 90 tablet, Rfl: 0 .  LORazepam (ATIVAN) 0.5 MG tablet, Take 1 tablet (0.5 mg total) by mouth every 6 (six) hours as needed for anxiety., Disp: 30 tablet, Rfl: 0 .  Morphine Sulfate (MORPHINE CONCENTRATE) 10 mg / 0.5 ml concentrated solution, Take 0.25 mLs (5 mg total) by mouth every 6 (six) hours as needed for shortness of breath., Disp: 30 mL, Rfl: 0  Physical exam:  Vitals:   11/25/19 1148  BP: 132/72  Pulse: 70  Temp: (!) 96.2 F (35.7 C)  TempSrc: Tympanic  Weight: 256 lb (116.1 kg)   Physical Exam HENT:     Head: Normocephalic and atraumatic.  Eyes:     Pupils: Pupils are equal, round, and reactive to light.  Neck:     Musculoskeletal: Normal range of motion.  Cardiovascular:     Rate and Rhythm: Normal rate and regular rhythm.     Heart sounds: Normal heart sounds.  Pulmonary:     Effort: Pulmonary effort is normal.     Breath sounds: Normal breath sounds.  Abdominal:     General: Bowel sounds are normal.     Palpations: Abdomen is soft.  Skin:    General: Skin is warm and dry.  Neurological:     Mental Status: He is alert and oriented to person, place, and time.      CMP Latest Ref Rng & Units 11/26/2019  Glucose  70 - 99 mg/dL 216(H)  BUN 8 - 23 mg/dL 18  Creatinine 0.61 - 1.24 mg/dL 0.72  Sodium 135 - 145 mmol/L 128(L)   Potassium 3.5 - 5.1 mmol/L 3.9  Chloride 98 - 111 mmol/L 101  CO2 22 - 32 mmol/L 24  Calcium 8.9 - 10.3 mg/dL 8.9  Total Protein 6.5 - 8.1 g/dL 7.3  Total Bilirubin 0.3 - 1.2 mg/dL 0.6  Alkaline Phos 38 - 126 U/L 74  AST 15 - 41 U/L 16  ALT 0 - 44 U/L 15   CBC Latest Ref Rng & Units 11/26/2019  WBC 4.0 - 10.5 K/uL 6.9  Hemoglobin 13.0 - 17.0 g/dL 13.8  Hematocrit 39.0 - 52.0 % 42.0  Platelets 150 - 400 K/uL 341      Assessment and plan- Patient is a 68 y.o. male with stage IIIc squamous cell carcinoma of the lung . He is here for on treatment assessment prior to cycle 2 of weekly carbo taxol chemotherapy  Counts ok to proceed with cycle 2 of weekly carbo taxol chemotherapy today. He will directly proceed with cycle 3 of chemo next week   Patient has ongoing problems with anxiety and he is currently on as needed Ativan.  I also encouraged him to try Zoloft that was prescribed but he does not wish to try at the moment.  Chronic low back pain: PET CT scan did not reveal any evidence of malignancy.  Discussed referral to Ortho upon completion of chemoradiation.  Patient agrees.  He will continue taking as needed morphine which she has been using for his dyspnea as well as chest wall pain   Visit Diagnosis 1. Malignant neoplasm of upper lobe of left lung ([place])   2. Encounter for antineoplastic chemotherapy   3. Anxiety and depression      [name], MD, MPH [facility] at [facility] [phone] 11/27/2019 12:30 PM

## 2019-11-28 ENCOUNTER — Ambulatory Visit
Admission: RE | Admit: 2019-11-28 | Discharge: 2019-11-28 | Disposition: A | Discharge status: Home / Self Care | Payer: Medicare Other | Source: Ambulatory Visit | Attending: Radiation Oncology | Admitting: Radiation Oncology

## 2019-11-28 ENCOUNTER — Other Ambulatory Visit: Payer: Self-pay

## 2019-11-28 DIAGNOSIS — Z510 Encounter for antineoplastic radiation therapy: Secondary | ICD-10-CM | POA: Diagnosis not present

## 2019-11-29 ENCOUNTER — Ambulatory Visit: Payer: Medicare Other

## 2019-12-01 ENCOUNTER — Ambulatory Visit
Admission: RE | Admit: 2019-12-01 | Discharge: 2019-12-01 | Disposition: A | Discharge status: Home / Self Care | Payer: Medicare Other | Source: Ambulatory Visit | Attending: Radiation Oncology | Admitting: Radiation Oncology

## 2019-12-01 ENCOUNTER — Other Ambulatory Visit: Payer: Self-pay

## 2019-12-01 ENCOUNTER — Encounter: Payer: Self-pay | Admitting: *Deleted

## 2019-12-01 DIAGNOSIS — Z510 Encounter for antineoplastic radiation therapy: Secondary | ICD-10-CM | POA: Diagnosis not present

## 2019-12-02 ENCOUNTER — Inpatient Hospital Stay: Payer: Medicare Other

## 2019-12-02 ENCOUNTER — Ambulatory Visit
Admission: RE | Admit: 2019-12-02 | Discharge: 2019-12-02 | Disposition: A | Discharge status: Home / Self Care | Payer: Medicare Other | Source: Ambulatory Visit | Attending: Radiation Oncology | Admitting: Radiation Oncology

## 2019-12-02 ENCOUNTER — Other Ambulatory Visit: Payer: Self-pay | Admitting: *Deleted

## 2019-12-02 ENCOUNTER — Other Ambulatory Visit: Payer: Self-pay

## 2019-12-02 DIAGNOSIS — Z510 Encounter for antineoplastic radiation therapy: Secondary | ICD-10-CM | POA: Diagnosis not present

## 2019-12-02 MED ORDER — SUCRALFATE 1 G PO TABS: 1.0000 g | ORAL_TABLET | Freq: Three times a day (TID) | ORAL | 6 refills | Status: DC

## 2019-12-03 ENCOUNTER — Other Ambulatory Visit: Payer: Self-pay

## 2019-12-03 ENCOUNTER — Ambulatory Visit
Admission: RE | Admit: 2019-12-03 | Discharge: 2019-12-03 | Disposition: A | Discharge status: Home / Self Care | Payer: Medicare Other | Source: Ambulatory Visit | Attending: Radiation Oncology | Admitting: Radiation Oncology

## 2019-12-03 DIAGNOSIS — Z510 Encounter for antineoplastic radiation therapy: Secondary | ICD-10-CM | POA: Diagnosis not present

## 2019-12-04 ENCOUNTER — Inpatient Hospital Stay: Payer: Medicare Other

## 2019-12-04 ENCOUNTER — Ambulatory Visit
Admission: RE | Admit: 2019-12-04 | Discharge: 2019-12-04 | Disposition: A | Discharge status: Home / Self Care | Payer: Medicare Other | Source: Ambulatory Visit | Attending: Radiation Oncology | Admitting: Radiation Oncology

## 2019-12-04 ENCOUNTER — Ambulatory Visit: Payer: Medicare Other | Admitting: Oncology

## 2019-12-04 ENCOUNTER — Other Ambulatory Visit: Payer: Self-pay

## 2019-12-04 VITALS — BP 117/75 | HR 72 | Temp 95.7°F | Resp 18 | Wt 254.4 lb

## 2019-12-04 DIAGNOSIS — C3412 Malignant neoplasm of upper lobe, left bronchus or lung: Secondary | ICD-10-CM

## 2019-12-04 DIAGNOSIS — Z5111 Encounter for antineoplastic chemotherapy: Secondary | ICD-10-CM | POA: Diagnosis not present

## 2019-12-04 DIAGNOSIS — Z510 Encounter for antineoplastic radiation therapy: Secondary | ICD-10-CM | POA: Diagnosis not present

## 2019-12-04 LAB — COMPREHENSIVE METABOLIC PANEL
ALT: 16 U/L (ref 0–44)
AST: 15 U/L (ref 15–41)
Albumin: 3.6 g/dL (ref 3.5–5.0)
Alkaline Phosphatase: 71 U/L (ref 38–126)
Anion gap: 9 (ref 5–15)
BUN: 19 mg/dL (ref 8–23)
CO2: 22 mmol/L (ref 22–32)
Calcium: 8.7 mg/dL — ABNORMAL LOW (ref 8.9–10.3)
Chloride: 101 mmol/L (ref 98–111)
Creatinine, Ser: 0.7 mg/dL (ref 0.61–1.24)
GFR calc Af Amer: >60 mL/min (ref >60)
GFR calc non Af Amer: >60 mL/min (ref >60)
Glucose, Bld: 196 mg/dL — ABNORMAL HIGH (ref 70–99)
Potassium: 3.9 mmol/L (ref 3.5–5.1)
Sodium: 132 mmol/L — ABNORMAL LOW (ref 135–145)
Total Bilirubin: 0.4 mg/dL (ref 0.3–1.2)
Total Protein: 7.1 g/dL (ref 6.5–8.1)

## 2019-12-04 LAB — CBC WITH DIFFERENTIAL/PLATELET
Abs Immature Granulocytes: 0.05 10*3/uL (ref 0.00–0.07)
Basophils Absolute: 0 10*3/uL (ref 0.0–0.1)
Basophils Relative: 0 %
Eosinophils Absolute: 0.1 10*3/uL (ref 0.0–0.5)
Eosinophils Relative: 1 %
HCT: 38.7 % — ABNORMAL LOW (ref 39.0–52.0)
Hemoglobin: 13.1 g/dL (ref 13.0–17.0)
Immature Granulocytes: 1 %
Lymphocytes Relative: 11 %
Lymphs Abs: 0.6 10*3/uL — ABNORMAL LOW (ref 0.7–4.0)
MCH: 30.8 pg (ref 26.0–34.0)
MCHC: 33.9 g/dL (ref 30.0–36.0)
MCV: 90.8 fL (ref 80.0–100.0)
Monocytes Absolute: 0.8 10*3/uL (ref 0.1–1.0)
Monocytes Relative: 14 %
Neutro Abs: 4 10*3/uL (ref 1.7–7.7)
Neutrophils Relative %: 73 %
Platelets: 291 10*3/uL (ref 150–400)
RBC: 4.26 MIL/uL (ref 4.22–5.81)
RDW: 13.1 % (ref 11.5–15.5)
WBC: 5.5 10*3/uL (ref 4.0–10.5)
nRBC: 0 % (ref 0.0–0.2)

## 2019-12-04 MED ORDER — SODIUM CHLORIDE 0.9 % IV SOLN
Freq: Once | INTRAVENOUS | Status: AC
  Administered 2019-12-04: 09:00:00 via INTRAVENOUS
  Filled 2019-12-04: qty 250

## 2019-12-04 MED ORDER — FAMOTIDINE IN NACL 20-0.9 MG/50ML-% IV SOLN
20.0000 mg | Freq: Once | INTRAVENOUS | Status: AC
  Administered 2019-12-04: 20 mg via INTRAVENOUS
  Filled 2019-12-04: qty 50

## 2019-12-04 MED ORDER — SODIUM CHLORIDE 0.9 % IV SOLN
276.0000 mg | Freq: Once | INTRAVENOUS | Status: AC
  Administered 2019-12-04: 280 mg via INTRAVENOUS
  Filled 2019-12-04: qty 28

## 2019-12-04 MED ORDER — DIPHENHYDRAMINE HCL 50 MG/ML IJ SOLN
50.0000 mg | Freq: Once | INTRAMUSCULAR | Status: AC
  Administered 2019-12-04: 50 mg via INTRAVENOUS
  Filled 2019-12-04: qty 1

## 2019-12-04 MED ORDER — SODIUM CHLORIDE 0.9 % IV SOLN
45.0000 mg/m2 | Freq: Once | INTRAVENOUS | Status: AC
  Administered 2019-12-04: 108 mg via INTRAVENOUS
  Filled 2019-12-04: qty 18

## 2019-12-04 MED ORDER — HEPARIN SOD (PORK) LOCK FLUSH 100 UNIT/ML IV SOLN
500.0000 [IU] | Freq: Once | INTRAVENOUS | Status: AC | PRN
  Administered 2019-12-04: 500 [IU]
  Filled 2019-12-04: qty 5

## 2019-12-04 MED ORDER — PALONOSETRON HCL INJECTION 0.25 MG/5ML
0.2500 mg | Freq: Once | INTRAVENOUS | Status: AC
  Administered 2019-12-04: 0.25 mg via INTRAVENOUS
  Filled 2019-12-04: qty 5

## 2019-12-04 MED ORDER — SODIUM CHLORIDE 0.9 % IV SOLN
20.0000 mg | Freq: Once | INTRAVENOUS | Status: AC
  Administered 2019-12-04: 20 mg via INTRAVENOUS
  Filled 2019-12-04: qty 2

## 2019-12-04 NOTE — Progress Notes (Signed)
Pt reports having constipation. He reports that he has been taking Miralax "three times a day and it is not working. I still can only go a little every other day." Dr. Janese Banks aware. Per Dr. Janese Banks pt to take Miralax twice daily and Senna twice daily. Pt aware and verbalizes understanding. Written and verbal instructions given to pt. Pt educated to call clinic if symptoms persist or worsen or to report to ER/call 911 in the case of an emergency. Pt verbalizes understanding.   Pt stable at discharge.

## 2019-12-05 ENCOUNTER — Other Ambulatory Visit: Payer: Self-pay

## 2019-12-05 ENCOUNTER — Ambulatory Visit
Admission: RE | Admit: 2019-12-05 | Discharge: 2019-12-05 | Disposition: A | Discharge status: Home / Self Care | Payer: Medicare Other | Source: Ambulatory Visit | Attending: Radiation Oncology | Admitting: Radiation Oncology

## 2019-12-05 DIAGNOSIS — Z510 Encounter for antineoplastic radiation therapy: Secondary | ICD-10-CM | POA: Diagnosis not present

## 2019-12-07 ENCOUNTER — Other Ambulatory Visit: Payer: Self-pay | Admitting: Oncology

## 2019-12-07 DIAGNOSIS — C3412 Malignant neoplasm of upper lobe, left bronchus or lung: Secondary | ICD-10-CM

## 2019-12-08 ENCOUNTER — Other Ambulatory Visit: Payer: Self-pay

## 2019-12-08 ENCOUNTER — Ambulatory Visit
Admission: RE | Admit: 2019-12-08 | Discharge: 2019-12-08 | Disposition: A | Discharge status: Home / Self Care | Payer: Medicare Other | Source: Ambulatory Visit | Attending: Radiation Oncology | Admitting: Radiation Oncology

## 2019-12-08 DIAGNOSIS — Z510 Encounter for antineoplastic radiation therapy: Secondary | ICD-10-CM | POA: Diagnosis not present

## 2019-12-08 MED ORDER — LORAZEPAM 0.5 MG PO TABS: 0.5000 mg | ORAL_TABLET | Freq: Four times a day (QID) | ORAL | 0 refills | Status: DC | PRN

## 2019-12-08 NOTE — Telephone Encounter (Signed)
Dr. Janese Banks patient.

## 2019-12-09 ENCOUNTER — Inpatient Hospital Stay: Payer: Medicare Other

## 2019-12-09 ENCOUNTER — Other Ambulatory Visit: Payer: Self-pay

## 2019-12-09 ENCOUNTER — Ambulatory Visit
Admission: RE | Admit: 2019-12-09 | Discharge: 2019-12-09 | Disposition: A | Discharge status: Home / Self Care | Payer: Medicare Other | Source: Ambulatory Visit | Attending: Radiation Oncology | Admitting: Radiation Oncology

## 2019-12-09 DIAGNOSIS — Z510 Encounter for antineoplastic radiation therapy: Secondary | ICD-10-CM | POA: Diagnosis not present

## 2019-12-10 ENCOUNTER — Ambulatory Visit
Admission: RE | Admit: 2019-12-10 | Discharge: 2019-12-10 | Disposition: A | Discharge status: Home / Self Care | Payer: Medicare Other | Source: Ambulatory Visit | Attending: Radiation Oncology | Admitting: Radiation Oncology

## 2019-12-10 ENCOUNTER — Other Ambulatory Visit: Payer: Self-pay

## 2019-12-10 DIAGNOSIS — Z510 Encounter for antineoplastic radiation therapy: Secondary | ICD-10-CM | POA: Diagnosis not present

## 2019-12-11 ENCOUNTER — Inpatient Hospital Stay: Payer: Medicare Other

## 2019-12-11 ENCOUNTER — Other Ambulatory Visit: Payer: Self-pay | Admitting: *Deleted

## 2019-12-11 ENCOUNTER — Ambulatory Visit
Admission: RE | Admit: 2019-12-11 | Discharge: 2019-12-11 | Disposition: A | Discharge status: Home / Self Care | Payer: Medicare Other | Source: Ambulatory Visit | Attending: Radiation Oncology | Admitting: Radiation Oncology

## 2019-12-11 ENCOUNTER — Inpatient Hospital Stay (HOSPITAL_BASED_OUTPATIENT_CLINIC_OR_DEPARTMENT_OTHER): Payer: Medicare Other | Admitting: Oncology

## 2019-12-11 ENCOUNTER — Encounter: Payer: Self-pay | Admitting: Oncology

## 2019-12-11 ENCOUNTER — Other Ambulatory Visit: Payer: Self-pay

## 2019-12-11 VITALS — Resp 20

## 2019-12-11 VITALS — BP 112/94 | HR 70 | Temp 94.0°F | Wt 248.0 lb

## 2019-12-11 DIAGNOSIS — Z510 Encounter for antineoplastic radiation therapy: Secondary | ICD-10-CM | POA: Diagnosis not present

## 2019-12-11 DIAGNOSIS — C3412 Malignant neoplasm of upper lobe, left bronchus or lung: Secondary | ICD-10-CM

## 2019-12-11 DIAGNOSIS — I2510 Atherosclerotic heart disease of native coronary artery without angina pectoris: Secondary | ICD-10-CM

## 2019-12-11 DIAGNOSIS — Z95828 Presence of other vascular implants and grafts: Secondary | ICD-10-CM

## 2019-12-11 DIAGNOSIS — G893 Neoplasm related pain (acute) (chronic): Secondary | ICD-10-CM | POA: Diagnosis not present

## 2019-12-11 DIAGNOSIS — Z5111 Encounter for antineoplastic chemotherapy: Secondary | ICD-10-CM | POA: Diagnosis not present

## 2019-12-11 DIAGNOSIS — F419 Anxiety disorder, unspecified: Secondary | ICD-10-CM | POA: Diagnosis not present

## 2019-12-11 LAB — CBC WITH DIFFERENTIAL/PLATELET
Abs Immature Granulocytes: 0.11 10*3/uL — ABNORMAL HIGH (ref 0.00–0.07)
Basophils Absolute: 0 10*3/uL (ref 0.0–0.1)
Basophils Relative: 1 %
Eosinophils Absolute: 0.1 10*3/uL (ref 0.0–0.5)
Eosinophils Relative: 2 %
HCT: 41.2 % (ref 39.0–52.0)
Hemoglobin: 13.9 g/dL (ref 13.0–17.0)
Immature Granulocytes: 3 %
Lymphocytes Relative: 16 %
Lymphs Abs: 0.6 10*3/uL — ABNORMAL LOW (ref 0.7–4.0)
MCH: 30.6 pg (ref 26.0–34.0)
MCHC: 33.7 g/dL (ref 30.0–36.0)
MCV: 90.7 fL (ref 80.0–100.0)
Monocytes Absolute: 0.5 10*3/uL (ref 0.1–1.0)
Monocytes Relative: 13 %
Neutro Abs: 2.7 10*3/uL (ref 1.7–7.7)
Neutrophils Relative %: 65 %
Platelets: 205 10*3/uL (ref 150–400)
RBC: 4.54 MIL/uL (ref 4.22–5.81)
RDW: 13.2 % (ref 11.5–15.5)
WBC: 4 10*3/uL (ref 4.0–10.5)
nRBC: 0 % (ref 0.0–0.2)

## 2019-12-11 LAB — COMPREHENSIVE METABOLIC PANEL
ALT: 19 U/L (ref 0–44)
AST: 17 U/L (ref 15–41)
Albumin: 3.7 g/dL (ref 3.5–5.0)
Alkaline Phosphatase: 70 U/L (ref 38–126)
Anion gap: 10 (ref 5–15)
BUN: 18 mg/dL (ref 8–23)
CO2: 23 mmol/L (ref 22–32)
Calcium: 8.9 mg/dL (ref 8.9–10.3)
Chloride: 102 mmol/L (ref 98–111)
Creatinine, Ser: 0.6 mg/dL — ABNORMAL LOW (ref 0.61–1.24)
GFR calc Af Amer: >60 mL/min (ref >60)
GFR calc non Af Amer: >60 mL/min (ref >60)
Glucose, Bld: 183 mg/dL — ABNORMAL HIGH (ref 70–99)
Potassium: 3.9 mmol/L (ref 3.5–5.1)
Sodium: 135 mmol/L (ref 135–145)
Total Bilirubin: 0.6 mg/dL (ref 0.3–1.2)
Total Protein: 6.7 g/dL (ref 6.5–8.1)

## 2019-12-11 MED ORDER — SODIUM CHLORIDE 0.9 % IV SOLN
276.0000 mg | Freq: Once | INTRAVENOUS | Status: AC
  Administered 2019-12-11: 280 mg via INTRAVENOUS
  Filled 2019-12-11: qty 28

## 2019-12-11 MED ORDER — DIPHENHYDRAMINE HCL 50 MG/ML IJ SOLN
50.0000 mg | Freq: Once | INTRAMUSCULAR | Status: AC
  Administered 2019-12-11: 50 mg via INTRAVENOUS
  Filled 2019-12-11: qty 1

## 2019-12-11 MED ORDER — SODIUM CHLORIDE 0.9% FLUSH
10.0000 mL | Freq: Once | INTRAVENOUS | Status: AC
  Administered 2019-12-11: 10 mL via INTRAVENOUS
  Filled 2019-12-11: qty 10

## 2019-12-11 MED ORDER — SODIUM CHLORIDE 0.9 % IV SOLN
20.0000 mg | Freq: Once | INTRAVENOUS | Status: AC
  Administered 2019-12-11: 20 mg via INTRAVENOUS
  Filled 2019-12-11: qty 2

## 2019-12-11 MED ORDER — HEPARIN SOD (PORK) LOCK FLUSH 100 UNIT/ML IV SOLN
INTRAVENOUS | Status: AC
  Filled 2019-12-11: qty 5

## 2019-12-11 MED ORDER — PALONOSETRON HCL INJECTION 0.25 MG/5ML
0.2500 mg | Freq: Once | INTRAVENOUS | Status: AC
  Administered 2019-12-11: 0.25 mg via INTRAVENOUS
  Filled 2019-12-11: qty 5

## 2019-12-11 MED ORDER — SODIUM CHLORIDE 0.9 % IV SOLN
45.0000 mg/m2 | Freq: Once | INTRAVENOUS | Status: AC
  Administered 2019-12-11: 108 mg via INTRAVENOUS
  Filled 2019-12-11: qty 18

## 2019-12-11 MED ORDER — SODIUM CHLORIDE 0.9 % IV SOLN
Freq: Once | INTRAVENOUS | Status: AC
  Filled 2019-12-11: qty 250

## 2019-12-11 MED ORDER — OXYCODONE HCL 5 MG PO TABS: 5.0000 mg | ORAL_TABLET | ORAL | 0 refills | Status: DC | PRN

## 2019-12-11 MED ORDER — FAMOTIDINE IN NACL 20-0.9 MG/50ML-% IV SOLN
20.0000 mg | Freq: Once | INTRAVENOUS | Status: AC
  Administered 2019-12-11: 20 mg via INTRAVENOUS
  Filled 2019-12-11: qty 50

## 2019-12-11 MED ORDER — HEPARIN SOD (PORK) LOCK FLUSH 100 UNIT/ML IV SOLN
500.0000 [IU] | Freq: Once | INTRAVENOUS | Status: AC | PRN
  Administered 2019-12-11: 500 [IU]
  Filled 2019-12-11: qty 5

## 2019-12-11 NOTE — Progress Notes (Signed)
Hematology/Oncology Consult note [facility]  [phone] [phone]  Patient Care Team: Center, [facility] as PCP - General ([place]) Telford Nab, RN as Registered Nurse   Name of the patient: Derrick Gallegos  [id number]  10-26-1951   Date of visit: 12/11/19  Diagnosis- Stage IIIC SCC lung [id number]  Chief complaint/ Reason for visit-on treatment assessment prior to cycle 4 of weekly carbotaxol chemotherapy concurrent with radiation  Heme/Onc history: patient is a 68 year old malewho was a former smoker and smoked about 2 to 3 packs/day for many years and quit smoking a few years ago. He presented to the ER with symptoms of cough and shortness of breathWhich prompted [place] chest x-ray followed by a CT scan. CT scan showed a 3.4 x 3.2 cm left perihilar mass along the left paratracheal precarinal and subcarinal vascular and left hilar adenopathy. Patient has been referred for further management.  PET CT scan showed a large left upper lobe lung mass measuring 6.3 x 3.4 cm. Bilateral mediastinal adenopathy as well as left supraclavicular adenopathy. 3.4 cm partially exophytic lesion of the right kidney. No evidence of distant metastatic disease.  Cycle 1 of weekly carbotaxol chemotherapy with radiation started on 11/17/2019  Interval history-reports feeling anxious.  He has been taking as needed Ativan but has been hesitant to try Zoloft.  Also reports having a $60 co-pay with Zoloft and he would like a cheaper alternative.  Also reports stomach pain and burning during swallowing.  Reports ongoing fatigue  ECOG PS- 1 Pain scale- 0 Opioid associated constipation- no  Review of systems- Review of Systems  Constitutional: Positive for malaise/fatigue. Negative for chills, fever and weight loss.  HENT: Negative for congestion, ear discharge and nosebleeds.   Eyes: Negative for blurred vision.  Respiratory:  Negative for cough, hemoptysis, sputum production, shortness of breath and wheezing.   Cardiovascular: Negative for chest pain, palpitations, orthopnea and claudication.  Gastrointestinal: Negative for abdominal pain, blood in stool, constipation, diarrhea, heartburn, melena, nausea and vomiting.       Difficulty swallowing  Genitourinary: Negative for dysuria, flank pain, frequency, hematuria and urgency.  Musculoskeletal: Negative for back pain, joint pain and myalgias.  Skin: Negative for rash.  Neurological: Negative for dizziness, tingling, focal weakness, seizures, weakness and headaches.  Endo/Heme/Allergies: Does not bruise/bleed easily.  Psychiatric/Behavioral: Negative for depression and suicidal ideas. The patient is nervous/anxious. The patient does not have insomnia.       No Known Allergies   Past Medical History:  Diagnosis Date  . Anxiety   . Arthritis   . Atrial flutter ([place])    a. diagnosed 5/19; b. s/p TEE/DCCV 05/2018 by Dr. Humphrey Rolls; c. CHADS2VASc => 4 (HTN, age x1, DM, vascular disease)  . Complication of anesthesia    "wakes up too early"  . Coronary artery disease    a. s/p 3-v cabg in 2004 at [place] (LIMA and radial arery used - no further details in Care Everywhere); b. LHC 8/19 underlying multi-V dz, patent LIMA-LAD, occluded VG-PDA, 90-95% stenosis of LCx s/p PCI/DES x 2  . Cough   . Depression   . Diabetes mellitus with complication ([place])   . Dyspnea   . Dysrhythmia    atrial fib.  . Essential hypertension   . Family history of premature CAD    a. father passed away from an MI at 85  . GERD (gastroesophageal reflux disease)   . Heart murmur   . HLD (hyperlipidemia)  a. statin intolerant   . Mass of left lung   . MI (myocardial infarction) ([place])    2004  . Obesity      Past Surgical History:  Procedure Laterality Date  . CARDIAC CATHETERIZATION  2004  . CARDIAC CATHETERIZATION  2019   x2 stents [facility]  . CARDIAC SURGERY    . CARDIOVERSION N/A  06/20/2018   Procedure: CARDIOVERSION;  Surgeon: Dionisio David, MD;  Location: [facility] ORS;  Service: Cardiovascular;  Laterality: N/A;  . CORONARY ARTERY BYPASS GRAFT    . CORONARY STENT INTERVENTION N/A 08/08/2018   Procedure: CORONARY STENT INTERVENTION;  Surgeon: Isaias Cowman, MD;  Location: [place] [facility];  Service: Cardiovascular;  Laterality: N/A;  . LEFT HEART CATH AND CORONARY ANGIOGRAPHY Left 08/08/2018   Procedure: LEFT HEART CATH AND CORONARY ANGIOGRAPHY;  Surgeon: Dionisio David, MD;  Location: [place] [facility];  Service: Cardiovascular;  Laterality: Left;  . PORTA CATH INSERTION N/A 11/10/2019   Procedure: PORTA CATH INSERTION;  Surgeon: Algernon Huxley, MD;  Location: [place] [facility];  Service: Cardiovascular;  Laterality: N/A;  . TEE WITHOUT CARDIOVERSION N/A 06/20/2018   Procedure: TRANSESOPHAGEAL ECHOCARDIOGRAM (TEE);  Surgeon: Dionisio David, MD;  Location: [facility] ORS;  Service: Cardiovascular;  Laterality: N/A;  . VIDEO BRONCHOSCOPY N/A 10/29/2019   Procedure: VIDEO BRONCHOSCOPY WITH ENDOBRONCHICAL ULTRASOUND;  Surgeon: Tyler Pita, MD;  Location: [facility] ORS;  Service: Cardiopulmonary;  Laterality: N/A;    Social History   Socioeconomic History  . Marital status: Married    Spouse name: Not on file  . Number of children: Not on file  . Years of education: Not on file  . Highest education level: Not on file  Occupational History  . Not on file  Tobacco Use  . Smoking status: Former Smoker    Types: Cigarettes    Quit date: 2004    Years since quitting: 16.9  . Smokeless tobacco: Current User  . Tobacco comment: Quit 2004  Substance and Sexual Activity  . Alcohol use: No  . Drug use: No  . Sexual activity: Not on file  Other Topics Concern  . Not on file  Social History Narrative  . Not on file   Social Determinants of Health   Financial Resource Strain:   . Difficulty of Paying Living Expenses: Not on file  Food Insecurity:   .  Worried About Charity fundraiser in the Last Year: Not on file  . Ran Out of Food in the Last Year: Not on file  Transportation Needs:   . Lack of Transportation (Medical): Not on file  . Lack of Transportation (Non-Medical): Not on file  Physical Activity:   . Days of Exercise per Week: Not on file  . Minutes of Exercise per Session: Not on file  Stress:   . Feeling of Stress : Not on file  Social Connections:   . Frequency of Communication with Friends and Family: Not on file  . Frequency of Social Gatherings with Friends and Family: Not on file  . Attends Religious Services: Not on file  . Active Member of Clubs or Organizations: Not on file  . Attends Archivist Meetings: Not on file  . Marital Status: Not on file  Intimate Partner Violence:   . Fear of Current or Ex-Partner: Not on file  . Emotionally Abused: Not on file  . Physically Abused: Not on file  . Sexually Abused: Not on file  Family History  Problem Relation Age of Onset  . CAD Mother   . CAD Father        a. MI at 57-->death  . CAD Brother      Current Outpatient Medications:  .  acetaminophen (TYLENOL) 500 MG tablet, Take 1,000 mg by mouth every 6 (six) hours as needed for moderate pain. , Disp: , Rfl:  .  aspirin EC 81 MG tablet, Take 1 tablet (81 mg total) by mouth daily., Disp: 90 tablet, Rfl: 3 .  dexamethasone (DECADRON) 4 MG tablet, Take 2 tablets (8 mg total) by mouth daily. Start the day after chemotherapy for 2 days., Disp: 30 tablet, Rfl: 1 .  empagliflozin (JARDIANCE) 25 MG TABS tablet, Take 25 mg by mouth daily., Disp: , Rfl:  .  gabapentin (NEURONTIN) 300 MG capsule, Take 300 mg by mouth at bedtime. , Disp: , Rfl:  .  glipiZIDE (GLUCOTROL) 10 MG tablet, Take 10 mg by mouth 2 (two) times daily before a meal., Disp: , Rfl:  .  Glycopyrrolate-Formoterol (BEVESPI AEROSPHERE) 9-4.8 MCG/ACT AERO, Inhale 2 puffs into the lungs 2 (two) times daily., Disp: 10.7 g, Rfl: 11 .   HYDROcodone-homatropine (HYCODAN) 5-1.5 MG/5ML syrup, Take 5 mLs by mouth every 6 (six) hours as needed for cough., Disp: 120 mL, Rfl: 0 .  lidocaine-prilocaine (EMLA) cream, Apply to affected area once, Disp: 30 g, Rfl: 3 .  LORazepam (ATIVAN) 0.5 MG tablet, Take 1 tablet (0.5 mg total) by mouth every 6 (six) hours as needed for anxiety., Disp: 30 tablet, Rfl: 0 .  losartan (COZAAR) 50 MG tablet, Take 50 mg by mouth daily. , Disp: , Rfl:  .  metoprolol tartrate (LOPRESSOR) 25 MG tablet, Take 1 tablet (25 mg total) by mouth 2 (two) times daily., Disp: 180 tablet, Rfl: 0 .  Morphine Sulfate (MORPHINE CONCENTRATE) 10 mg / 0.5 ml concentrated solution, Take 0.25 mLs (5 mg total) by mouth every 6 (six) hours as needed for shortness of breath., Disp: 30 mL, Rfl: 0 .  Omega-3 Fatty Acids (FISH OIL) 1200 MG CAPS, Take 2,400 mg by mouth at bedtime. , Disp: , Rfl:  .  omeprazole (PRILOSEC) 20 MG capsule, Take 1 capsule by mouth as needed., Disp: , Rfl:  .  ondansetron (ZOFRAN) 8 MG tablet, Take 1 tablet (8 mg total) by mouth 2 (two) times daily as needed for refractory nausea / vomiting. Start on day 3 after chemo., Disp: 30 tablet, Rfl: 1 .  prochlorperazine (COMPAZINE) 10 MG tablet, Take 1 tablet (10 mg total) by mouth every 6 (six) hours as needed (Nausea or vomiting)., Disp: 30 tablet, Rfl: 1 .  rivaroxaban (XARELTO) 20 MG TABS tablet, Take 1 tablet (20 mg total) by mouth daily with supper., Disp: 90 tablet, Rfl: 2 .  sertraline (ZOLOFT) 25 MG tablet, Take 1 tablet (25 mg total) by mouth daily., Disp: 90 tablet, Rfl: 0 .  sucralfate (CARAFATE) 1 g tablet, Take 1 tablet (1 g total) by mouth 3 (three) times daily before meals. Dissolve tablet in warm water, swish and swallow, Disp: 90 tablet, Rfl: 6 .  oxyCODONE (OXY IR/ROXICODONE) 5 MG immediate release tablet, Take 1 tablet (5 mg total) by mouth every 4 (four) hours as needed for severe pain., Disp: 180 tablet, Rfl: 0 No current facility-administered  medications for this visit.  Facility-Administered Medications Ordered in Other Visits:  .  CARBOplatin (PARAPLATIN) 280 mg in sodium chloride 0.9 % 250 mL chemo infusion, 280 mg, Intravenous, Once,  Sindy Guadeloupe, MD .  dexamethasone (DECADRON) 20 mg in sodium chloride 0.9 % 50 mL IVPB, 20 mg, Intravenous, Once, Sindy Guadeloupe, MD .  famotidine (PEPCID) IVPB 20 mg premix, 20 mg, Intravenous, Once, Sindy Guadeloupe, MD, Last Rate: 200 mL/hr at 12/11/19 1052, 20 mg at 12/11/19 1052 .  heparin lock flush 100 unit/mL, 500 Units, Intracatheter, Once PRN, Sindy Guadeloupe, MD .  PACLitaxel (TAXOL) 108 mg in sodium chloride 0.9 % 250 mL chemo infusion (</= 80mg /m2), 45 mg/m2 (Treatment Plan Recorded), Intravenous, Once, Sindy Guadeloupe, MD  Physical exam:  Vitals:   12/11/19 0940  BP: (!) 112/94  Pulse: 70  Temp: (!) 94 F (34.4 C)  TempSrc: Tympanic  Weight: 248 lb (112.5 kg)   Physical Exam HENT:     Head: Normocephalic and atraumatic.     Mouth/Throat:     Mouth: Mucous membranes are moist.     Pharynx: Oropharynx is clear.  Eyes:     Pupils: Pupils are equal, round, and reactive to light.  Cardiovascular:     Rate and Rhythm: Normal rate and regular rhythm.     Heart sounds: Normal heart sounds.  Pulmonary:     Effort: Pulmonary effort is normal.     Breath sounds: Normal breath sounds.  Abdominal:     General: Bowel sounds are normal.     Palpations: Abdomen is soft.  Musculoskeletal:     Cervical back: Normal range of motion.  Skin:    General: Skin is warm and dry.  Neurological:     Mental Status: He is alert and oriented to person, place, and time.      CMP Latest Ref Rng & Units 12/11/2019  Glucose 70 - 99 mg/dL 183(H)  BUN 8 - 23 mg/dL 18  Creatinine 0.61 - 1.24 mg/dL 0.60(L)  Sodium 135 - 145 mmol/L 135  Potassium 3.5 - 5.1 mmol/L 3.9  Chloride 98 - 111 mmol/L 102  CO2 22 - 32 mmol/L 23  Calcium 8.9 - 10.3 mg/dL 8.9  Total Protein 6.5 - 8.1 g/dL 6.7  Total  Bilirubin 0.3 - 1.2 mg/dL 0.6  Alkaline Phos 38 - 126 U/L 70  AST 15 - 41 U/L 17  ALT 0 - 44 U/L 19   CBC Latest Ref Rng & Units 12/11/2019  WBC 4.0 - 10.5 K/uL 4.0  Hemoglobin 13.0 - 17.0 g/dL 13.9  Hematocrit 39.0 - 52.0 % 41.2  Platelets 150 - 400 K/uL 205     Assessment and plan- Patient is a 68 y.o. 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 He is here for on treatment assessment prior to cycle 4 of weekly carbotaxol chemotherapy concurrent with radiation  Counts okay to proceed with cycle 4 of weekly carbotaxol chemotherapy today.  He will directly proceed for cycle 5 and I will see him back in 2 weeks for cycle 6.  Anxiety: Continue as needed Ativan I will see if Cymbalta would be less expensive alternative for the patient.  I will switch him from morphine to oxycodone 5 mg every 4 hours as needed for pain during swallowing due to radiation   Visit Diagnosis 1. Malignant neoplasm of upper lobe of left lung ([place])   2. Encounter for antineoplastic chemotherapy   3. Anxiety   4. Neoplasm related pain      [name], MD, MPH [facility] at [facility] [phone] 12/11/2019 11:00 AM

## 2019-12-11 NOTE — Progress Notes (Signed)
Patient stated that he had not been feeling well due to nausea and vomiting for the past 3-4 days. Patient stated that he is just very tired and weak and no energy to do anything. Patient stated that it has been very hard caring for his wife on top of everything else.

## 2019-12-12 ENCOUNTER — Other Ambulatory Visit: Payer: Self-pay

## 2019-12-12 ENCOUNTER — Ambulatory Visit
Admission: RE | Admit: 2019-12-12 | Discharge: 2019-12-12 | Disposition: A | Discharge status: Home / Self Care | Payer: Medicare Other | Source: Ambulatory Visit | Attending: Radiation Oncology | Admitting: Radiation Oncology

## 2019-12-12 DIAGNOSIS — Z510 Encounter for antineoplastic radiation therapy: Secondary | ICD-10-CM | POA: Diagnosis not present

## 2019-12-15 ENCOUNTER — Ambulatory Visit
Admission: RE | Admit: 2019-12-15 | Discharge: 2019-12-15 | Disposition: A | Discharge status: Home / Self Care | Payer: Medicare Other | Source: Ambulatory Visit | Attending: Radiation Oncology | Admitting: Radiation Oncology

## 2019-12-15 ENCOUNTER — Other Ambulatory Visit: Payer: Self-pay

## 2019-12-15 DIAGNOSIS — Z510 Encounter for antineoplastic radiation therapy: Secondary | ICD-10-CM | POA: Diagnosis not present

## 2019-12-16 ENCOUNTER — Ambulatory Visit
Admission: RE | Admit: 2019-12-16 | Discharge: 2019-12-16 | Disposition: A | Discharge status: Home / Self Care | Payer: Medicare Other | Source: Ambulatory Visit | Attending: Radiation Oncology | Admitting: Radiation Oncology

## 2019-12-16 ENCOUNTER — Inpatient Hospital Stay: Payer: Medicare Other

## 2019-12-16 ENCOUNTER — Other Ambulatory Visit: Payer: Self-pay

## 2019-12-16 DIAGNOSIS — Z510 Encounter for antineoplastic radiation therapy: Secondary | ICD-10-CM | POA: Diagnosis not present

## 2019-12-17 ENCOUNTER — Other Ambulatory Visit: Payer: Self-pay

## 2019-12-17 ENCOUNTER — Ambulatory Visit
Admission: RE | Admit: 2019-12-17 | Discharge: 2019-12-17 | Disposition: A | Discharge status: Home / Self Care | Payer: Medicare Other | Source: Ambulatory Visit | Attending: Radiation Oncology | Admitting: Radiation Oncology

## 2019-12-17 ENCOUNTER — Other Ambulatory Visit: Payer: Self-pay | Admitting: Oncology

## 2019-12-17 DIAGNOSIS — C3412 Malignant neoplasm of upper lobe, left bronchus or lung: Secondary | ICD-10-CM

## 2019-12-17 DIAGNOSIS — Z510 Encounter for antineoplastic radiation therapy: Secondary | ICD-10-CM | POA: Diagnosis not present

## 2019-12-17 MED ORDER — LORAZEPAM 0.5 MG PO TABS: 0.5000 mg | ORAL_TABLET | Freq: Four times a day (QID) | ORAL | 0 refills | Status: DC | PRN

## 2019-12-17 NOTE — Telephone Encounter (Signed)
Patient called cancer center requesting refill of Ativan 0.5mg  q 6 hours prn for anxiety.   As mandated by the [place] STOP Act (Strengthen Opioid Misuse Prevention), the [place] Controlled Substance Reporting System ([place]) was reviewed for this patient. Below is the past 39-months of controlled substance prescriptions as displayed by the registry. I have personally consulted with my supervising physician, Dr. Grayland Ormond, who agrees that continuation of therapy is medically appropriate at this time and agrees to provide continual monitoring, including urine/blood drug screens, as indicated. Refill is appropriate on or after 12/16/19.  [place] reviewed: Reviewed and ok to refill    Faythe Casa, NP 12/17/2019 1:08 PM [phone]

## 2019-12-18 ENCOUNTER — Other Ambulatory Visit: Payer: Self-pay

## 2019-12-18 ENCOUNTER — Inpatient Hospital Stay: Payer: Medicare Other

## 2019-12-18 ENCOUNTER — Ambulatory Visit
Admission: RE | Admit: 2019-12-18 | Discharge: 2019-12-18 | Disposition: A | Discharge status: Home / Self Care | Payer: Medicare Other | Source: Ambulatory Visit | Attending: Radiation Oncology | Admitting: Radiation Oncology

## 2019-12-18 VITALS — Resp 20 | Wt 250.4 lb

## 2019-12-18 VITALS — BP 117/80 | HR 87 | Temp 96.0°F | Resp 21

## 2019-12-18 DIAGNOSIS — C3412 Malignant neoplasm of upper lobe, left bronchus or lung: Secondary | ICD-10-CM

## 2019-12-18 DIAGNOSIS — Z5111 Encounter for antineoplastic chemotherapy: Secondary | ICD-10-CM | POA: Diagnosis not present

## 2019-12-18 DIAGNOSIS — Z95828 Presence of other vascular implants and grafts: Secondary | ICD-10-CM

## 2019-12-18 DIAGNOSIS — Z510 Encounter for antineoplastic radiation therapy: Secondary | ICD-10-CM | POA: Diagnosis not present

## 2019-12-18 LAB — COMPREHENSIVE METABOLIC PANEL
ALT: 20 U/L (ref 0–44)
AST: 20 U/L (ref 15–41)
Albumin: 3.7 g/dL (ref 3.5–5.0)
Alkaline Phosphatase: 69 U/L (ref 38–126)
Anion gap: 11 (ref 5–15)
BUN: 19 mg/dL (ref 8–23)
CO2: 22 mmol/L (ref 22–32)
Calcium: 8.9 mg/dL (ref 8.9–10.3)
Chloride: 101 mmol/L (ref 98–111)
Creatinine, Ser: 0.76 mg/dL (ref 0.61–1.24)
GFR calc Af Amer: >60 mL/min (ref >60)
GFR calc non Af Amer: >60 mL/min (ref >60)
Glucose, Bld: 183 mg/dL — ABNORMAL HIGH (ref 70–99)
Potassium: 4 mmol/L (ref 3.5–5.1)
Sodium: 134 mmol/L — ABNORMAL LOW (ref 135–145)
Total Bilirubin: 0.4 mg/dL (ref 0.3–1.2)
Total Protein: 6.5 g/dL (ref 6.5–8.1)

## 2019-12-18 LAB — CBC WITH DIFFERENTIAL/PLATELET
Abs Immature Granulocytes: 0.06 10*3/uL (ref 0.00–0.07)
Basophils Absolute: 0 10*3/uL (ref 0.0–0.1)
Basophils Relative: 1 %
Eosinophils Absolute: 0 10*3/uL (ref 0.0–0.5)
Eosinophils Relative: 1 %
HCT: 39.2 % (ref 39.0–52.0)
Hemoglobin: 13.5 g/dL (ref 13.0–17.0)
Immature Granulocytes: 1 %
Lymphocytes Relative: 10 %
Lymphs Abs: 0.4 10*3/uL — ABNORMAL LOW (ref 0.7–4.0)
MCH: 31.1 pg (ref 26.0–34.0)
MCHC: 34.4 g/dL (ref 30.0–36.0)
MCV: 90.3 fL (ref 80.0–100.0)
Monocytes Absolute: 0.5 10*3/uL (ref 0.1–1.0)
Monocytes Relative: 12 %
Neutro Abs: 3.2 10*3/uL (ref 1.7–7.7)
Neutrophils Relative %: 75 %
Platelets: 172 10*3/uL (ref 150–400)
RBC: 4.34 MIL/uL (ref 4.22–5.81)
RDW: 13.7 % (ref 11.5–15.5)
WBC: 4.3 10*3/uL (ref 4.0–10.5)
nRBC: 0 % (ref 0.0–0.2)

## 2019-12-18 MED ORDER — SODIUM CHLORIDE 0.9% FLUSH
10.0000 mL | Freq: Once | INTRAVENOUS | Status: AC
  Administered 2019-12-18: 08:00:00 10 mL via INTRAVENOUS
  Filled 2019-12-18: qty 10

## 2019-12-18 MED ORDER — DIPHENHYDRAMINE HCL 50 MG/ML IJ SOLN
50.0000 mg | Freq: Once | INTRAMUSCULAR | Status: AC
  Administered 2019-12-18: 50 mg via INTRAVENOUS
  Filled 2019-12-18: qty 1

## 2019-12-18 MED ORDER — SODIUM CHLORIDE 0.9 % IV SOLN
276.0000 mg | Freq: Once | INTRAVENOUS | Status: AC
  Administered 2019-12-18: 280 mg via INTRAVENOUS
  Filled 2019-12-18: qty 28

## 2019-12-18 MED ORDER — HEPARIN SOD (PORK) LOCK FLUSH 100 UNIT/ML IV SOLN
INTRAVENOUS | Status: AC
  Filled 2019-12-18: qty 5

## 2019-12-18 MED ORDER — FAMOTIDINE IN NACL 20-0.9 MG/50ML-% IV SOLN
20.0000 mg | Freq: Once | INTRAVENOUS | Status: AC
  Administered 2019-12-18: 20 mg via INTRAVENOUS
  Filled 2019-12-18: qty 50

## 2019-12-18 MED ORDER — SODIUM CHLORIDE 0.9 % IV SOLN
20.0000 mg | Freq: Once | INTRAVENOUS | Status: AC
  Administered 2019-12-18: 20 mg via INTRAVENOUS
  Filled 2019-12-18: qty 2

## 2019-12-18 MED ORDER — PALONOSETRON HCL INJECTION 0.25 MG/5ML
0.2500 mg | Freq: Once | INTRAVENOUS | Status: AC
  Administered 2019-12-18: 0.25 mg via INTRAVENOUS
  Filled 2019-12-18: qty 5

## 2019-12-18 MED ORDER — SODIUM CHLORIDE 0.9 % IV SOLN
45.0000 mg/m2 | Freq: Once | INTRAVENOUS | Status: AC
  Administered 2019-12-18: 108 mg via INTRAVENOUS
  Filled 2019-12-18: qty 18

## 2019-12-18 MED ORDER — HEPARIN SOD (PORK) LOCK FLUSH 100 UNIT/ML IV SOLN
500.0000 [IU] | Freq: Once | INTRAVENOUS | Status: AC | PRN
  Administered 2019-12-18: 500 [IU]
  Filled 2019-12-18: qty 5

## 2019-12-18 MED ORDER — SODIUM CHLORIDE 0.9 % IV SOLN
Freq: Once | INTRAVENOUS | Status: AC
  Filled 2019-12-18: qty 250

## 2019-12-22 ENCOUNTER — Ambulatory Visit
Admission: RE | Admit: 2019-12-22 | Discharge: 2019-12-22 | Disposition: A | Discharge status: Home / Self Care | Payer: Medicare Other | Source: Ambulatory Visit | Attending: Radiation Oncology | Admitting: Radiation Oncology

## 2019-12-22 ENCOUNTER — Ambulatory Visit: Payer: Medicare Other | Admitting: Pulmonary Disease

## 2019-12-22 ENCOUNTER — Telehealth: Payer: Self-pay | Admitting: *Deleted

## 2019-12-22 ENCOUNTER — Other Ambulatory Visit: Payer: Self-pay

## 2019-12-22 ENCOUNTER — Other Ambulatory Visit: Payer: Self-pay | Admitting: *Deleted

## 2019-12-22 ENCOUNTER — Encounter: Payer: Self-pay | Admitting: Oncology

## 2019-12-22 ENCOUNTER — Inpatient Hospital Stay (HOSPITAL_BASED_OUTPATIENT_CLINIC_OR_DEPARTMENT_OTHER): Payer: Medicare Other | Admitting: Oncology

## 2019-12-22 VITALS — BP 99/61 | HR 72 | Temp 95.9°F | Resp 16 | Wt 245.0 lb

## 2019-12-22 DIAGNOSIS — R040 Epistaxis: Secondary | ICD-10-CM

## 2019-12-22 DIAGNOSIS — Z510 Encounter for antineoplastic radiation therapy: Secondary | ICD-10-CM | POA: Diagnosis not present

## 2019-12-22 DIAGNOSIS — K208 Other esophagitis without bleeding: Secondary | ICD-10-CM

## 2019-12-22 DIAGNOSIS — T66XXXA Radiation sickness, unspecified, initial encounter: Secondary | ICD-10-CM

## 2019-12-22 DIAGNOSIS — C3412 Malignant neoplasm of upper lobe, left bronchus or lung: Secondary | ICD-10-CM | POA: Diagnosis not present

## 2019-12-22 DIAGNOSIS — I2510 Atherosclerotic heart disease of native coronary artery without angina pectoris: Secondary | ICD-10-CM | POA: Diagnosis not present

## 2019-12-22 DIAGNOSIS — Z5111 Encounter for antineoplastic chemotherapy: Secondary | ICD-10-CM | POA: Diagnosis not present

## 2019-12-22 MED ORDER — MORPHINE SULFATE 15 MG PO TABS: 15.0000 mg | ORAL_TABLET | ORAL | 0 refills | Status: DC | PRN

## 2019-12-22 NOTE — Progress Notes (Signed)
Pt states that when he swallows water at room temperature-it is like swallowing glass. He can tolerate when he is not swallowing something. He uses the carafate but it does not help. He has omeprazole but does not help.he gets a lot of belching now.

## 2019-12-22 NOTE — Telephone Encounter (Signed)
You can add him to my list at 1.45 pm. No labs

## 2019-12-22 NOTE — Telephone Encounter (Signed)
Patient accepts appointment at 145 to see [name]

## 2019-12-22 NOTE — Telephone Encounter (Signed)
Patient called reporting that he has been having blood coming from his nose for 2 days. When I returned his call for more information, he reports that for 3 days when he puts a tissue up his nose, it has blood on it. It is not dripping out. Then he reported that his esophagus and throat are burning and that the pain medicine (Roxanol) he was given is not helping. He has an appointment for radiation therapy at 215 today. Please advise if he needs to be seen by [name] or with [facility]

## 2019-12-23 ENCOUNTER — Other Ambulatory Visit: Payer: Self-pay

## 2019-12-23 ENCOUNTER — Ambulatory Visit
Admission: RE | Admit: 2019-12-23 | Discharge: 2019-12-23 | Disposition: A | Discharge status: Home / Self Care | Payer: Medicare Other | Source: Ambulatory Visit | Attending: Radiation Oncology | Admitting: Radiation Oncology

## 2019-12-23 DIAGNOSIS — Z510 Encounter for antineoplastic radiation therapy: Secondary | ICD-10-CM | POA: Diagnosis not present

## 2019-12-24 ENCOUNTER — Telehealth: Payer: Self-pay | Admitting: Pharmacy Technician

## 2019-12-24 ENCOUNTER — Other Ambulatory Visit: Payer: Self-pay

## 2019-12-24 ENCOUNTER — Ambulatory Visit
Admission: RE | Admit: 2019-12-24 | Discharge: 2019-12-24 | Disposition: A | Discharge status: Home / Self Care | Payer: Medicare Other | Source: Ambulatory Visit | Attending: Radiation Oncology | Admitting: Radiation Oncology

## 2019-12-24 DIAGNOSIS — Z510 Encounter for antineoplastic radiation therapy: Secondary | ICD-10-CM | POA: Diagnosis not present

## 2019-12-24 NOTE — Telephone Encounter (Signed)
Oral Oncology Patient Advocate Encounter  Dispensed samples to patient:  Medication: Xarelto 20mg  Instructions: Take 1 tablet (20 mg total) by mouth daily with supper. Quantity dispensed: 21 Days supply: 21 Manufacturer: [employer] Lot: [id number] Exp: 05/2020 Rph Verified: [place] Patient [place] Phone [phone] Fax [phone] 12/24/2019 3:52 PM

## 2019-12-24 NOTE — Progress Notes (Signed)
Hematology/Oncology Consult note [facility]  [phone] [phone]  Patient Care Team: Center, [facility] as PCP - General ([place]) Telford Nab, RN as Registered Nurse   Name of the patient: Derrick Gallegos  [id number]  01/16/1951   Date of visit: 12/24/19  Diagnosis-  Stage IIIC SCC lung [id number]  Chief complaint/ Reason for visit-on treatment assessment prior to cycle 6 of weekly carbotaxol chemotherapy and acute visit for ongoing difficulty swallowing  Heme/Onc history: patient is a 68 year old malewho was a former smoker and smoked about 2 to 3 packs/day for many years and quit smoking a few years ago. He presented to the ER with symptoms of cough and shortness of breathWhich prompted [place] chest x-ray followed by a CT scan. CT scan showed a 3.4 x 3.2 cm left perihilar mass along the left paratracheal precarinal and subcarinal vascular and left hilar adenopathy. Patient has been referred for further management.  PET CT scan showed a large left upper lobe lung mass measuring 6.3 x 3.4 cm. Bilateral mediastinal adenopathy as well as left supraclavicular adenopathy. 3.4 cm partially exophytic lesion of the right kidney. No evidence of distant metastatic disease.  Cycle 1 of weekly carbotaxol chemotherapy with radiation started on 11/17/2019   Interval history-patient reports burning sensation when he swallows food which is getting increasingly worse over the last few days.  He is already taking Carafate.  States that the lower dose of morphine liquid and oxycodone has not been helping.  Reports occasional nosebleeds which have resolved with conservative measures.  ECOG PS- 1 Pain scale- 0 Opioid associated constipation- no  Review of systems- Review of Systems  Constitutional: Positive for malaise/fatigue.  HENT:       Nosebleed  Gastrointestinal:       Dysphagia      No Known Allergies    Past Medical History:  Diagnosis Date  . Anxiety   . Arthritis   . Atrial flutter ([place])    a. diagnosed 5/19; b. s/p TEE/DCCV 05/2018 by Dr. Humphrey Rolls; c. CHADS2VASc => 4 (HTN, age x1, DM, vascular disease)  . Complication of anesthesia    "wakes up too early"  . Coronary artery disease    a. s/p 3-v cabg in 2004 at [place] (LIMA and radial arery used - no further details in Care Everywhere); b. LHC 8/19 underlying multi-V dz, patent LIMA-LAD, occluded VG-PDA, 90-95% stenosis of LCx s/p PCI/DES x 2  . Cough   . Depression   . Diabetes mellitus with complication ([place])   . Dyspnea   . Dysrhythmia    atrial fib.  . Essential hypertension   . Family history of premature CAD    a. father passed away from an MI at 44  . GERD (gastroesophageal reflux disease)   . Heart murmur   . HLD (hyperlipidemia)    a. statin intolerant   . Mass of left lung   . MI (myocardial infarction) ([place])    2004  . Obesity      Past Surgical History:  Procedure Laterality Date  . CARDIAC CATHETERIZATION  2004  . CARDIAC CATHETERIZATION  2019   x2 stents [facility]  . CARDIAC SURGERY    . CARDIOVERSION N/A 06/20/2018   Procedure: CARDIOVERSION;  Surgeon: Dionisio David, MD;  Location: [facility] ORS;  Service: Cardiovascular;  Laterality: N/A;  . CORONARY ARTERY BYPASS GRAFT    . CORONARY STENT INTERVENTION N/A 08/08/2018   Procedure: CORONARY STENT INTERVENTION;  Surgeon: Isaias Cowman, MD;  Location: [place] [facility];  Service: Cardiovascular;  Laterality: N/A;  . LEFT HEART CATH AND CORONARY ANGIOGRAPHY Left 08/08/2018   Procedure: LEFT HEART CATH AND CORONARY ANGIOGRAPHY;  Surgeon: Dionisio David, MD;  Location: [place] [facility];  Service: Cardiovascular;  Laterality: Left;  . PORTA CATH INSERTION N/A 11/10/2019   Procedure: PORTA CATH INSERTION;  Surgeon: Algernon Huxley, MD;  Location: [place] [facility];  Service: Cardiovascular;  Laterality: N/A;  . TEE WITHOUT CARDIOVERSION N/A 06/20/2018    Procedure: TRANSESOPHAGEAL ECHOCARDIOGRAM (TEE);  Surgeon: Dionisio David, MD;  Location: [facility] ORS;  Service: Cardiovascular;  Laterality: N/A;  . VIDEO BRONCHOSCOPY N/A 10/29/2019   Procedure: VIDEO BRONCHOSCOPY WITH ENDOBRONCHICAL ULTRASOUND;  Surgeon: Tyler Pita, MD;  Location: [facility] ORS;  Service: Cardiopulmonary;  Laterality: N/A;    Social History   Socioeconomic History  . Marital status: Married    Spouse name: Not on file  . Number of children: Not on file  . Years of education: Not on file  . Highest education level: Not on file  Occupational History  . Not on file  Tobacco Use  . Smoking status: Former Smoker    Types: Cigarettes    Quit date: 2004    Years since quitting: 17.0  . Smokeless tobacco: Current User  . Tobacco comment: Quit 2004  Substance and Sexual Activity  . Alcohol use: No  . Drug use: No  . Sexual activity: Not Currently  Other Topics Concern  . Not on file  Social History Narrative  . Not on file   Social Determinants of Health   Financial Resource Strain:   . Difficulty of Paying Living Expenses: Not on file  Food Insecurity:   . Worried About Charity fundraiser in the Last Year: Not on file  . Ran Out of Food in the Last Year: Not on file  Transportation Needs:   . Lack of Transportation (Medical): Not on file  . Lack of Transportation (Non-Medical): Not on file  Physical Activity:   . Days of Exercise per Week: Not on file  . Minutes of Exercise per Session: Not on file  Stress:   . Feeling of Stress : Not on file  Social Connections:   . Frequency of Communication with Friends and Family: Not on file  . Frequency of Social Gatherings with Friends and Family: Not on file  . Attends Religious Services: Not on file  . Active Member of Clubs or Organizations: Not on file  . Attends Archivist Meetings: Not on file  . Marital Status: Not on file  Intimate Partner Violence:   . Fear of Current or Ex-Partner: Not on  file  . Emotionally Abused: Not on file  . Physically Abused: Not on file  . Sexually Abused: Not on file    Family History  Problem Relation Age of Onset  . CAD Mother   . CAD Father        a. MI at 57-->death  . CAD Brother      Current Outpatient Medications:  .  aspirin EC 81 MG tablet, Take 1 tablet (81 mg total) by mouth daily., Disp: 90 tablet, Rfl: 3 .  dexamethasone (DECADRON) 4 MG tablet, Take 2 tablets (8 mg total) by mouth daily. Start the day after chemotherapy for 2 days., Disp: 30 tablet, Rfl: 1 .  empagliflozin (JARDIANCE) 25 MG TABS tablet, Take 25 mg by mouth daily., Disp: ,  Rfl:  .  gabapentin (NEURONTIN) 300 MG capsule, Take 300 mg by mouth at bedtime. , Disp: , Rfl:  .  glipiZIDE (GLUCOTROL) 10 MG tablet, Take 10 mg by mouth 2 (two) times daily before a meal., Disp: , Rfl:  .  Glycopyrrolate-Formoterol (BEVESPI AEROSPHERE) 9-4.8 MCG/ACT AERO, Inhale 2 puffs into the lungs 2 (two) times daily., Disp: 10.7 g, Rfl: 11 .  HYDROcodone-homatropine (HYCODAN) 5-1.5 MG/5ML syrup, Take 5 mLs by mouth every 6 (six) hours as needed for cough., Disp: 120 mL, Rfl: 0 .  lidocaine-prilocaine (EMLA) cream, Apply to affected area once, Disp: 30 g, Rfl: 3 .  LORazepam (ATIVAN) 0.5 MG tablet, Take 1 tablet (0.5 mg total) by mouth every 6 (six) hours as needed for anxiety., Disp: 30 tablet, Rfl: 0 .  losartan (COZAAR) 50 MG tablet, Take 50 mg by mouth daily. , Disp: , Rfl:  .  metoprolol tartrate (LOPRESSOR) 25 MG tablet, Take 1 tablet (25 mg total) by mouth 2 (two) times daily., Disp: 180 tablet, Rfl: 0 .  Omega-3 Fatty Acids (FISH OIL) 1200 MG CAPS, Take 2,400 mg by mouth at bedtime. , Disp: , Rfl:  .  omeprazole (PRILOSEC) 20 MG capsule, Take 1 capsule by mouth as needed., Disp: , Rfl:  .  ondansetron (ZOFRAN) 8 MG tablet, Take 1 tablet (8 mg total) by mouth 2 (two) times daily as needed for refractory nausea / vomiting. Start on day 3 after chemo., Disp: 30 tablet, Rfl: 1 .   oxyCODONE (OXY IR/ROXICODONE) 5 MG immediate release tablet, Take 1 tablet (5 mg total) by mouth every 4 (four) hours as needed for severe pain., Disp: 180 tablet, Rfl: 0 .  prochlorperazine (COMPAZINE) 10 MG tablet, Take 1 tablet (10 mg total) by mouth every 6 (six) hours as needed (Nausea or vomiting)., Disp: 30 tablet, Rfl: 1 .  sucralfate (CARAFATE) 1 g tablet, Take 1 tablet (1 g total) by mouth 3 (three) times daily before meals. Dissolve tablet in warm water, swish and swallow, Disp: 90 tablet, Rfl: 6 .  acetaminophen (TYLENOL) 500 MG tablet, Take 1,000 mg by mouth every 6 (six) hours as needed for moderate pain. , Disp: , Rfl:  .  morphine (MSIR) 15 MG tablet, Take 1 tablet (15 mg total) by mouth every 4 (four) hours as needed for severe pain., Disp: 120 tablet, Rfl: 0 .  rivaroxaban (XARELTO) 20 MG TABS tablet, Take 1 tablet (20 mg total) by mouth daily with supper. (Patient not taking: Reported on 12/22/2019), Disp: 90 tablet, Rfl: 2 .  sertraline (ZOLOFT) 25 MG tablet, Take 1 tablet (25 mg total) by mouth daily. (Patient not taking: Reported on 12/22/2019), Disp: 90 tablet, Rfl: 0  Physical exam: There were no vitals filed for this visit. Physical Exam HENT:     Head: Normocephalic and atraumatic.     Mouth/Throat:     Mouth: Mucous membranes are moist.     Pharynx: Oropharynx is clear.  Eyes:     Pupils: Pupils are equal, round, and reactive to light.  Cardiovascular:     Rate and Rhythm: Normal rate and regular rhythm.     Heart sounds: Normal heart sounds.  Pulmonary:     Effort: Pulmonary effort is normal.     Breath sounds: Normal breath sounds.  Abdominal:     General: Bowel sounds are normal.     Palpations: Abdomen is soft.  Musculoskeletal:     Cervical back: Normal range of motion.  Skin:  General: Skin is warm and dry.  Neurological:     Mental Status: He is alert and oriented to person, place, and time.      CMP Latest Ref Rng & Units 12/18/2019  Glucose 70  - 99 mg/dL 183(H)  BUN 8 - 23 mg/dL 19  Creatinine 0.61 - 1.24 mg/dL 0.76  Sodium 135 - 145 mmol/L 134(L)  Potassium 3.5 - 5.1 mmol/L 4.0  Chloride 98 - 111 mmol/L 101  CO2 22 - 32 mmol/L 22  Calcium 8.9 - 10.3 mg/dL 8.9  Total Protein 6.5 - 8.1 g/dL 6.5  Total Bilirubin 0.3 - 1.2 mg/dL 0.4  Alkaline Phos 38 - 126 U/L 69  AST 15 - 41 U/L 20  ALT 0 - 44 U/L 20   CBC Latest Ref Rng & Units 12/18/2019  WBC 4.0 - 10.5 K/uL 4.3  Hemoglobin 13.0 - 17.0 g/dL 13.5  Hematocrit 39.0 - 52.0 % 39.2  Platelets 150 - 400 K/uL 172     Assessment and plan- Patient is a 68 y.o. 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  He is here for on treatment assessment prior to cycle 5 of weekly carbotaxol chemotherapy and for the following issues:  1.  Epistaxis: Suspect this is secondary to dry nose.  Recommended trying over-the-counter saline spray as well as using a humidifier at home.  Continue to monitor  2.  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  I have asked him to take omeprazole 20 mg on a daily basis in addition to that.  I have asked him to stop his oral oxycodone and morphine liquid and switched him to morphine IR 15 mg every 4 hours as needed.  3.  Patient will directly proceed for cycle 5 of weekly carbotaxol chemotherapy on 12/25/2019.  I will see him back in 1 week's time with labs and treatment cycle 6  Visit Diagnosis 1. Malignant neoplasm of upper lobe of left lung (HCC)   2. Radiation esophagitis   3. Epistaxis      [name], MD, MPH [facility] [phone] 12/24/2019 9:33 AM

## 2019-12-25 ENCOUNTER — Telehealth: Payer: Self-pay | Admitting: *Deleted

## 2019-12-25 ENCOUNTER — Other Ambulatory Visit: Payer: Self-pay | Admitting: Oncology

## 2019-12-25 ENCOUNTER — Inpatient Hospital Stay: Payer: Medicare Other | Admitting: Oncology

## 2019-12-25 ENCOUNTER — Inpatient Hospital Stay: Payer: Medicare Other

## 2019-12-25 ENCOUNTER — Ambulatory Visit
Admission: RE | Admit: 2019-12-25 | Discharge: 2019-12-25 | Disposition: A | Discharge status: Home / Self Care | Payer: Medicare Other | Source: Ambulatory Visit | Attending: Radiation Oncology | Admitting: Radiation Oncology

## 2019-12-25 VITALS — BP 119/81 | HR 84 | Resp 19

## 2019-12-25 DIAGNOSIS — Z5111 Encounter for antineoplastic chemotherapy: Secondary | ICD-10-CM | POA: Diagnosis not present

## 2019-12-25 DIAGNOSIS — Z510 Encounter for antineoplastic radiation therapy: Secondary | ICD-10-CM | POA: Diagnosis not present

## 2019-12-25 DIAGNOSIS — C3412 Malignant neoplasm of upper lobe, left bronchus or lung: Secondary | ICD-10-CM

## 2019-12-25 DIAGNOSIS — Z95828 Presence of other vascular implants and grafts: Secondary | ICD-10-CM

## 2019-12-25 LAB — COMPREHENSIVE METABOLIC PANEL
ALT: 20 U/L (ref 0–44)
AST: 20 U/L (ref 15–41)
Albumin: 3.7 g/dL (ref 3.5–5.0)
Alkaline Phosphatase: 63 U/L (ref 38–126)
Anion gap: 11 (ref 5–15)
BUN: 18 mg/dL (ref 8–23)
CO2: 21 mmol/L — ABNORMAL LOW (ref 22–32)
Calcium: 8.5 mg/dL — ABNORMAL LOW (ref 8.9–10.3)
Chloride: 100 mmol/L (ref 98–111)
Creatinine, Ser: 0.61 mg/dL (ref 0.61–1.24)
GFR calc Af Amer: >60 mL/min (ref >60)
GFR calc non Af Amer: >60 mL/min (ref >60)
Glucose, Bld: 189 mg/dL — ABNORMAL HIGH (ref 70–99)
Potassium: 3.7 mmol/L (ref 3.5–5.1)
Sodium: 132 mmol/L — ABNORMAL LOW (ref 135–145)
Total Bilirubin: 0.6 mg/dL (ref 0.3–1.2)
Total Protein: 6.5 g/dL (ref 6.5–8.1)

## 2019-12-25 LAB — CBC WITH DIFFERENTIAL/PLATELET
Abs Immature Granulocytes: 0.05 10*3/uL (ref 0.00–0.07)
Basophils Absolute: 0 10*3/uL (ref 0.0–0.1)
Basophils Relative: 1 %
Eosinophils Absolute: 0 10*3/uL (ref 0.0–0.5)
Eosinophils Relative: 1 %
HCT: 36.9 % — ABNORMAL LOW (ref 39.0–52.0)
Hemoglobin: 12.3 g/dL — ABNORMAL LOW (ref 13.0–17.0)
Immature Granulocytes: 2 %
Lymphocytes Relative: 13 %
Lymphs Abs: 0.4 10*3/uL — ABNORMAL LOW (ref 0.7–4.0)
MCH: 30.2 pg (ref 26.0–34.0)
MCHC: 33.3 g/dL (ref 30.0–36.0)
MCV: 90.7 fL (ref 80.0–100.0)
Monocytes Absolute: 0.4 10*3/uL (ref 0.1–1.0)
Monocytes Relative: 15 %
Neutro Abs: 1.9 10*3/uL (ref 1.7–7.7)
Neutrophils Relative %: 68 %
Platelets: 153 10*3/uL (ref 150–400)
RBC: 4.07 MIL/uL — ABNORMAL LOW (ref 4.22–5.81)
RDW: 14.5 % (ref 11.5–15.5)
WBC: 2.8 10*3/uL — ABNORMAL LOW (ref 4.0–10.5)
nRBC: 0 % (ref 0.0–0.2)

## 2019-12-25 MED ORDER — SODIUM CHLORIDE 0.9 % IV SOLN
45.0000 mg/m2 | Freq: Once | INTRAVENOUS | Status: AC
  Administered 2019-12-25: 108 mg via INTRAVENOUS
  Filled 2019-12-25: qty 18

## 2019-12-25 MED ORDER — SODIUM CHLORIDE 0.9 % IV SOLN
Freq: Once | INTRAVENOUS | Status: AC
  Filled 2019-12-25: qty 250

## 2019-12-25 MED ORDER — SODIUM CHLORIDE 0.9 % IV SOLN
20.0000 mg | Freq: Once | INTRAVENOUS | Status: AC
  Administered 2019-12-25: 20 mg via INTRAVENOUS
  Filled 2019-12-25: qty 2

## 2019-12-25 MED ORDER — PALONOSETRON HCL INJECTION 0.25 MG/5ML
0.2500 mg | Freq: Once | INTRAVENOUS | Status: AC
  Administered 2019-12-25: 0.25 mg via INTRAVENOUS
  Filled 2019-12-25: qty 5

## 2019-12-25 MED ORDER — FAMOTIDINE IN NACL 20-0.9 MG/50ML-% IV SOLN
20.0000 mg | Freq: Once | INTRAVENOUS | Status: AC
  Administered 2019-12-25: 10:00:00 20 mg via INTRAVENOUS
  Filled 2019-12-25: qty 50

## 2019-12-25 MED ORDER — SODIUM CHLORIDE 0.9% FLUSH
10.0000 mL | Freq: Once | INTRAVENOUS | Status: AC
  Administered 2019-12-25: 09:00:00 10 mL via INTRAVENOUS
  Filled 2019-12-25: qty 10

## 2019-12-25 MED ORDER — HEPARIN SOD (PORK) LOCK FLUSH 100 UNIT/ML IV SOLN
500.0000 [IU] | Freq: Once | INTRAVENOUS | Status: AC | PRN
  Administered 2019-12-25: 500 [IU]
  Filled 2019-12-25: qty 5

## 2019-12-25 MED ORDER — SODIUM CHLORIDE 0.9 % IV SOLN
276.0000 mg | Freq: Once | INTRAVENOUS | Status: AC
  Administered 2019-12-25: 280 mg via INTRAVENOUS
  Filled 2019-12-25: qty 28

## 2019-12-25 MED ORDER — DIPHENHYDRAMINE HCL 50 MG/ML IJ SOLN
50.0000 mg | Freq: Once | INTRAMUSCULAR | Status: AC
  Administered 2019-12-25: 50 mg via INTRAVENOUS
  Filled 2019-12-25: qty 1

## 2019-12-25 MED ORDER — HEPARIN SOD (PORK) LOCK FLUSH 100 UNIT/ML IV SOLN
INTRAVENOUS | Status: AC
  Filled 2019-12-25: qty 5

## 2019-12-25 NOTE — Telephone Encounter (Signed)
Called pt and got his voicemail. I said that his wbc was good enough to get his treatment today but not high enough to cont. The treatment without help with injections to boost wbc go up. We have made an appt for mon after radiation at 1:45 to get injection in the lab and then tues 1:45 another injection and then wed have lab at 1:15 and then poss. Injection if needed. Please call if you have questions and left my direct number

## 2019-12-29 ENCOUNTER — Ambulatory Visit
Admission: RE | Admit: 2019-12-29 | Discharge: 2019-12-29 | Disposition: A | Discharge status: Home / Self Care | Payer: Medicare Other | Source: Ambulatory Visit | Attending: Radiation Oncology | Admitting: Radiation Oncology

## 2019-12-29 ENCOUNTER — Other Ambulatory Visit: Payer: Self-pay

## 2019-12-29 ENCOUNTER — Other Ambulatory Visit: Payer: Self-pay | Admitting: *Deleted

## 2019-12-29 ENCOUNTER — Inpatient Hospital Stay: Payer: Medicare Other | Attending: Oncology

## 2019-12-29 DIAGNOSIS — E119 Type 2 diabetes mellitus without complications: Secondary | ICD-10-CM | POA: Diagnosis not present

## 2019-12-29 DIAGNOSIS — E785 Hyperlipidemia, unspecified: Secondary | ICD-10-CM | POA: Diagnosis not present

## 2019-12-29 DIAGNOSIS — Z7689 Persons encountering health services in other specified circumstances: Secondary | ICD-10-CM | POA: Diagnosis not present

## 2019-12-29 DIAGNOSIS — Z510 Encounter for antineoplastic radiation therapy: Secondary | ICD-10-CM | POA: Diagnosis present

## 2019-12-29 DIAGNOSIS — C3412 Malignant neoplasm of upper lobe, left bronchus or lung: Secondary | ICD-10-CM | POA: Diagnosis not present

## 2019-12-29 DIAGNOSIS — Z87891 Personal history of nicotine dependence: Secondary | ICD-10-CM | POA: Insufficient documentation

## 2019-12-29 DIAGNOSIS — K208 Other esophagitis without bleeding: Secondary | ICD-10-CM | POA: Insufficient documentation

## 2019-12-29 DIAGNOSIS — Z7984 Long term (current) use of oral hypoglycemic drugs: Secondary | ICD-10-CM | POA: Insufficient documentation

## 2019-12-29 DIAGNOSIS — Z5111 Encounter for antineoplastic chemotherapy: Secondary | ICD-10-CM | POA: Insufficient documentation

## 2019-12-29 DIAGNOSIS — Z79899 Other long term (current) drug therapy: Secondary | ICD-10-CM | POA: Diagnosis not present

## 2019-12-29 DIAGNOSIS — I252 Old myocardial infarction: Secondary | ICD-10-CM | POA: Insufficient documentation

## 2019-12-29 DIAGNOSIS — Y842 Radiological procedure and radiotherapy as the cause of abnormal reaction of the patient, or of later complication, without mention of misadventure at the time of the procedure: Secondary | ICD-10-CM | POA: Diagnosis not present

## 2019-12-29 DIAGNOSIS — K219 Gastro-esophageal reflux disease without esophagitis: Secondary | ICD-10-CM | POA: Insufficient documentation

## 2019-12-29 DIAGNOSIS — Z23 Encounter for immunization: Secondary | ICD-10-CM

## 2019-12-29 DIAGNOSIS — Z7901 Long term (current) use of anticoagulants: Secondary | ICD-10-CM | POA: Insufficient documentation

## 2019-12-29 DIAGNOSIS — Z7952 Long term (current) use of systemic steroids: Secondary | ICD-10-CM | POA: Insufficient documentation

## 2019-12-29 DIAGNOSIS — R5383 Other fatigue: Secondary | ICD-10-CM | POA: Diagnosis not present

## 2019-12-29 DIAGNOSIS — I10 Essential (primary) hypertension: Secondary | ICD-10-CM | POA: Diagnosis not present

## 2019-12-29 DIAGNOSIS — Z951 Presence of aortocoronary bypass graft: Secondary | ICD-10-CM | POA: Insufficient documentation

## 2019-12-29 DIAGNOSIS — R5381 Other malaise: Secondary | ICD-10-CM | POA: Diagnosis not present

## 2019-12-29 DIAGNOSIS — Z7982 Long term (current) use of aspirin: Secondary | ICD-10-CM | POA: Insufficient documentation

## 2019-12-29 MED ORDER — FILGRASTIM-SNDZ 300 MCG/0.5ML IJ SOSY
300.0000 ug | PREFILLED_SYRINGE | Freq: Every day | INTRAMUSCULAR | Status: DC
  Administered 2019-12-29: 15:00:00 300 ug via SUBCUTANEOUS
  Filled 2019-12-29: qty 0.5

## 2019-12-29 MED ORDER — DEXAMETHASONE 4 MG PO TABS: 4.0000 mg | ORAL_TABLET | Freq: Two times a day (BID) | ORAL | 0 refills | Status: DC

## 2019-12-30 ENCOUNTER — Inpatient Hospital Stay: Payer: Medicare Other

## 2019-12-30 ENCOUNTER — Encounter: Payer: Self-pay | Admitting: Oncology

## 2019-12-30 ENCOUNTER — Ambulatory Visit
Admission: RE | Admit: 2019-12-30 | Discharge: 2019-12-30 | Disposition: A | Discharge status: Home / Self Care | Payer: Medicare Other | Source: Ambulatory Visit | Attending: Radiation Oncology | Admitting: Radiation Oncology

## 2019-12-30 ENCOUNTER — Other Ambulatory Visit: Payer: Self-pay | Admitting: *Deleted

## 2019-12-30 ENCOUNTER — Ambulatory Visit: Payer: Medicare Other

## 2019-12-30 ENCOUNTER — Other Ambulatory Visit: Payer: Self-pay

## 2019-12-30 DIAGNOSIS — C3412 Malignant neoplasm of upper lobe, left bronchus or lung: Secondary | ICD-10-CM

## 2019-12-30 DIAGNOSIS — Z5111 Encounter for antineoplastic chemotherapy: Secondary | ICD-10-CM | POA: Diagnosis not present

## 2019-12-30 DIAGNOSIS — D701 Agranulocytosis secondary to cancer chemotherapy: Secondary | ICD-10-CM

## 2019-12-30 DIAGNOSIS — Z510 Encounter for antineoplastic radiation therapy: Secondary | ICD-10-CM | POA: Diagnosis not present

## 2019-12-30 DIAGNOSIS — Z23 Encounter for immunization: Secondary | ICD-10-CM

## 2019-12-30 DIAGNOSIS — T451X5A Adverse effect of antineoplastic and immunosuppressive drugs, initial encounter: Secondary | ICD-10-CM

## 2019-12-30 MED ORDER — FILGRASTIM-SNDZ 300 MCG/0.5ML IJ SOSY
300.0000 ug | PREFILLED_SYRINGE | Freq: Every day | INTRAMUSCULAR | Status: AC
  Administered 2019-12-30: 300 ug via SUBCUTANEOUS
  Filled 2019-12-30: qty 0.5

## 2019-12-30 NOTE — Telephone Encounter (Signed)
Patient is requesting a refill on this medication.

## 2019-12-31 ENCOUNTER — Encounter: Payer: Self-pay | Admitting: Urology

## 2019-12-31 ENCOUNTER — Inpatient Hospital Stay: Payer: Medicare Other

## 2019-12-31 ENCOUNTER — Ambulatory Visit
Admission: RE | Admit: 2019-12-31 | Discharge: 2019-12-31 | Disposition: A | Discharge status: Home / Self Care | Payer: Medicare Other | Source: Ambulatory Visit | Attending: Radiation Oncology | Admitting: Radiation Oncology

## 2019-12-31 ENCOUNTER — Other Ambulatory Visit: Payer: Self-pay

## 2019-12-31 ENCOUNTER — Ambulatory Visit (INDEPENDENT_AMBULATORY_CARE_PROVIDER_SITE_OTHER): Payer: Medicare Other | Admitting: Urology

## 2019-12-31 VITALS — BP 166/93 | HR 99 | Wt 245.0 lb

## 2019-12-31 DIAGNOSIS — D701 Agranulocytosis secondary to cancer chemotherapy: Secondary | ICD-10-CM

## 2019-12-31 DIAGNOSIS — N2889 Other specified disorders of kidney and ureter: Secondary | ICD-10-CM

## 2019-12-31 DIAGNOSIS — Z510 Encounter for antineoplastic radiation therapy: Secondary | ICD-10-CM | POA: Diagnosis not present

## 2019-12-31 DIAGNOSIS — T451X5A Adverse effect of antineoplastic and immunosuppressive drugs, initial encounter: Secondary | ICD-10-CM

## 2019-12-31 DIAGNOSIS — Z5111 Encounter for antineoplastic chemotherapy: Secondary | ICD-10-CM | POA: Diagnosis not present

## 2019-12-31 DIAGNOSIS — C3412 Malignant neoplasm of upper lobe, left bronchus or lung: Secondary | ICD-10-CM

## 2019-12-31 LAB — CBC WITH DIFFERENTIAL/PLATELET
Abs Immature Granulocytes: 0.67 10*3/uL — ABNORMAL HIGH (ref 0.00–0.07)
Basophils Absolute: 0 10*3/uL (ref 0.0–0.1)
Basophils Relative: 0 %
Eosinophils Absolute: 0 10*3/uL (ref 0.0–0.5)
Eosinophils Relative: 0 %
HCT: 36.7 % — ABNORMAL LOW (ref 39.0–52.0)
Hemoglobin: 12.5 g/dL — ABNORMAL LOW (ref 13.0–17.0)
Immature Granulocytes: 10 %
Lymphocytes Relative: 4 %
Lymphs Abs: 0.3 10*3/uL — ABNORMAL LOW (ref 0.7–4.0)
MCH: 31.2 pg (ref 26.0–34.0)
MCHC: 34.1 g/dL (ref 30.0–36.0)
MCV: 91.5 fL (ref 80.0–100.0)
Monocytes Absolute: 0.3 10*3/uL (ref 0.1–1.0)
Monocytes Relative: 4 %
Neutro Abs: 5.5 10*3/uL (ref 1.7–7.7)
Neutrophils Relative %: 82 %
Platelets: 185 10*3/uL (ref 150–400)
RBC: 4.01 MIL/uL — ABNORMAL LOW (ref 4.22–5.81)
RDW: 15.3 % (ref 11.5–15.5)
WBC: 6.7 10*3/uL (ref 4.0–10.5)
nRBC: 0 % (ref 0.0–0.2)

## 2019-12-31 LAB — COMPREHENSIVE METABOLIC PANEL
ALT: 22 U/L (ref 0–44)
AST: 16 U/L (ref 15–41)
Albumin: 3.9 g/dL (ref 3.5–5.0)
Alkaline Phosphatase: 94 U/L (ref 38–126)
Anion gap: 9 (ref 5–15)
BUN: 17 mg/dL (ref 8–23)
CO2: 24 mmol/L (ref 22–32)
Calcium: 9 mg/dL (ref 8.9–10.3)
Chloride: 100 mmol/L (ref 98–111)
Creatinine, Ser: 0.73 mg/dL (ref 0.61–1.24)
GFR calc Af Amer: >60 mL/min (ref >60)
GFR calc non Af Amer: >60 mL/min (ref >60)
Glucose, Bld: 292 mg/dL — ABNORMAL HIGH (ref 70–99)
Potassium: 4.7 mmol/L (ref 3.5–5.1)
Sodium: 133 mmol/L — ABNORMAL LOW (ref 135–145)
Total Bilirubin: 0.6 mg/dL (ref 0.3–1.2)
Total Protein: 7.1 g/dL (ref 6.5–8.1)

## 2019-12-31 MED ORDER — LORAZEPAM 0.5 MG PO TABS: 0.5000 mg | ORAL_TABLET | Freq: Four times a day (QID) | ORAL | 0 refills | Status: DC | PRN

## 2019-12-31 NOTE — Telephone Encounter (Signed)
Yes I refilled. Thanks

## 2019-12-31 NOTE — Progress Notes (Signed)
12/31/2019 4:14 PM   Derrick Gallegos Jan 05, 1951 [id number]  Referring provider: Center, [place],  Derrick Gallegos [id number]  Chief Complaint  Patient presents with  . Renal Mass    HPI: 69 year old male who presents today for further evaluation of incidental renal mass.  He was recently diagnosed with stage IIIc squamous cell carcinoma of the left lung.  He is undergoing chemotherapy and radiation.  On staging PET scan from 09/2019, there was an incidental finding of possible 3 cm right renal lesion.  MRI was ordered and is scheduled for 01/12/2020.  Otherwise no recent renal imaging.  Abdominal ultrasound 2012 indicated no renal mass.  No history of flank pain.  No history of gross hematuria.  No family history of renal cell malignancy.  He had a hard time with chemotherapy and radiation.  He experienced radiation esophagitis weakness and fatigue related to chemotherapy.  He has 1 more dose of chemotherapy tomorrow.  He still receiving radiation treatments.   PMH: Past Medical History:  Diagnosis Date  . Anxiety   . Arthritis   . Atrial flutter ([place])    a. diagnosed 5/19; b. s/p TEE/DCCV 05/2018 by Dr. Humphrey Rolls; c. CHADS2VASc => 4 (HTN, age x1, DM, vascular disease)  . Complication of anesthesia    "wakes up too early"  . Coronary artery disease    a. s/p 3-v cabg in 2004 at [place] (LIMA and radial arery used - no further details in Care Everywhere); b. LHC 8/19 underlying multi-V dz, patent LIMA-LAD, occluded VG-PDA, 90-95% stenosis of LCx s/p PCI/DES x 2  . Cough   . Depression   . Diabetes mellitus with complication ([place])   . Dyspnea   . Dysrhythmia    atrial fib.  . Essential hypertension   . Family history of premature CAD    a. father passed away from an MI at 22  . GERD (gastroesophageal reflux disease)   . Heart murmur   . HLD (hyperlipidemia)    a. statin intolerant   . Mass of left lung   . MI (myocardial  infarction) ([place])    2004  . Obesity     Surgical History: Past Surgical History:  Procedure Laterality Date  . CARDIAC CATHETERIZATION  2004  . CARDIAC CATHETERIZATION  2019   x2 stents [facility]  . CARDIAC SURGERY    . CARDIOVERSION N/A 06/20/2018   Procedure: CARDIOVERSION;  Surgeon: Dionisio David, MD;  Location: [facility] ORS;  Service: Cardiovascular;  Laterality: N/A;  . CORONARY ARTERY BYPASS GRAFT    . CORONARY STENT INTERVENTION N/A 08/08/2018   Procedure: CORONARY STENT INTERVENTION;  Surgeon: Isaias Cowman, MD;  Location: [place] [facility];  Service: Cardiovascular;  Laterality: N/A;  . LEFT HEART CATH AND CORONARY ANGIOGRAPHY Left 08/08/2018   Procedure: LEFT HEART CATH AND CORONARY ANGIOGRAPHY;  Surgeon: Dionisio David, MD;  Location: [place] [facility];  Service: Cardiovascular;  Laterality: Left;  . PORTA CATH INSERTION N/A 11/10/2019   Procedure: PORTA CATH INSERTION;  Surgeon: Algernon Huxley, MD;  Location: [place] [facility];  Service: Cardiovascular;  Laterality: N/A;  . TEE WITHOUT CARDIOVERSION N/A 06/20/2018   Procedure: TRANSESOPHAGEAL ECHOCARDIOGRAM (TEE);  Surgeon: Dionisio David, MD;  Location: [facility] ORS;  Service: Cardiovascular;  Laterality: N/A;  . VIDEO BRONCHOSCOPY N/A 10/29/2019   Procedure: VIDEO BRONCHOSCOPY WITH ENDOBRONCHICAL ULTRASOUND;  Surgeon: Tyler Pita, MD;  Location: [facility] ORS;  Service: Cardiopulmonary;  Laterality: N/A;  Home Medications:  Allergies as of 12/31/2019   No Known Allergies     Medication List       Accurate as of December 31, 2019  4:14 PM. If you have any questions, ask your nurse or doctor.        acetaminophen 500 MG tablet Commonly known as: TYLENOL Take 1,000 mg by mouth every 6 (six) hours as needed for moderate pain.   aspirin EC 81 MG tablet Take 1 tablet (81 mg total) by mouth daily.   Bevespi Aerosphere 9-4.8 MCG/ACT Aero Generic drug: Glycopyrrolate-Formoterol Inhale 2 puffs into the lungs 2  (two) times daily.   dexamethasone 4 MG tablet Commonly known as: DECADRON Take 2 tablets (8 mg total) by mouth daily. Start the day after chemotherapy for 2 days.   dexamethasone 4 MG tablet Commonly known as: DECADRON Take 1 tablet (4 mg total) by mouth 2 (two) times daily with a meal.   Fish Oil 1200 MG Caps Take 2,400 mg by mouth at bedtime.   gabapentin 300 MG capsule Commonly known as: NEURONTIN Take 300 mg by mouth at bedtime.   glipiZIDE 10 MG tablet Commonly known as: GLUCOTROL Take 10 mg by mouth 2 (two) times daily before a meal.   HYDROcodone-homatropine 5-1.5 MG/5ML syrup Commonly known as: HYCODAN Take 5 mLs by mouth every 6 (six) hours as needed for cough.   Jardiance 25 MG Tabs tablet Generic drug: empagliflozin Take 25 mg by mouth daily.   lidocaine-prilocaine cream Commonly known as: EMLA Apply to affected area once   LORazepam 0.5 MG tablet Commonly known as: ATIVAN Take 1 tablet (0.5 mg total) by mouth every 6 (six) hours as needed for anxiety.   losartan 50 MG tablet Commonly known as: COZAAR Take 50 mg by mouth daily.   metoprolol tartrate 25 MG tablet Commonly known as: LOPRESSOR Take 1 tablet (25 mg total) by mouth 2 (two) times daily.   morphine 15 MG tablet Commonly known as: MSIR Take 1 tablet (15 mg total) by mouth every 4 (four) hours as needed for severe pain.   omeprazole 20 MG capsule Commonly known as: PRILOSEC Take 1 capsule by mouth as needed.   ondansetron 8 MG tablet Commonly known as: Zofran Take 1 tablet (8 mg total) by mouth 2 (two) times daily as needed for refractory nausea / vomiting. Start on day 3 after chemo.   oxyCODONE 5 MG immediate release tablet Commonly known as: Oxy IR/ROXICODONE Take 1 tablet (5 mg total) by mouth every 4 (four) hours as needed for severe pain.   prochlorperazine 10 MG tablet Commonly known as: COMPAZINE Take 1 tablet (10 mg total) by mouth every 6 (six) hours as needed (Nausea or  vomiting).   rivaroxaban 20 MG Tabs tablet Commonly known as: XARELTO Take 1 tablet (20 mg total) by mouth daily with supper.   sertraline 25 MG tablet Commonly known as: ZOLOFT Take 1 tablet (25 mg total) by mouth daily.   sucralfate 1 g tablet Commonly known as: Carafate Take 1 tablet (1 g total) by mouth 3 (three) times daily before meals. Dissolve tablet in warm water, swish and swallow       Allergies: No Known Allergies  Family History: Family History  Problem Relation Age of Onset  . CAD Mother   . CAD Father        a. MI at 57-->death  . CAD Brother     Social History:  reports that he quit smoking about 17  years ago. His smoking use included cigarettes. He uses smokeless tobacco. He reports that he does not drink alcohol or use drugs.  ROS: UROLOGY Frequent Urination?: No Hard to postpone urination?: No Burning/pain with urination?: No Get up at night to urinate?: No Leakage of urine?: No Urine stream starts and stops?: No Trouble starting stream?: No Do you have to strain to urinate?: No Blood in urine?: No Urinary tract infection?: No Sexually transmitted disease?: No Injury to kidneys or bladder?: No Painful intercourse?: No Weak stream?: No Erection problems?: No Penile pain?: No  Gastrointestinal Nausea?: No Vomiting?: No Indigestion/heartburn?: No Diarrhea?: No Constipation?: No  Constitutional Fever: No Night sweats?: Yes Weight loss?: Yes Fatigue?: No  Skin Skin rash/lesions?: No Itching?: No  Eyes Blurred vision?: No Double vision?: No  Ears/Nose/Throat Sore throat?: No Sinus problems?: No  Hematologic/Lymphatic Swollen glands?: No Easy bruising?: No  Cardiovascular Leg swelling?: No Chest pain?: No  Respiratory Cough?: Yes Shortness of breath?: Yes  Endocrine Excessive thirst?: No  Musculoskeletal Back pain?: Yes Joint pain?: Yes  Neurological Headaches?: No Dizziness?: No  Psychologic Depression?:  No Anxiety?: No  Physical Exam: BP (!) 166/93   Pulse 99   Wt 245 lb (111.1 kg)   BMI 34.17 kg/m   Constitutional:  Alert and oriented, No acute distress.  Alopecia related to chemotherapy.  Pale in appearance. HEENT: [place] AT, moist mucus membranes.  Trachea midline, no masses. Cardiovascular: No clubbing, cyanosis, or edema. Respiratory: Normal respiratory effort, no increased work of breathing. Skin: No rashes, bruises or suspicious lesions. Neurologic: Grossly intact, no focal deficits, moving all 4 extremities. Psychiatric: Normal mood and affect.  Laboratory Data: Lab Results  Component Value Date   WBC 6.7 12/31/2019   HGB 12.5 (L) 12/31/2019   HCT 36.7 (L) 12/31/2019   MCV 91.5 12/31/2019   PLT 185 12/31/2019    Lab Results  Component Value Date   CREATININE 0.73 12/31/2019   Lab Results  Component Value Date   HGBA1C 6.4 (H) 11/16/2017    Pertinent Imaging: PET scan from 10/13/2001 personally reviewed.  Agree with radiologic interpretation.  IMPRESSION: 1. The spiculated left upper lobe mass has a maximum SUV of 10.1 compatible with malignancy. There is associated left supraclavicular, ipsilateral and contralateral mediastinal, left hilar, left infrahilar, and subcarinal hypermetabolic adenopathy compatible with nodal spread. No findings of additional involvement of the neck, abdomen/pelvis, or skeleton. 2. A 3.4 cm partially exophytic lesion of the right kidney upper pole appears to have metabolic activity, making it concerning for a independent mass such as renal cell carcinoma. Options for workup may include renal ultrasound or renal protocol MRI with and without contrast. 3. Aortic Atherosclerosis (ICD10-I70.0).  Coronary atherosclerosis.   Electronically Signed   By: Van Clines M.D.   On: 10/14/2019 14:56  Assessment & Plan:    1. Renal mass Incidental possible 3 cm right renal mass on PET scan  Currently undergoing chemo rads for  stage IIIb squamous cell carcinoma of the lung.  We reviewed the above imaging.  We discussed that this is not diagnostic or specific for renal imaging.  Is difficult based on the study to assess whether or not there is a concern for malignancy.  He is scheduled for renal MRI/dedicated renal imaging later this month.  We briefly discussed management of small renal masses.  This could include surveillance, cryotherapy, or partial nephrectomy as treatment option should imaging be suggestive of renal cell carcinoma.  We also discussed his additional comorbidities.  Recommend completion of therapy for lung cancer, restaging as deemed appropriate by Dr. Janese Banks prior to any intervention for his renal mass.   - Urinalysis, Complete   Return in about 4 weeks (around 01/28/2020) for virtual visit.  Hollice Espy, MD  [facility] [address], [place], [place] [address] [phone]

## 2019-12-31 NOTE — Telephone Encounter (Signed)
Patient called cancer center requesting refill of Ativan 0.5mg  q 6 prn for anxiety.   As mandated by the [place] STOP Act (Strengthen Opioid Misuse Prevention), the [place] Controlled Substance Reporting System ([place]) was reviewed for this patient. Below is the past 10-months of controlled substance prescriptions as displayed by the registry. I have personally consulted with my supervising physician, Dr. Janese Banks who agrees that continuation of opiate therapy is medically appropriate at this time and agrees to provide continual monitoring, including urine/blood drug screens, as indicated. Refill is appropriate on or after 12/26/2019.  NCCSRS reviewed: Reviewed and acceptable.    Faythe Casa, NP 12/31/2019 9:19 AM [phone]

## 2020-01-01 ENCOUNTER — Other Ambulatory Visit: Payer: Self-pay

## 2020-01-01 ENCOUNTER — Other Ambulatory Visit: Payer: Self-pay | Admitting: Oncology

## 2020-01-01 ENCOUNTER — Inpatient Hospital Stay: Payer: Medicare Other

## 2020-01-01 ENCOUNTER — Ambulatory Visit
Admission: RE | Admit: 2020-01-01 | Discharge: 2020-01-01 | Disposition: A | Discharge status: Home / Self Care | Payer: Medicare Other | Source: Ambulatory Visit | Attending: Radiation Oncology | Admitting: Radiation Oncology

## 2020-01-01 ENCOUNTER — Encounter: Payer: Self-pay | Admitting: Oncology

## 2020-01-01 ENCOUNTER — Inpatient Hospital Stay (HOSPITAL_BASED_OUTPATIENT_CLINIC_OR_DEPARTMENT_OTHER): Payer: Medicare Other | Admitting: Oncology

## 2020-01-01 VITALS — BP 124/92 | HR 75 | Temp 98.8°F | Resp 18 | Wt 241.0 lb

## 2020-01-01 DIAGNOSIS — C3412 Malignant neoplasm of upper lobe, left bronchus or lung: Secondary | ICD-10-CM

## 2020-01-01 DIAGNOSIS — K208 Other esophagitis without bleeding: Secondary | ICD-10-CM

## 2020-01-01 DIAGNOSIS — T66XXXA Radiation sickness, unspecified, initial encounter: Secondary | ICD-10-CM

## 2020-01-01 DIAGNOSIS — Z510 Encounter for antineoplastic radiation therapy: Secondary | ICD-10-CM | POA: Diagnosis not present

## 2020-01-01 DIAGNOSIS — Z5111 Encounter for antineoplastic chemotherapy: Secondary | ICD-10-CM

## 2020-01-01 MED ORDER — SODIUM CHLORIDE 0.9 % IV SOLN
45.0000 mg/m2 | Freq: Once | INTRAVENOUS | Status: AC
  Administered 2020-01-01: 108 mg via INTRAVENOUS
  Filled 2020-01-01: qty 18

## 2020-01-01 MED ORDER — SODIUM CHLORIDE 0.9 % IV SOLN
20.0000 mg | Freq: Once | INTRAVENOUS | Status: AC
  Administered 2020-01-01: 20 mg via INTRAVENOUS
  Filled 2020-01-01: qty 20

## 2020-01-01 MED ORDER — LORAZEPAM 0.5 MG PO TABS: 0.5000 mg | ORAL_TABLET | Freq: Four times a day (QID) | ORAL | 0 refills | Status: DC | PRN

## 2020-01-01 MED ORDER — SODIUM CHLORIDE 0.9 % IV SOLN
276.0000 mg | Freq: Once | INTRAVENOUS | Status: AC
  Administered 2020-01-01: 280 mg via INTRAVENOUS
  Filled 2020-01-01: qty 28

## 2020-01-01 MED ORDER — DIPHENHYDRAMINE HCL 50 MG/ML IJ SOLN
50.0000 mg | Freq: Once | INTRAMUSCULAR | Status: AC
  Administered 2020-01-01: 50 mg via INTRAVENOUS
  Filled 2020-01-01: qty 1

## 2020-01-01 MED ORDER — HEPARIN SOD (PORK) LOCK FLUSH 100 UNIT/ML IV SOLN
500.0000 [IU] | Freq: Once | INTRAVENOUS | Status: AC | PRN
  Administered 2020-01-01: 500 [IU]
  Filled 2020-01-01: qty 5

## 2020-01-01 MED ORDER — FAMOTIDINE IN NACL 20-0.9 MG/50ML-% IV SOLN
20.0000 mg | Freq: Once | INTRAVENOUS | Status: AC
  Administered 2020-01-01: 20 mg via INTRAVENOUS
  Filled 2020-01-01: qty 50

## 2020-01-01 MED ORDER — SODIUM CHLORIDE 0.9 % IV SOLN
Freq: Once | INTRAVENOUS | Status: AC
  Filled 2020-01-01: qty 250

## 2020-01-01 MED ORDER — HEPARIN SOD (PORK) LOCK FLUSH 100 UNIT/ML IV SOLN
INTRAVENOUS | Status: AC
  Filled 2020-01-01: qty 5

## 2020-01-01 MED ORDER — PALONOSETRON HCL INJECTION 0.25 MG/5ML
0.2500 mg | Freq: Once | INTRAVENOUS | Status: AC
  Administered 2020-01-01: 0.25 mg via INTRAVENOUS
  Filled 2020-01-01: qty 5

## 2020-01-01 NOTE — Progress Notes (Signed)
Still having pain in chest from radiation and chemo effects. He got steroids from [facility] and he is suppose to take steroids the 2 days after treatment-needs to know what to do

## 2020-01-02 ENCOUNTER — Ambulatory Visit
Admission: RE | Admit: 2020-01-02 | Discharge: 2020-01-02 | Disposition: A | Discharge status: Home / Self Care | Payer: Medicare Other | Source: Ambulatory Visit | Attending: Radiation Oncology | Admitting: Radiation Oncology

## 2020-01-02 ENCOUNTER — Telehealth: Payer: Self-pay | Admitting: Pharmacy Technician

## 2020-01-02 ENCOUNTER — Other Ambulatory Visit: Payer: Self-pay

## 2020-01-02 ENCOUNTER — Encounter: Payer: Self-pay | Admitting: Oncology

## 2020-01-02 DIAGNOSIS — Z510 Encounter for antineoplastic radiation therapy: Secondary | ICD-10-CM | POA: Diagnosis not present

## 2020-01-02 LAB — URINALYSIS, COMPLETE
Bilirubin, UA: NEGATIVE
Leukocytes,UA: NEGATIVE
Nitrite, UA: NEGATIVE
Protein,UA: NEGATIVE
Specific Gravity, UA: 1.015 (ref 1.005–1.030)
Urobilinogen, Ur: 0.2 mg/dL (ref 0.2–1.0)
pH, UA: 5 (ref 5.0–7.5)

## 2020-01-02 LAB — MICROSCOPIC EXAMINATION: Bacteria, UA: NONE SEEN

## 2020-01-02 NOTE — Progress Notes (Signed)
Hematology/Oncology Consult note [facility]  [phone] [phone]  Patient Care Team: Center, [facility] as PCP - General ([place]) Telford Nab, RN as Registered Nurse   Name of the patient: Derrick Gallegos  [id number]  Dec 20, 1951   Date of visit: 01/02/20  Diagnosis- Stage IIIC SCC lung [id number]  Chief complaint/ Reason for visit-on treatment assessment prior to cycle 7 of weekly carbotaxol chemotherapy  Heme/Onc history: patient is a 69 year old malewho was a former smoker and smoked about 2 to 3 packs/day for many years and quit smoking a few years ago. He presented to the ER with symptoms of cough and shortness of breathWhich prompted [place] chest x-ray followed by a CT scan. CT scan showed a 3.4 x 3.2 cm left perihilar mass along the left paratracheal precarinal and subcarinal vascular and left hilar adenopathy. Patient has been referred for further management.  PET CT scan showed a large left upper lobe lung mass measuring 6.3 x 3.4 cm. Bilateral mediastinal adenopathy as well as left supraclavicular adenopathy. 3.4 cm partially exophytic lesion of the right kidney. No evidence of distant metastatic disease.  Cycle 1 of weekly carbotaxol chemotherapy with radiation started on 11/17/2019   Interval history-patient continues to have difficulty swallowing and was started on Decadron by radiation oncology.  He is using morphine 15 mg IR every 4 hours for his pain and states that it has been helping him.  ECOG PS- 1 Pain scale- 3 Opioid associated constipation- no  Review of systems- Review of Systems  Constitutional: Positive for malaise/fatigue. Negative for chills, fever and weight loss.  HENT: Negative for congestion, ear discharge and nosebleeds.   Eyes: Negative for blurred vision.  Respiratory: Negative for cough, hemoptysis, sputum production, shortness of breath and wheezing.    Cardiovascular: Negative for chest pain, palpitations, orthopnea and claudication.  Gastrointestinal: Negative for abdominal pain, blood in stool, constipation, diarrhea, heartburn, melena, nausea and vomiting.       Dysphagia  Genitourinary: Negative for dysuria, flank pain, frequency, hematuria and urgency.  Musculoskeletal: Negative for back pain, joint pain and myalgias.  Skin: Negative for rash.  Neurological: Negative for dizziness, tingling, focal weakness, seizures, weakness and headaches.  Endo/Heme/Allergies: Does not bruise/bleed easily.  Psychiatric/Behavioral: Negative for depression and suicidal ideas. The patient does not have insomnia.      No Known Allergies   Past Medical History:  Diagnosis Date  . Anxiety   . Arthritis   . Atrial flutter ([place])    a. diagnosed 5/19; b. s/p TEE/DCCV 05/2018 by Dr. Humphrey Rolls; c. CHADS2VASc => 4 (HTN, age x1, DM, vascular disease)  . Complication of anesthesia    "wakes up too early"  . Coronary artery disease    a. s/p 3-v cabg in 2004 at [facility] (LIMA and radial arery used - no further details in Care Everywhere); b. LHC 8/19 underlying multi-V dz, patent LIMA-LAD, occluded VG-PDA, 90-95% stenosis of LCx s/p PCI/DES x 2  . Cough   . Depression   . Diabetes mellitus with complication ([place])   . Dyspnea   . Dysrhythmia    atrial fib.  . Essential hypertension   . Family history of premature CAD    a. father passed away from an MI at 64  . GERD (gastroesophageal reflux disease)   . Heart murmur   . HLD (hyperlipidemia)    a. statin intolerant   . Lung cancer ([place])   . Mass of left lung   .  MI (myocardial infarction) ([place])    2004  . Obesity      Past Surgical History:  Procedure Laterality Date  . CARDIAC CATHETERIZATION  2004  . CARDIAC CATHETERIZATION  2019   x2 stents [facility]  . CARDIAC SURGERY    . CARDIOVERSION N/A 06/20/2018   Procedure: CARDIOVERSION;  Surgeon: Dionisio David, MD;  Location: [facility] ORS;  Service:  Cardiovascular;  Laterality: N/A;  . CORONARY ARTERY BYPASS GRAFT    . CORONARY STENT INTERVENTION N/A 08/08/2018   Procedure: CORONARY STENT INTERVENTION;  Surgeon: Isaias Cowman, MD;  Location: [place] [facility];  Service: Cardiovascular;  Laterality: N/A;  . LEFT HEART CATH AND CORONARY ANGIOGRAPHY Left 08/08/2018   Procedure: LEFT HEART CATH AND CORONARY ANGIOGRAPHY;  Surgeon: Dionisio David, MD;  Location: [place] [facility];  Service: Cardiovascular;  Laterality: Left;  . PORTA CATH INSERTION N/A 11/10/2019   Procedure: PORTA CATH INSERTION;  Surgeon: Algernon Huxley, MD;  Location: [place] [facility];  Service: Cardiovascular;  Laterality: N/A;  . TEE WITHOUT CARDIOVERSION N/A 06/20/2018   Procedure: TRANSESOPHAGEAL ECHOCARDIOGRAM (TEE);  Surgeon: Dionisio David, MD;  Location: [facility] ORS;  Service: Cardiovascular;  Laterality: N/A;  . VIDEO BRONCHOSCOPY N/A 10/29/2019   Procedure: VIDEO BRONCHOSCOPY WITH ENDOBRONCHICAL ULTRASOUND;  Surgeon: Tyler Pita, MD;  Location: [facility] ORS;  Service: Cardiopulmonary;  Laterality: N/A;    Social History   Socioeconomic History  . Marital status: Married    Spouse name: Not on file  . Number of children: Not on file  . Years of education: Not on file  . Highest education level: Not on file  Occupational History  . Not on file  Tobacco Use  . Smoking status: Former Smoker    Types: Cigarettes    Quit date: 2004    Years since quitting: 17.0  . Smokeless tobacco: Current User  . Tobacco comment: Quit 2004  Substance and Sexual Activity  . Alcohol use: No  . Drug use: No  . Sexual activity: Not Currently  Other Topics Concern  . Not on file  Social History Narrative  . Not on file   Social Determinants of Health   Financial Resource Strain:   . Difficulty of Paying Living Expenses: Not on file  Food Insecurity:   . Worried About Charity fundraiser in the Last Year: Not on file  . Ran Out of Food in the Last Year:  Not on file  Transportation Needs:   . Lack of Transportation (Medical): Not on file  . Lack of Transportation (Non-Medical): Not on file  Physical Activity:   . Days of Exercise per Week: Not on file  . Minutes of Exercise per Session: Not on file  Stress:   . Feeling of Stress : Not on file  Social Connections:   . Frequency of Communication with Friends and Family: Not on file  . Frequency of Social Gatherings with Friends and Family: Not on file  . Attends Religious Services: Not on file  . Active Member of Clubs or Organizations: Not on file  . Attends Archivist Meetings: Not on file  . Marital Status: Not on file  Intimate Partner Violence:   . Fear of Current or Ex-Partner: Not on file  . Emotionally Abused: Not on file  . Physically Abused: Not on file  . Sexually Abused: Not on file    Family History  Problem Relation Age of Onset  . CAD Mother   .  CAD Father        a. MI at 57-->death  . CAD Brother      Current Outpatient Medications:  .  acetaminophen (TYLENOL) 500 MG tablet, Take 1,000 mg by mouth every 6 (six) hours as needed for moderate pain. , Disp: , Rfl:  .  dexamethasone (DECADRON) 4 MG tablet, Take 1 tablet (4 mg total) by mouth 2 (two) times daily with a meal., Disp: 28 tablet, Rfl: 0 .  empagliflozin (JARDIANCE) 25 MG TABS tablet, Take 25 mg by mouth daily., Disp: , Rfl:  .  gabapentin (NEURONTIN) 300 MG capsule, Take 300 mg by mouth at bedtime. , Disp: , Rfl:  .  glipiZIDE (GLUCOTROL) 10 MG tablet, Take 10 mg by mouth 2 (two) times daily before a meal., Disp: , Rfl:  .  Glycopyrrolate-Formoterol (BEVESPI AEROSPHERE) 9-4.8 MCG/ACT AERO, Inhale 2 puffs into the lungs 2 (two) times daily., Disp: 10.7 g, Rfl: 11 .  HYDROcodone-homatropine (HYCODAN) 5-1.5 MG/5ML syrup, Take 5 mLs by mouth every 6 (six) hours as needed for cough., Disp: 120 mL, Rfl: 0 .  lidocaine-prilocaine (EMLA) cream, Apply to affected area once, Disp: 30 g, Rfl: 3 .  losartan  (COZAAR) 50 MG tablet, Take 50 mg by mouth daily. , Disp: , Rfl:  .  metoprolol tartrate (LOPRESSOR) 25 MG tablet, Take 1 tablet (25 mg total) by mouth 2 (two) times daily., Disp: 180 tablet, Rfl: 0 .  morphine (MSIR) 15 MG tablet, Take 1 tablet (15 mg total) by mouth every 4 (four) hours as needed for severe pain., Disp: 120 tablet, Rfl: 0 .  Omega-3 Fatty Acids (FISH OIL) 1200 MG CAPS, Take 2,400 mg by mouth at bedtime. , Disp: , Rfl:  .  omeprazole (PRILOSEC) 20 MG capsule, Take 1 capsule by mouth as needed., Disp: , Rfl:  .  ondansetron (ZOFRAN) 8 MG tablet, Take 1 tablet (8 mg total) by mouth 2 (two) times daily as needed for refractory nausea / vomiting. Start on day 3 after chemo., Disp: 30 tablet, Rfl: 1 .  prochlorperazine (COMPAZINE) 10 MG tablet, Take 1 tablet (10 mg total) by mouth every 6 (six) hours as needed (Nausea or vomiting)., Disp: 30 tablet, Rfl: 1 .  rivaroxaban (XARELTO) 20 MG TABS tablet, Take 1 tablet (20 mg total) by mouth daily with supper., Disp: 90 tablet, Rfl: 2 .  sucralfate (CARAFATE) 1 g tablet, Take 1 tablet (1 g total) by mouth 3 (three) times daily before meals. Dissolve tablet in warm water, swish and swallow, Disp: 90 tablet, Rfl: 6 .  aspirin EC 81 MG tablet, Take 1 tablet (81 mg total) by mouth daily. (Patient not taking: Reported on 01/01/2020), Disp: 90 tablet, Rfl: 3 .  dexamethasone (DECADRON) 4 MG tablet, Take 2 tablets (8 mg total) by mouth daily. Start the day after chemotherapy for 2 days. (Patient not taking: Reported on 01/01/2020), Disp: 30 tablet, Rfl: 1 .  LORazepam (ATIVAN) 0.5 MG tablet, Take 1 tablet (0.5 mg total) by mouth every 6 (six) hours as needed for anxiety., Disp: 30 tablet, Rfl: 0 .  oxyCODONE (OXY IR/ROXICODONE) 5 MG immediate release tablet, Take 1 tablet (5 mg total) by mouth every 4 (four) hours as needed for severe pain. (Patient not taking: Reported on 01/01/2020), Disp: 180 tablet, Rfl: 0 .  sertraline (ZOLOFT) 25 MG tablet, Take 1  tablet (25 mg total) by mouth daily. (Patient not taking: Reported on 01/01/2020), Disp: 90 tablet, Rfl: 0  Physical exam:  Vitals:  01/01/20 0935  BP: (!) 124/92  Pulse: 75  Resp: 18  Temp: 98.8 F (37.1 C)  TempSrc: Tympanic  SpO2: 100%  Weight: 241 lb (109.3 kg)   Physical Exam HENT:     Head: Normocephalic and atraumatic.     Mouth/Throat:     Mouth: Mucous membranes are moist.     Pharynx: Oropharynx is clear.  Eyes:     Pupils: Pupils are equal, round, and reactive to light.  Cardiovascular:     Rate and Rhythm: Normal rate and regular rhythm.     Heart sounds: Normal heart sounds.  Pulmonary:     Effort: Pulmonary effort is normal.     Breath sounds: Normal breath sounds.  Abdominal:     General: Bowel sounds are normal.     Palpations: Abdomen is soft.  Musculoskeletal:     Cervical back: Normal range of motion.  Skin:    General: Skin is warm and dry.  Neurological:     Mental Status: He is alert and oriented to person, place, and time.      CMP Latest Ref Rng & Units 12/31/2019  Glucose 70 - 99 mg/dL 292(H)  BUN 8 - 23 mg/dL 17  Creatinine 0.61 - 1.24 mg/dL 0.73  Sodium 135 - 145 mmol/L 133(L)  Potassium 3.5 - 5.1 mmol/L 4.7  Chloride 98 - 111 mmol/L 100  CO2 22 - 32 mmol/L 24  Calcium 8.9 - 10.3 mg/dL 9.0  Total Protein 6.5 - 8.1 g/dL 7.1  Total Bilirubin 0.3 - 1.2 mg/dL 0.6  Alkaline Phos 38 - 126 U/L 94  AST 15 - 41 U/L 16  ALT 0 - 44 U/L 22   CBC Latest Ref Rng & Units 12/31/2019  WBC 4.0 - 10.5 K/uL 6.7  Hemoglobin 13.0 - 17.0 g/dL 12.5(L)  Hematocrit 39.0 - 52.0 % 36.7(L)  Platelets 150 - 400 K/uL 185     Assessment and plan- Patient is a 69 y.o. 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  He is here for on treatment assessment prior to cycle 7 of weekly carboTaxol chemotherapy  Counts okay to proceed with cycle 7 of weekly carbotaxol chemotherapy today.  He did have some chemo-induced neutropenia last week for which he  received Neupogen.  White cell count has normalized today.  This will be his last weekly cycle of chemotherapy  Patient will proceed for CT chest with this tentatively in 2 weeks time.  Patient also has a baseline right renal mass which will be noted by an MRI abdomen with and without contrast he will follow up with urology after that.    I will see him back in 3 to 4 weeks time just to discuss the findings and proceed with maintenance durvalumab if he is found to have stable disease or partial response  Radiation esophagitis: Given morphine for pain   Visit Diagnosis 1. Malignant neoplasm of upper lobe of left lung ([place])   2. Encounter for antineoplastic chemotherapy   3. Radiation esophagitis      [name], MD, MPH [facility] [phone] 01/02/2020 7:27 PM

## 2020-01-05 ENCOUNTER — Ambulatory Visit
Admission: RE | Admit: 2020-01-05 | Discharge: 2020-01-05 | Disposition: A | Discharge status: Home / Self Care | Payer: Medicare Other | Source: Ambulatory Visit | Attending: Radiation Oncology | Admitting: Radiation Oncology

## 2020-01-05 ENCOUNTER — Other Ambulatory Visit: Payer: Self-pay

## 2020-01-05 DIAGNOSIS — Z510 Encounter for antineoplastic radiation therapy: Secondary | ICD-10-CM | POA: Diagnosis not present

## 2020-01-06 ENCOUNTER — Other Ambulatory Visit: Payer: Self-pay

## 2020-01-06 ENCOUNTER — Telehealth: Payer: Self-pay

## 2020-01-06 ENCOUNTER — Ambulatory Visit
Admission: RE | Admit: 2020-01-06 | Discharge: 2020-01-06 | Disposition: A | Discharge status: Home / Self Care | Payer: Medicare Other | Source: Ambulatory Visit | Attending: Radiation Oncology | Admitting: Radiation Oncology

## 2020-01-06 DIAGNOSIS — Z510 Encounter for antineoplastic radiation therapy: Secondary | ICD-10-CM | POA: Diagnosis not present

## 2020-01-06 NOTE — Telephone Encounter (Signed)
Called patient to let him know that [facility] had called [facility] to let him know that his Xarelto 90 days supply would cost him $12.00 and Sertraline 90 days supply would cost him $1.05 if it was cash. Patient stated that he loved the price and that he would call the pharmacy to let them know that he would want them filled and that he would go and pick them up.

## 2020-01-07 ENCOUNTER — Ambulatory Visit
Admission: RE | Admit: 2020-01-07 | Discharge: 2020-01-07 | Disposition: A | Discharge status: Home / Self Care | Payer: Medicare Other | Source: Ambulatory Visit | Attending: Radiation Oncology | Admitting: Radiation Oncology

## 2020-01-07 ENCOUNTER — Other Ambulatory Visit: Payer: Self-pay

## 2020-01-07 DIAGNOSIS — Z510 Encounter for antineoplastic radiation therapy: Secondary | ICD-10-CM | POA: Diagnosis not present

## 2020-01-08 ENCOUNTER — Telehealth: Payer: Self-pay | Admitting: *Deleted

## 2020-01-08 ENCOUNTER — Ambulatory Visit
Admission: RE | Admit: 2020-01-08 | Discharge: 2020-01-08 | Disposition: A | Discharge status: Home / Self Care | Payer: Medicare Other | Source: Ambulatory Visit | Attending: Radiation Oncology | Admitting: Radiation Oncology

## 2020-01-08 ENCOUNTER — Other Ambulatory Visit: Payer: Self-pay

## 2020-01-08 DIAGNOSIS — Z510 Encounter for antineoplastic radiation therapy: Secondary | ICD-10-CM | POA: Diagnosis not present

## 2020-01-08 MED ORDER — EZETIMIBE 10 MG PO TABS: 10.0000 mg | ORAL_TABLET | Freq: Every day | ORAL | 3 refills | Status: DC

## 2020-01-08 NOTE — Telephone Encounter (Signed)
Please advise if ok to refill pt's zetia 10 mg qd.  Medication wasn't on pt's current med list. Pt verified he is still taking at this time only that he has ran out and needs new Rx if he needs to continue.

## 2020-01-08 NOTE — Telephone Encounter (Signed)
Rx refill for Zetia sent to the pharmacy.

## 2020-01-08 NOTE — Telephone Encounter (Signed)
Ok to refill ezetimibe and f/u at the patient's convenience.  [name], MD [facility]

## 2020-01-08 NOTE — Telephone Encounter (Signed)
The patient had a telemedicine visit on 03/31/19 with Dr. Saunders Revel. He was due to come back in August for a follow up appointment. There is a phone note in his chart from 11/18/19 stating the patient could not schedule a follow up appointment at that time due to chemo/ radiation. His last lipid panel on file is from 11/2018.  Will forward to Dr. Saunders Revel to refill Zetia refill. He was taking this at his last appointment.

## 2020-01-12 ENCOUNTER — Other Ambulatory Visit: Payer: Self-pay

## 2020-01-12 ENCOUNTER — Ambulatory Visit
Admission: RE | Admit: 2020-01-12 | Discharge: 2020-01-12 | Disposition: A | Discharge status: Home / Self Care | Payer: Medicare Other | Source: Ambulatory Visit | Attending: Oncology | Admitting: Oncology

## 2020-01-12 DIAGNOSIS — N2889 Other specified disorders of kidney and ureter: Secondary | ICD-10-CM | POA: Diagnosis present

## 2020-01-12 MED ORDER — GADOBUTROL 1 MMOL/ML IV SOLN
10.0000 mL | Freq: Once | INTRAVENOUS | Status: AC | PRN
  Administered 2020-01-12: 10 mL via INTRAVENOUS

## 2020-01-13 NOTE — Telephone Encounter (Signed)
Oral Oncology Patient Advocate Encounter  Dispensed samples to patient on 01/02/2020:  Medication: Xarelto 20mg  Instructions: Take 1 tablet by mouth daily with supper. Quantity dispensed: 21 Days supply: 21 Manufacturer: [employer] Lot: [id number] Exp: 05/2020 [facility]ph Verified: [place] [facility] [place] Phone [phone] Fax [phone] 01/13/2020 9:30 AM

## 2020-01-15 ENCOUNTER — Ambulatory Visit: Payer: Medicare Other | Admitting: Pulmonary Disease

## 2020-01-16 ENCOUNTER — Other Ambulatory Visit: Payer: Self-pay | Admitting: Oncology

## 2020-01-16 DIAGNOSIS — C3412 Malignant neoplasm of upper lobe, left bronchus or lung: Secondary | ICD-10-CM

## 2020-01-16 MED ORDER — LORAZEPAM 0.5 MG PO TABS: 0.5000 mg | ORAL_TABLET | Freq: Four times a day (QID) | ORAL | 0 refills | Status: DC | PRN

## 2020-01-16 NOTE — Telephone Encounter (Signed)
Patient called cancer center requesting refill of Ativan 0.5 mg q 6 hours prn for anxiety.  As mandated by the [place] STOP Act (Strengthen Opioid Misuse Prevention), the [place] Controlled Substance Reporting System ([place]) was reviewed for this patient. Below is the past 72-months of controlled substance prescriptions as displayed by the registry. I have personally consulted with my supervising physician, Dr. Janese Banks, who agrees that continuation of opiate/benzodiazepine therapy is medically appropriate at this time and agrees to provide continual monitoring, including urine/blood drug screens, as indicated. Refill is appropriate on or after 01/09/2020.   NCCSRS reviewed: Reviewed and ok for refill    Faythe Casa, NP 01/16/2020 1:34 PM [phone]

## 2020-01-21 ENCOUNTER — Other Ambulatory Visit: Payer: Self-pay

## 2020-01-21 ENCOUNTER — Ambulatory Visit
Admission: RE | Admit: 2020-01-21 | Discharge: 2020-01-21 | Disposition: A | Discharge status: Home / Self Care | Payer: Medicare Other | Source: Ambulatory Visit | Attending: Oncology | Admitting: Oncology

## 2020-01-21 DIAGNOSIS — C3412 Malignant neoplasm of upper lobe, left bronchus or lung: Secondary | ICD-10-CM | POA: Insufficient documentation

## 2020-01-21 MED ORDER — IOHEXOL 300 MG/ML  SOLN
75.0000 mL | Freq: Once | INTRAMUSCULAR | Status: AC | PRN
  Administered 2020-01-21: 09:00:00 75 mL via INTRAVENOUS

## 2020-01-23 NOTE — Telephone Encounter (Signed)
Patient declined to schedule but would like a call back next week.

## 2020-01-24 ENCOUNTER — Encounter: Payer: Self-pay | Admitting: Urology

## 2020-01-26 ENCOUNTER — Encounter: Payer: Self-pay | Admitting: Urology

## 2020-01-26 ENCOUNTER — Other Ambulatory Visit: Payer: Self-pay | Admitting: Oncology

## 2020-01-26 NOTE — Progress Notes (Signed)
DISCONTINUE ON PATHWAY REGIMEN - Non-Small Cell Lung     Administer weekly:     Paclitaxel      Carboplatin   **Always confirm dose/schedule in your pharmacy ordering system**  REASON: Continuation Of Treatment PRIOR TREATMENT: EBR830: Carboplatin AUC=2 + Paclitaxel 45 mg/m2 Weekly During Radiation TREATMENT RESPONSE: Partial Response (PR)  START ON PATHWAY REGIMEN - Non-Small Cell Lung     A cycle is every 14 days:     Durvalumab   **Always confirm dose/schedule in your pharmacy ordering system**  Patient Characteristics: Stage III - Unresectable, PS = 0, 1 AJCC T Category: T3 Current Disease Status: No Distant Mets or Local Recurrence AJCC N Category: N3 AJCC M Category: M0 AJCC 8 Stage Grouping: IIIC ECOG Performance Status: 1 Intent of Therapy: Curative Intent, Discussed with Patient

## 2020-01-28 ENCOUNTER — Telehealth (INDEPENDENT_AMBULATORY_CARE_PROVIDER_SITE_OTHER): Payer: Medicare Other | Admitting: Urology

## 2020-01-28 ENCOUNTER — Encounter: Payer: Self-pay | Admitting: Radiation Oncology

## 2020-01-28 ENCOUNTER — Other Ambulatory Visit: Payer: Self-pay

## 2020-01-28 ENCOUNTER — Other Ambulatory Visit: Payer: Self-pay | Admitting: *Deleted

## 2020-01-28 DIAGNOSIS — N2889 Other specified disorders of kidney and ureter: Secondary | ICD-10-CM | POA: Diagnosis not present

## 2020-01-28 MED ORDER — SUCRALFATE 1 G PO TABS: 1.0000 g | ORAL_TABLET | Freq: Three times a day (TID) | ORAL | 6 refills | Status: DC

## 2020-01-28 NOTE — Progress Notes (Signed)
Virtual Visit via Video Note  I connected with Derrick Gallegos on 01/28/20 at  4:15 PM EST by a video enabled telemedicine application and verified that I am speaking with the correct person using two identifiers.  Location: Patient: home Provider: office   I discussed the limitations of evaluation and management by telemedicine and the availability of in person appointments. The patient expressed understanding and agreed to proceed.  History of Present Illness: 69 year old male with a personal history of stage III lung cancer currently undergoing treatment as well as an incidental right renal mass who presents today with follow-up MRI.  Since last visit, he has undergone definitive imaging in the form of MRI of the abdomen with and without contrast.  This indicates a 3.2 cm enhancing left lateral mid renal mass consistent with renal cell carcinoma.  He is continue to recover from chemo rads.  He feels like his energy level is low.  No flank pain or gross hematuria.  Please see previous notes for details    Observations/Objective: Well-dressed, slightly pale in appearance, interactive  Assessment and Plan:  1.  Right renal mass 3.2 cm enhancing right renal mass with features consistent with renal cell carcinoma based on MRI findings.  No previous imaging to assess growth rate.  A solid renal mass raises the suspicion of primary renal malignancy.  We discussed this in detail and in regards to the spectrum of renal masses which includes cysts (pure cysts are considered benign), solid masses and everything in between. The risk of metastasis increases as the size of solid renal mass increases. In general, it is believed that the risk of metastasis for renal masses less than 3-4 cm is small (up to approximately 5%) based mainly on large retrospective studies. In some cases and especially in patients of older age and multiple comorbidities a surveillance approach may be appropriate. The  treatment of solid renal masses includes: surveillance, cryoablation (percutaneous and laparoscopic) in addition to partial and complete nephrectomy (each with option of laparoscopic, robotic and open depending on appropriateness). Furthermore, nephrectomy appears to be an independent risk factor for the development of chronic kidney disease suggesting that nephron sparing approaches should be implored whenever feasible. We reviewed these options in context of the patients current situation as well as the pros and cons of each.  At this time given his treatments for lung cancer as well as overall physical and mental wellbeing, he would like to defer treatment.  This seems reasonable.  We can plan to repeat the MRI in 3 months and reassess.  This will also help to assess the growth rate.  If he does elect intervention at that time, the mass does appear amenable to cryoablation and/or surgical intervention.  He likely believes that he would want to pursue a lesser invasive approach.  We will discuss this further at his follow-up.  Plan for MRI in 6 months. - MR Abdomen W Wo Contrast; Future   Follow Up Instructions: 6 months with MRI prior   I discussed the assessment and treatment plan with the patient. The patient was provided an opportunity to ask questions and all were answered. The patient agreed with the plan and demonstrated an understanding of the instructions.   The patient was advised to call back or seek an in-person evaluation if the symptoms worsen or if the condition fails to improve as anticipated.  I provided 8 minutes of non-face-to-face time during this encounter.   Hollice Espy, MD

## 2020-01-30 ENCOUNTER — Encounter: Payer: Self-pay | Admitting: Oncology

## 2020-01-30 ENCOUNTER — Inpatient Hospital Stay: Payer: Medicare Other | Attending: Oncology

## 2020-01-30 ENCOUNTER — Other Ambulatory Visit: Payer: Self-pay

## 2020-01-30 ENCOUNTER — Inpatient Hospital Stay: Payer: Medicare Other

## 2020-01-30 ENCOUNTER — Inpatient Hospital Stay (HOSPITAL_BASED_OUTPATIENT_CLINIC_OR_DEPARTMENT_OTHER): Payer: Medicare Other | Admitting: Oncology

## 2020-01-30 ENCOUNTER — Other Ambulatory Visit: Payer: Self-pay | Admitting: *Deleted

## 2020-01-30 VITALS — BP 140/87 | HR 65

## 2020-01-30 VITALS — BP 101/65 | HR 78 | Temp 97.1°F | Resp 16 | Wt 236.8 lb

## 2020-01-30 DIAGNOSIS — Z7189 Other specified counseling: Secondary | ICD-10-CM | POA: Diagnosis not present

## 2020-01-30 DIAGNOSIS — C3412 Malignant neoplasm of upper lobe, left bronchus or lung: Secondary | ICD-10-CM | POA: Insufficient documentation

## 2020-01-30 DIAGNOSIS — N2889 Other specified disorders of kidney and ureter: Secondary | ICD-10-CM | POA: Diagnosis not present

## 2020-01-30 DIAGNOSIS — E876 Hypokalemia: Secondary | ICD-10-CM

## 2020-01-30 DIAGNOSIS — Z7901 Long term (current) use of anticoagulants: Secondary | ICD-10-CM | POA: Diagnosis not present

## 2020-01-30 DIAGNOSIS — F419 Anxiety disorder, unspecified: Secondary | ICD-10-CM | POA: Insufficient documentation

## 2020-01-30 DIAGNOSIS — I10 Essential (primary) hypertension: Secondary | ICD-10-CM | POA: Diagnosis not present

## 2020-01-30 DIAGNOSIS — E119 Type 2 diabetes mellitus without complications: Secondary | ICD-10-CM | POA: Insufficient documentation

## 2020-01-30 DIAGNOSIS — Z923 Personal history of irradiation: Secondary | ICD-10-CM | POA: Insufficient documentation

## 2020-01-30 DIAGNOSIS — Z5112 Encounter for antineoplastic immunotherapy: Secondary | ICD-10-CM | POA: Insufficient documentation

## 2020-01-30 DIAGNOSIS — K208 Other esophagitis without bleeding: Secondary | ICD-10-CM

## 2020-01-30 DIAGNOSIS — E785 Hyperlipidemia, unspecified: Secondary | ICD-10-CM | POA: Diagnosis not present

## 2020-01-30 DIAGNOSIS — Z79899 Other long term (current) drug therapy: Secondary | ICD-10-CM | POA: Diagnosis not present

## 2020-01-30 DIAGNOSIS — K219 Gastro-esophageal reflux disease without esophagitis: Secondary | ICD-10-CM | POA: Insufficient documentation

## 2020-01-30 DIAGNOSIS — Z9221 Personal history of antineoplastic chemotherapy: Secondary | ICD-10-CM | POA: Diagnosis not present

## 2020-01-30 DIAGNOSIS — I252 Old myocardial infarction: Secondary | ICD-10-CM | POA: Diagnosis not present

## 2020-01-30 DIAGNOSIS — Z7984 Long term (current) use of oral hypoglycemic drugs: Secondary | ICD-10-CM | POA: Insufficient documentation

## 2020-01-30 DIAGNOSIS — Z87891 Personal history of nicotine dependence: Secondary | ICD-10-CM | POA: Insufficient documentation

## 2020-01-30 DIAGNOSIS — Z7952 Long term (current) use of systemic steroids: Secondary | ICD-10-CM | POA: Insufficient documentation

## 2020-01-30 DIAGNOSIS — Y842 Radiological procedure and radiotherapy as the cause of abnormal reaction of the patient, or of later complication, without mention of misadventure at the time of the procedure: Secondary | ICD-10-CM | POA: Insufficient documentation

## 2020-01-30 DIAGNOSIS — R5381 Other malaise: Secondary | ICD-10-CM | POA: Diagnosis not present

## 2020-01-30 DIAGNOSIS — I2510 Atherosclerotic heart disease of native coronary artery without angina pectoris: Secondary | ICD-10-CM | POA: Diagnosis not present

## 2020-01-30 DIAGNOSIS — R5383 Other fatigue: Secondary | ICD-10-CM | POA: Diagnosis not present

## 2020-01-30 DIAGNOSIS — Z95828 Presence of other vascular implants and grafts: Secondary | ICD-10-CM

## 2020-01-30 DIAGNOSIS — Z7982 Long term (current) use of aspirin: Secondary | ICD-10-CM | POA: Diagnosis not present

## 2020-01-30 DIAGNOSIS — M1990 Unspecified osteoarthritis, unspecified site: Secondary | ICD-10-CM | POA: Diagnosis not present

## 2020-01-30 DIAGNOSIS — T66XXXA Radiation sickness, unspecified, initial encounter: Secondary | ICD-10-CM

## 2020-01-30 LAB — CBC WITH DIFFERENTIAL/PLATELET
Abs Immature Granulocytes: 0.06 10*3/uL (ref 0.00–0.07)
Basophils Absolute: 0 10*3/uL (ref 0.0–0.1)
Basophils Relative: 0 %
Eosinophils Absolute: 0.1 10*3/uL (ref 0.0–0.5)
Eosinophils Relative: 1 %
HCT: 34.1 % — ABNORMAL LOW (ref 39.0–52.0)
Hemoglobin: 11.5 g/dL — ABNORMAL LOW (ref 13.0–17.0)
Immature Granulocytes: 1 %
Lymphocytes Relative: 18 %
Lymphs Abs: 0.9 10*3/uL (ref 0.7–4.0)
MCH: 32.3 pg (ref 26.0–34.0)
MCHC: 33.7 g/dL (ref 30.0–36.0)
MCV: 95.8 fL (ref 80.0–100.0)
Monocytes Absolute: 0.8 10*3/uL (ref 0.1–1.0)
Monocytes Relative: 16 %
Neutro Abs: 3.2 10*3/uL (ref 1.7–7.7)
Neutrophils Relative %: 64 %
Platelets: 211 10*3/uL (ref 150–400)
RBC: 3.56 MIL/uL — ABNORMAL LOW (ref 4.22–5.81)
RDW: 17.8 % — ABNORMAL HIGH (ref 11.5–15.5)
WBC: 5 10*3/uL (ref 4.0–10.5)
nRBC: 0 % (ref 0.0–0.2)

## 2020-01-30 LAB — COMPREHENSIVE METABOLIC PANEL
ALT: 14 U/L (ref 0–44)
AST: 15 U/L (ref 15–41)
Albumin: 3.7 g/dL (ref 3.5–5.0)
Alkaline Phosphatase: 77 U/L (ref 38–126)
Anion gap: 8 (ref 5–15)
BUN: 12 mg/dL (ref 8–23)
CO2: 24 mmol/L (ref 22–32)
Calcium: 8.8 mg/dL — ABNORMAL LOW (ref 8.9–10.3)
Chloride: 100 mmol/L (ref 98–111)
Creatinine, Ser: 0.57 mg/dL — ABNORMAL LOW (ref 0.61–1.24)
GFR calc Af Amer: >60 mL/min (ref >60)
GFR calc non Af Amer: >60 mL/min (ref >60)
Glucose, Bld: 122 mg/dL — ABNORMAL HIGH (ref 70–99)
Potassium: 3.3 mmol/L — ABNORMAL LOW (ref 3.5–5.1)
Sodium: 132 mmol/L — ABNORMAL LOW (ref 135–145)
Total Bilirubin: 0.4 mg/dL (ref 0.3–1.2)
Total Protein: 6.8 g/dL (ref 6.5–8.1)

## 2020-01-30 LAB — TSH: TSH: 0.254 u[IU]/mL — ABNORMAL LOW (ref 0.350–4.500)

## 2020-01-30 MED ORDER — SODIUM CHLORIDE 0.9% FLUSH
10.0000 mL | Freq: Once | INTRAVENOUS | Status: AC
  Administered 2020-01-30: 13:00:00 10 mL via INTRAVENOUS
  Filled 2020-01-30: qty 10

## 2020-01-30 MED ORDER — HEPARIN SOD (PORK) LOCK FLUSH 100 UNIT/ML IV SOLN
INTRAVENOUS | Status: AC
  Filled 2020-01-30: qty 5

## 2020-01-30 MED ORDER — MORPHINE SULFATE (CONCENTRATE) 20 MG/ML PO SOLN: 20.0000 mg | ORAL | 0 refills | Status: DC | PRN

## 2020-01-30 MED ORDER — SODIUM CHLORIDE 0.9 % IV SOLN
10.0000 mg/kg | Freq: Once | INTRAVENOUS | Status: AC
  Administered 2020-01-30: 15:00:00 1120 mg via INTRAVENOUS
  Filled 2020-01-30: qty 20

## 2020-01-30 MED ORDER — SODIUM CHLORIDE 0.9 % IV SOLN
Freq: Once | INTRAVENOUS | Status: AC
  Filled 2020-01-30: qty 1000

## 2020-01-30 MED ORDER — HEPARIN SOD (PORK) LOCK FLUSH 100 UNIT/ML IV SOLN
500.0000 [IU] | Freq: Once | INTRAVENOUS | Status: AC | PRN
  Administered 2020-01-30: 17:00:00 500 [IU]
  Filled 2020-01-30: qty 5

## 2020-01-30 MED ORDER — SODIUM CHLORIDE 0.9 % IV SOLN
Freq: Once | INTRAVENOUS | Status: AC
  Filled 2020-01-30: qty 250

## 2020-01-30 NOTE — Progress Notes (Signed)
Pt states still having pain with eating. He cna tolerate grits and soft eggs and water. He has to eat small bites and chase it water. Still using carafate

## 2020-02-03 ENCOUNTER — Encounter: Payer: Self-pay | Admitting: Oncology

## 2020-02-03 NOTE — Progress Notes (Signed)
Hematology/Oncology Consult note [facility]  [phone] [phone]  Patient Care Team: Center, [facility] as PCP - General ([facility]) Telford Nab, RN as Registered Nurse   Name of the patient: Derrick Gallegos  [id number]  04/08/1951   Date of visit: 02/03/20  Diagnosis- Stage IIIC SCC lung [id number]   Chief complaint/ Reason for visit-on treatment assessment prior to cycle 1 of maintenance durvalumab  Heme/Onc history: patient is a 69 year old malewho was a former smoker and smoked about 2 to 3 packs/day for many years and quit smoking a few years ago. He presented to the ER with symptoms of cough and shortness of breathWhich prompted [place] chest x-ray followed by a CT scan. CT scan showed a 3.4 x 3.2 cm left perihilar mass along the left paratracheal precarinal and subcarinal vascular and left hilar adenopathy. Patient has been referred for further management.  PET CT scan showed a large left upper lobe lung mass measuring 6.3 x 3.4 cm. Bilateral mediastinal adenopathy as well as left supraclavicular adenopathy. 3.4 cm partially exophytic lesion of the right kidney. No evidence of distant metastatic disease.  Cycle 1 of weekly carbotaxol chemotherapy with radiation started on 11/17/2019   Patient has met with Dr. Erlene Quan for right renal mass which is being monitored for now and consideration for cryoablation in the future  Interval history-patient still reports burning and pain during swallowing but it is overall getting better.  His mood is more upbeat.  Energy levels are improving  ECOG PS- 1 Pain scale- 0  Review of systems- Review of Systems  Constitutional: Positive for malaise/fatigue. Negative for chills, fever and weight loss.  HENT: Negative for congestion, ear discharge and nosebleeds.   Eyes: Negative for blurred vision.  Respiratory: Negative for cough, hemoptysis, sputum production,  shortness of breath and wheezing.   Cardiovascular: Negative for chest pain, palpitations, orthopnea and claudication.  Gastrointestinal: Negative for abdominal pain, blood in stool, constipation, diarrhea, heartburn, melena, nausea and vomiting.       Difficulty swallowing  Genitourinary: Negative for dysuria, flank pain, frequency, hematuria and urgency.  Musculoskeletal: Negative for back pain, joint pain and myalgias.  Skin: Negative for rash.  Neurological: Negative for dizziness, tingling, focal weakness, seizures, weakness and headaches.  Endo/Heme/Allergies: Does not bruise/bleed easily.  Psychiatric/Behavioral: Negative for depression and suicidal ideas. The patient does not have insomnia.       No Known Allergies   Past Medical History:  Diagnosis Date   Anxiety    Arthritis    Atrial flutter ([place])    a. diagnosed 5/19; b. s/p TEE/DCCV 05/2018 by Dr. Humphrey Rolls; c. CHADS2VASc => 4 (HTN, age x1, DM, vascular disease)   Complication of anesthesia    "wakes up too early"   Coronary artery disease    a. s/p 3-v cabg in 2004 at [place] (LIMA and radial arery used - no further details in Care Everywhere); b. LHC 8/19 underlying multi-V dz, patent LIMA-LAD, occluded VG-PDA, 90-95% stenosis of LCx s/p PCI/DES x 2   Cough    Depression    Diabetes mellitus with complication (HCC)    Dyspnea    Dysrhythmia    atrial fib.   Essential hypertension    Family history of premature CAD    a. father passed away from an MI at 27   GERD (gastroesophageal reflux disease)    Heart murmur    HLD (hyperlipidemia)    a. statin intolerant  Lung cancer ([place]) 09/2019   Chemo + Rad tx's.    Mass of left lung    MI (myocardial infarction) [facility])    2004   Obesity      Past Surgical History:  Procedure Laterality Date   CARDIAC CATHETERIZATION  2004   CARDIAC CATHETERIZATION  2019   x2 stents [place] N/A 06/20/2018   Procedure:  CARDIOVERSION;  Surgeon: Dionisio David, MD;  Location: [facility] ORS;  Service: Cardiovascular;  Laterality: N/A;   CORONARY ARTERY BYPASS GRAFT     CORONARY STENT INTERVENTION N/A 08/08/2018   Procedure: CORONARY STENT INTERVENTION;  Surgeon: Isaias Cowman, MD;  Location: [place] [facility];  Service: Cardiovascular;  Laterality: N/A;   LEFT HEART CATH AND CORONARY ANGIOGRAPHY Left 08/08/2018   Procedure: LEFT HEART CATH AND CORONARY ANGIOGRAPHY;  Surgeon: Dionisio David, MD;  Location: [place] [facility];  Service: Cardiovascular;  Laterality: Left;   PORTA CATH INSERTION N/A 11/10/2019   Procedure: PORTA CATH INSERTION;  Surgeon: Algernon Huxley, MD;  Location: [place] [facility];  Service: Cardiovascular;  Laterality: N/A;   TEE WITHOUT CARDIOVERSION N/A 06/20/2018   Procedure: TRANSESOPHAGEAL ECHOCARDIOGRAM (TEE);  Surgeon: Dionisio David, MD;  Location: [facility] OR[facility];  Service: Cardiovascular;  Laterality: N/A;   VIDEO BRONCHOSCOPY N/A 10/29/2019   Procedure: VIDEO BRONCHOSCOPY WITH ENDOBRONCHICAL ULTRASOUND;  Surgeon: Tyler Pita, MD;  Location: [facility] OR[facility];  Service: Cardiopulmonary;  Laterality: N/A;    Social History   Socioeconomic History   Marital status: Married    Spouse name: Not on file   Number of children: Not on file   Years of education: Not on file   Highest education level: Not on file  Occupational History   Not on file  Tobacco Use   Smoking status: Former Smoker    Types: Cigarettes    Quit date: 2004    Years since quitting: 17.1   Smokeless tobacco: Current User   Tobacco comment: Quit 2004  Substance and Sexual Activity   Alcohol use: No   Drug use: No   Sexual activity: Not Currently  Other Topics Concern   Not on file  Social History Narrative   Not on file   Social Determinants of Health   Financial Resource Strain:    Difficulty of Paying Living Expenses: Not on file  Food Insecurity:    Worried About Ship broker in the Last Year: Not on file   [employer] of Food in the Last Year: Not on file  Transportation Needs:    Lack of Transportation (Medical): Not on file   Lack of Transportation (Non-Medical): Not on file  Physical Activity:    Days of Exercise per Week: Not on file   Minutes of Exercise per Session: Not on file  Stress:    Feeling of Stress : Not on file  Social Connections:    Frequency of Communication with Friends and Family: Not on file   Frequency of Social Gatherings with Friends and Family: Not on file   Attends Religious Services: Not on file   Active Member of Clubs or Organizations: Not on file   Attends Archivist Meetings: Not on file   Marital Status: Not on file  Intimate Partner Violence:    Fear of Current or Ex-Partner: Not on file   Emotionally Abused: Not on file   Physically Abused: Not on file   Sexually Abused:  Not on file    Family History  Problem Relation Age of Onset   CAD Mother    CAD Father        a. MI at 57-->death   CAD Brother      Current Outpatient Medications:    acetaminophen (TYLENOL) 500 MG tablet, Take 1,000 mg by mouth every 6 (six) hours as needed for moderate pain. , Disp: , Rfl:    empagliflozin (JARDIANCE) 25 MG TABS tablet, Take 25 mg by mouth daily., Disp: , Rfl:    ezetimibe (ZETIA) 10 MG tablet, Take 1 tablet (10 mg total) by mouth daily., Disp: 30 tablet, Rfl: 3   gabapentin (NEURONTIN) 300 MG capsule, Take 300 mg by mouth at bedtime. , Disp: , Rfl:    glipiZIDE (GLUCOTROL) 10 MG tablet, Take 10 mg by mouth 2 (two) times daily before a meal., Disp: , Rfl:    Glycopyrrolate-Formoterol (BEVESPI AEROSPHERE) 9-4.8 MCG/ACT AERO, Inhale 2 puffs into the lungs 2 (two) times daily., Disp: 10.7 g, Rfl: 11   HYDROcodone-homatropine (HYCODAN) 5-1.5 MG/5ML syrup, Take 5 mLs by mouth every 6 (six) hours as needed for cough., Disp: 120 mL, Rfl: 0   LORazepam (ATIVAN) 0.5 MG tablet, Take 1 tablet  (0.5 mg total) by mouth every 6 (six) hours as needed for anxiety., Disp: 30 tablet, Rfl: 0   losartan (COZAAR) 50 MG tablet, Take 50 mg by mouth daily. , Disp: , Rfl:    metoprolol tartrate (LOPRESSOR) 25 MG tablet, Take 1 tablet (25 mg total) by mouth 2 (two) times daily., Disp: 180 tablet, Rfl: 0   Omega-3 Fatty Acids (FISH OIL) 1200 MG CAPS, Take 2,400 mg by mouth at bedtime. , Disp: , Rfl:    omeprazole (PRILOSEC) 20 MG capsule, Take 1 capsule by mouth as needed., Disp: , Rfl:    rivaroxaban (XARELTO) 20 MG TABS tablet, Take 1 tablet (20 mg total) by mouth daily with supper., Disp: 90 tablet, Rfl: 2   sertraline (ZOLOFT) 25 MG tablet, Take 1 tablet (25 mg total) by mouth daily., Disp: 90 tablet, Rfl: 0   sucralfate (CARAFATE) 1 g tablet, Take 1 tablet (1 g total) by mouth 4 (four) times daily -  with meals and at bedtime., Disp: 120 tablet, Rfl: 6   aspirin EC 81 MG tablet, Take 1 tablet (81 mg total) by mouth daily. (Patient not taking: Reported on 01/30/2020), Disp: 90 tablet, Rfl: 3   dexamethasone (DECADRON) 4 MG tablet, Take 1 tablet (4 mg total) by mouth 2 (two) times daily with a meal. (Patient not taking: Reported on 01/30/2020), Disp: 28 tablet, Rfl: 0   morphine (ROXANOL) 20 MG/ML concentrated solution, Take 1 mL (20 mg total) by mouth every 4 (four) hours as needed for severe pain., Disp: 60 mL, Rfl: 0  Physical exam:  Vitals:   01/30/20 1316  BP: 101/65  Pulse: 78  Resp: 16  Temp: (!) 97.1 F (36.2 C)  TempSrc: Tympanic  Weight: 236 lb 12.8 oz (107.4 kg)   Physical Exam Constitutional:      General: He is not in acute distress. HENT:     Head: Normocephalic and atraumatic.     Mouth/Throat:     Mouth: Mucous membranes are moist.     Pharynx: Oropharynx is clear.  Eyes:     Pupils: Pupils are equal, round, and reactive to light.  Cardiovascular:     Rate and Rhythm: Normal rate and regular rhythm.     Heart sounds: Normal  heart sounds.  Pulmonary:      Effort: Pulmonary effort is normal.     Breath sounds: Normal breath sounds.  Abdominal:     General: Bowel sounds are normal.     Palpations: Abdomen is soft.  Musculoskeletal:     Cervical back: Normal range of motion.  Skin:    General: Skin is warm and dry.  Neurological:     Mental Status: He is alert and oriented to person, place, and time.      CMP Latest Ref Rng & Units 01/30/2020  Glucose 70 - 99 mg/dL 122(H)  BUN 8 - 23 mg/dL 12  Creatinine 0.61 - 1.24 mg/dL 0.57(L)  Sodium 135 - 145 mmol/L 132(L)  Potassium 3.5 - 5.1 mmol/L 3.3(L)  Chloride 98 - 111 mmol/L 100  CO2 22 - 32 mmol/L 24  Calcium 8.9 - 10.3 mg/dL 8.8(L)  Total Protein 6.5 - 8.1 g/dL 6.8  Total Bilirubin 0.3 - 1.2 mg/dL 0.4  Alkaline Phos 38 - 126 U/L 77  AST 15 - 41 U/L 15  ALT 0 - 44 U/L 14   CBC Latest Ref Rng & Units 01/30/2020  WBC 4.0 - 10.5 K/uL 5.0  Hemoglobin 13.0 - 17.0 g/dL 11.5(L)  Hematocrit 39.0 - 52.0 % 34.1(L)  Platelets 150 - 400 K/uL 211    No images are attached to the encounter.  CT Chest W Contrast  Result Date: 01/21/2020 CLINICAL DATA:  69 year old male with history of left upper lobe lung cancer. EXAM: CT CHEST WITH CONTRAST TECHNIQUE: Multidetector CT imaging of the chest was performed during intravenous contrast administration. CONTRAST:  34m OMNIPAQUE IOHEXOL 300 MG/ML  SOLN COMPARISON:  Chest CT 10/09/2019.  PET-CT 10/14/2019. FINDINGS: Cardiovascular: Heart size is normal. There is no significant pericardial fluid, thickening or pericardial calcification. There is aortic atherosclerosis, as well as atherosclerosis of the great vessels of the mediastinum and the coronary arteries, including calcified atherosclerotic plaque in the left main, left anterior descending, left circumflex and right coronary arteries. Mediastinum/Nodes: Multiple enlarged mediastinal and left hilar lymph nodes are again noted, but decreased in size compared to the prior examination. The largest residual  left hilar lymph node currently measures 1.4 cm in short axis. The largest mediastinal lymph node is a right paratracheal lymph node measuring 2.2 cm in short axis (previously 3.1 cm). Esophagus is unremarkable in appearance. No axillary lymphadenopathy. Lungs/Pleura: Regression of previously noted left upper lobe mass which is currently in ill-defined area of soft tissue prominence and architectural distortion in the suprahilar aspect of the left upper lobe (axial image 51 of series 3) measuring approximately 2.4 x 2.5 cm. Postobstructive changes in the left upper lobe with areas of atelectasis and patchy areas of ground-glass attenuation in the left upper lobe distal to the lesion. No new suspicious appearing pulmonary nodules or masses are noted. No pleural effusions. Upper Abdomen: Aortic atherosclerosis. Musculoskeletal: Median sternotomy wires. There are no aggressive appearing lytic or blastic lesions noted in the visualized portions of the skeleton. IMPRESSION: 1. Today's study demonstrates a positive response to therapy with partial regression of the previously noted left upper lobe mass as well as partial regression of left hilar and mediastinal lymphadenopathy, as detailed above. 2. Aortic atherosclerosis, in addition to left main and 3 vessel coronary artery disease. Please note that although the presence of coronary artery calcium documents the presence of coronary artery disease, the severity of this disease and any potential stenosis cannot be assessed on this non-gated CT examination. Assessment  for potential risk factor modification, dietary therapy or pharmacologic therapy may be warranted, if clinically indicated. Aortic Atherosclerosis (ICD10-I70.0). Electronically Signed   By: Vinnie Langton M.D.   On: 01/21/2020 11:20   MR Abdomen W Wo Contrast  Result Date: 01/12/2020 CLINICAL DATA:  Evaluate right renal lesion seen on recent PET-CT scan 10/14/2019. Known left lung cancer. EXAM: MRI  ABDOMEN WITHOUT AND WITH CONTRAST TECHNIQUE: Multiplanar multisequence MR imaging of the abdomen was performed both before and after the administration of intravenous contrast. CONTRAST:  69m GADAVIST GADOBUTROL 1 MMOL/ML IV SOLN COMPARISON:  PET-CT 10/14/2019 FINDINGS: Lower chest: The lung bases are clear. No pulmonary lesions pleural effusion. No pericardial effusion. Hepatobiliary: No hepatic lesions to suggest metastatic disease. The gallbladder is normal. No intrahepatic biliary dilatation. Normal caliber and course of the common bile duct. Pancreas:  No mass, inflammation or ductal dilatation. Spleen:  Normal size. No focal lesions. Adrenals/Urinary Tract:  The adrenal glands are normal. Right upper pole renal lesion measures approximately 3.2 x 3.2 cm. It has heterogeneous areas of low T2 and high T2 signal intensity. No increased T1 signal intensity to suggest hemorrhage. The lesion is diffusion negative but demonstrated hypermetabolism on the PET scan and demonstrates moderate contrast enhancement. Findings consistent with renal cell carcinoma. No other worrisome renal lesions are identified. The right renal vein is normal. No collecting system abnormalities. Stomach/Bowel: The stomach, duodenum, visualized small bowel and visualized colon are unremarkable. Vascular/Lymphatic: The aorta is normal in caliber. The branch vessels are normal. The major venous structures are patent. No mesenteric or retroperitoneal adenopathy. Other:  No ascites or abdominal wall hernia. Musculoskeletal: Normal bony structures. No worrisome bone lesions. IMPRESSION: 1. 3.2 x 3.2 cm upper pole right renal lesion consistent with renal cell carcinoma. 2. No other worrisome renal lesions. 3. No abdominal lymphadenopathy or metastatic disease. Electronically Signed   By: PMarijo SanesM.D.   On: 01/12/2020 13:02     Assessment and plan- Patient is a 69y.o. male  with stage IIIc squamous cell carcinoma of the lung.  He is here  for on treatment assessment prior to cycle 1 of maintenance durvalumab  I have reviewed CT chest  images independently and discussed findings with the patient.  Overall there has been some regression in his left upper lobe mass as well as hilar and mediastinal adenopathy indicative of partial response.  He has completed his chemoradiation and will move on to maintenance durvalumab which she will get every 2 weeks for 1 year.  Discussed risks and benefits of durvalumab including all but not limited to autoimmune thyroiditis, colitis, dermatitis, need to monitor kidney and thyroid functions.  Treatment will be given with a curative intent.  Patient understands and agrees to proceed as planned  Right renal mass: Being monitored by urology  He will proceed with cycle 1 of durvalumab today I will see him back in 2 weeks for cycle 2  Radiation esophagitis: Continue Carafate.  He is also taking as needed liquid morphine and I will increase his dose at this time  Visit Diagnosis 1. [place]   2. Encounter for antineoplastic immunotherapy   3. Radiation esophagitis      Dr. ARanda Evens MD, MPH [id number]/08/2020 8:26 AM

## 2020-02-10 ENCOUNTER — Encounter: Payer: Self-pay | Admitting: Radiation Oncology

## 2020-02-11 ENCOUNTER — Ambulatory Visit: Payer: Medicare Other | Admitting: Radiation Oncology

## 2020-02-12 ENCOUNTER — Other Ambulatory Visit: Payer: Self-pay

## 2020-02-12 ENCOUNTER — Other Ambulatory Visit: Payer: Self-pay | Admitting: Oncology

## 2020-02-12 ENCOUNTER — Encounter: Payer: Self-pay | Admitting: Oncology

## 2020-02-12 DIAGNOSIS — C3412 Malignant neoplasm of upper lobe, left bronchus or lung: Secondary | ICD-10-CM

## 2020-02-12 NOTE — Progress Notes (Signed)
Patient stated that he had been doing better with his throat and that he is able to swallow but it burns. Patient stated that he had been able to get his Sertraline but he has not started it because he does not feel that he needs it.

## 2020-02-13 ENCOUNTER — Inpatient Hospital Stay: Payer: Medicare Other

## 2020-02-13 ENCOUNTER — Other Ambulatory Visit: Payer: Self-pay

## 2020-02-13 ENCOUNTER — Inpatient Hospital Stay (HOSPITAL_BASED_OUTPATIENT_CLINIC_OR_DEPARTMENT_OTHER): Payer: Medicare Other | Admitting: Oncology

## 2020-02-13 VITALS — BP 151/137 | HR 61 | Temp 96.8°F | Resp 18 | Wt 239.1 lb

## 2020-02-13 VITALS — BP 116/76 | HR 84 | Resp 18

## 2020-02-13 DIAGNOSIS — Z5112 Encounter for antineoplastic immunotherapy: Secondary | ICD-10-CM

## 2020-02-13 DIAGNOSIS — F419 Anxiety disorder, unspecified: Secondary | ICD-10-CM

## 2020-02-13 DIAGNOSIS — K208 Other esophagitis without bleeding: Secondary | ICD-10-CM | POA: Diagnosis not present

## 2020-02-13 DIAGNOSIS — C3412 Malignant neoplasm of upper lobe, left bronchus or lung: Secondary | ICD-10-CM

## 2020-02-13 DIAGNOSIS — T66XXXA Radiation sickness, unspecified, initial encounter: Secondary | ICD-10-CM

## 2020-02-13 DIAGNOSIS — Z95828 Presence of other vascular implants and grafts: Secondary | ICD-10-CM

## 2020-02-13 LAB — COMPREHENSIVE METABOLIC PANEL
ALT: 18 U/L (ref 0–44)
AST: 21 U/L (ref 15–41)
Albumin: 3.9 g/dL (ref 3.5–5.0)
Alkaline Phosphatase: 64 U/L (ref 38–126)
Anion gap: 11 (ref 5–15)
BUN: 18 mg/dL (ref 8–23)
CO2: 24 mmol/L (ref 22–32)
Calcium: 9 mg/dL (ref 8.9–10.3)
Chloride: 101 mmol/L (ref 98–111)
Creatinine, Ser: 0.71 mg/dL (ref 0.61–1.24)
GFR calc Af Amer: >60 mL/min (ref >60)
GFR calc non Af Amer: >60 mL/min (ref >60)
Glucose, Bld: 177 mg/dL — ABNORMAL HIGH (ref 70–99)
Potassium: 3.4 mmol/L — ABNORMAL LOW (ref 3.5–5.1)
Sodium: 136 mmol/L (ref 135–145)
Total Bilirubin: 0.4 mg/dL (ref 0.3–1.2)
Total Protein: 7.3 g/dL (ref 6.5–8.1)

## 2020-02-13 LAB — CBC WITH DIFFERENTIAL/PLATELET
Abs Immature Granulocytes: 0.02 10*3/uL (ref 0.00–0.07)
Basophils Absolute: 0 10*3/uL (ref 0.0–0.1)
Basophils Relative: 1 %
Eosinophils Absolute: 0.1 10*3/uL (ref 0.0–0.5)
Eosinophils Relative: 3 %
HCT: 37.1 % — ABNORMAL LOW (ref 39.0–52.0)
Hemoglobin: 12.3 g/dL — ABNORMAL LOW (ref 13.0–17.0)
Immature Granulocytes: 0 %
Lymphocytes Relative: 20 %
Lymphs Abs: 1 10*3/uL (ref 0.7–4.0)
MCH: 31.9 pg (ref 26.0–34.0)
MCHC: 33.2 g/dL (ref 30.0–36.0)
MCV: 96.1 fL (ref 80.0–100.0)
Monocytes Absolute: 0.7 10*3/uL (ref 0.1–1.0)
Monocytes Relative: 15 %
Neutro Abs: 2.8 10*3/uL (ref 1.7–7.7)
Neutrophils Relative %: 61 %
Platelets: 276 10*3/uL (ref 150–400)
RBC: 3.86 MIL/uL — ABNORMAL LOW (ref 4.22–5.81)
RDW: 16.2 % — ABNORMAL HIGH (ref 11.5–15.5)
WBC: 4.7 10*3/uL (ref 4.0–10.5)
nRBC: 0 % (ref 0.0–0.2)

## 2020-02-13 LAB — TSH: TSH: 0.194 u[IU]/mL — ABNORMAL LOW (ref 0.350–4.500)

## 2020-02-13 MED ORDER — HEPARIN SOD (PORK) LOCK FLUSH 100 UNIT/ML IV SOLN
INTRAVENOUS | Status: AC
  Filled 2020-02-13: qty 5

## 2020-02-13 MED ORDER — LORAZEPAM 0.5 MG PO TABS: 0.5000 mg | ORAL_TABLET | Freq: Four times a day (QID) | ORAL | 0 refills | Status: DC | PRN

## 2020-02-13 MED ORDER — SODIUM CHLORIDE 0.9 % IV SOLN
Freq: Once | INTRAVENOUS | Status: AC
  Filled 2020-02-13: qty 250

## 2020-02-13 MED ORDER — SODIUM CHLORIDE 0.9 % IV SOLN
1100.0000 mg | Freq: Once | INTRAVENOUS | Status: AC
  Administered 2020-02-13: 1100 mg via INTRAVENOUS
  Filled 2020-02-13: qty 10

## 2020-02-13 MED ORDER — HEPARIN SOD (PORK) LOCK FLUSH 100 UNIT/ML IV SOLN
500.0000 [IU] | Freq: Once | INTRAVENOUS | Status: AC | PRN
  Administered 2020-02-13: 500 [IU]
  Filled 2020-02-13: qty 5

## 2020-02-13 MED ORDER — SODIUM CHLORIDE 0.9% FLUSH
10.0000 mL | Freq: Once | INTRAVENOUS | Status: AC
  Administered 2020-02-13: 13:00:00 10 mL via INTRAVENOUS
  Filled 2020-02-13: qty 10

## 2020-02-13 NOTE — Progress Notes (Signed)
Changing dose of Imfinzi today from 1120mg  to 1100mg , due to plasma order not being delivered this week from [place] and not enough drug stock.  MD aware.

## 2020-02-13 NOTE — Progress Notes (Signed)
Pt not taking depression med. He states that a pill is not going to help the source of his problem which is his wife that is in so much pain because she needs hip replacement and  Won't stop smoking and does not keep sugar in a good range. This week also had plumbing problems that has added extra stress. The condition of his wife is his source of stress and he will not leave her and daughter has asked in past to live with her but he would not leave his wife. She needs his help to do things. He did try the antidepressant and had tried one in the past and felt jittery and anxious and did not like side effects

## 2020-02-14 LAB — T4: T4, Total: 8.8 ug/dL (ref 4.5–12.0)

## 2020-02-16 NOTE — Progress Notes (Signed)
Hematology/Oncology Consult note [facility]  [phone] [phone]  Patient Care Team: Center, [facility] as PCP - General ([place]) Telford Nab, RN as Registered Nurse   Name of the patient: Derrick Gallegos  [id number]  [birth year]   Date of visit: 02/16/20  Diagnosis- Stage IIIC SCC lung [id number]  Chief complaint/ Reason for visit-on treatment assessment prior to cycle 2 of maintenance durvalumab  Heme/Onc history: patient is a 69 year old malewho was a former smoker and smoked about 2 to 3 packs/day for many years and quit smoking a few years ago. He presented to the ER with symptoms of cough and shortness of breathWhich prompted [place] chest x-ray followed by a CT scan. CT scan showed a 3.4 x 3.2 cm left perihilar mass along the left paratracheal precarinal and subcarinal vascular and left hilar adenopathy. Patient has been referred for further management.  PET CT scan showed a large left upper lobe lung mass measuring 6.3 x 3.4 cm. Bilateral mediastinal adenopathy as well as left supraclavicular adenopathy. 3.4 cm partially exophytic lesion of the right kidney. No evidence of distant metastatic disease.  Cycle 1 of weekly carbotaxol chemotherapy with radiation started on 11/17/2019   Patient has met with Dr. Erlene Quan for right renal mass which is being monitored for now and consideration for cryoablation in the future   Interval history-reports that pain with swallowing is gradually getting better.  Also has ongoing fatigue and anxiety issues.  Lorazepam has been helping with his anxiety.  ECOG PS- 1 Pain scale- 3 Opioid associated constipation- no  Review of systems- Review of Systems  Constitutional: Positive for malaise/fatigue. Negative for chills, fever and weight loss.  HENT: Negative for congestion, ear discharge and nosebleeds.   Eyes: Negative for blurred vision.  Respiratory:  Negative for cough, hemoptysis, sputum production, shortness of breath and wheezing.   Cardiovascular: Negative for chest pain, palpitations, orthopnea and claudication.  Gastrointestinal: Negative for abdominal pain, blood in stool, constipation, diarrhea, heartburn, melena, nausea and vomiting.       Pain during swallowing  Genitourinary: Negative for dysuria, flank pain, frequency, hematuria and urgency.  Musculoskeletal: Negative for back pain, joint pain and myalgias.  Skin: Negative for rash.  Neurological: Negative for dizziness, tingling, focal weakness, seizures, weakness and headaches.  Endo/Heme/Allergies: Does not bruise/bleed easily.  Psychiatric/Behavioral: Negative for depression and suicidal ideas. The patient is nervous/anxious. The patient does not have insomnia.     No Known Allergies   Past Medical History:  Diagnosis Date  . Anxiety   . Arthritis   . Atrial flutter ([place])    a. diagnosed 5/19; b. s/p TEE/DCCV 05/2018 by Dr. Humphrey Rolls; c. CHADS2VASc => 4 (HTN, age x1, DM, vascular disease)  . Complication of anesthesia    "wakes up too early"  . Coronary artery disease    a. s/p 3-v cabg in 2004 at [place] (LIMA and radial arery used - no further details in Care Everywhere); b. LHC 8/19 underlying multi-V dz, patent LIMA-LAD, occluded VG-PDA, 90-95% stenosis of LCx s/p PCI/DES x 2  . Cough   . Depression   . Diabetes mellitus with complication ([place])   . Dyspnea   . Dysrhythmia    atrial fib.  . Essential hypertension   . Family history of premature CAD    a. father passed away from an MI at 17  . GERD (gastroesophageal reflux disease)   . Heart murmur   . HLD (hyperlipidemia)  a. statin intolerant   . Lung cancer ([place]) 09/2019   Chemo + Rad tx's.   . Mass of left lung   . MI (myocardial infarction) ([place])    2004  . Obesity      Past Surgical History:  Procedure Laterality Date  . CARDIAC CATHETERIZATION  2004  . CARDIAC CATHETERIZATION  2019   x2 stents  [facility]  . CARDIAC SURGERY    . CARDIOVERSION N/A 06/20/2018   Procedure: CARDIOVERSION;  Surgeon: Dionisio David, MD;  Location: [facility] ORS;  Service: Cardiovascular;  Laterality: N/A;  . CORONARY ARTERY BYPASS GRAFT    . CORONARY STENT INTERVENTION N/A 08/08/2018   Procedure: CORONARY STENT INTERVENTION;  Surgeon: Isaias Cowman, MD;  Location: [place] CV LAB;  Service: Cardiovascular;  Laterality: N/A;  . LEFT HEART CATH AND CORONARY ANGIOGRAPHY Left 08/08/2018   Procedure: LEFT HEART CATH AND CORONARY ANGIOGRAPHY;  Surgeon: Dionisio David, MD;  Location: [place] CV LAB;  Service: Cardiovascular;  Laterality: Left;  . PORTA CATH INSERTION N/A 11/10/2019   Procedure: PORTA CATH INSERTION;  Surgeon: Algernon Huxley, MD;  Location: [place] CV LAB;  Service: Cardiovascular;  Laterality: N/A;  . TEE WITHOUT CARDIOVERSION N/A 06/20/2018   Procedure: TRANSESOPHAGEAL ECHOCARDIOGRAM (TEE);  Surgeon: Dionisio David, MD;  Location: [facility] ORS;  Service: Cardiovascular;  Laterality: N/A;  . VIDEO BRONCHOSCOPY N/A 10/29/2019   Procedure: VIDEO BRONCHOSCOPY WITH ENDOBRONCHICAL ULTRASOUND;  Surgeon: Tyler Pita, MD;  Location: [facility] ORS;  Service: Cardiopulmonary;  Laterality: N/A;    Social History   Socioeconomic History  . Marital status: Married    Spouse name: Not on file  . Number of children: Not on file  . Years of education: Not on file  . Highest education level: Not on file  Occupational History  . Not on file  Tobacco Use  . Smoking status: Former Smoker    Types: Cigarettes    Quit date: 2004    Years since quitting: 17.1  . Smokeless tobacco: Current User  . Tobacco comment: Quit 2004  Substance and Sexual Activity  . Alcohol use: No  . Drug use: No  . Sexual activity: Not Currently  Other Topics Concern  . Not on file  Social History Narrative  . Not on file   Social Determinants of Health   Financial Resource Strain:   . Difficulty of Paying Living  Expenses: Not on file  Food Insecurity:   . Worried About Charity fundraiser in the Last Year: Not on file  . Ran Out of Food in the Last Year: Not on file  Transportation Needs:   . Lack of Transportation (Medical): Not on file  . Lack of Transportation (Non-Medical): Not on file  Physical Activity:   . Days of Exercise per Week: Not on file  . Minutes of Exercise per Session: Not on file  Stress:   . Feeling of Stress : Not on file  Social Connections:   . Frequency of Communication with Friends and Family: Not on file  . Frequency of Social Gatherings with Friends and Family: Not on file  . Attends Religious Services: Not on file  . Active Member of Clubs or Organizations: Not on file  . Attends Archivist Meetings: Not on file  . Marital Status: Not on file  Intimate Partner Violence:   . Fear of Current or Ex-Partner: Not on file  . Emotionally Abused: Not on file  . Physically Abused: Not  on file  . Sexually Abused: Not on file    Family History  Problem Relation Age of Onset  . CAD Mother   . CAD Father        a. MI at 57-->death  . CAD Brother      Current Outpatient Medications:  .  acetaminophen (TYLENOL) 500 MG tablet, Take 1,000 mg by mouth every 6 (six) hours as needed for moderate pain. , Disp: , Rfl:  .  dexamethasone (DECADRON) 4 MG tablet, Take 1 tablet (4 mg total) by mouth 2 (two) times daily with a meal., Disp: 28 tablet, Rfl: 0 .  empagliflozin (JARDIANCE) 25 MG TABS tablet, Take 25 mg by mouth daily., Disp: , Rfl:  .  ezetimibe (ZETIA) 10 MG tablet, Take 1 tablet (10 mg total) by mouth daily., Disp: 30 tablet, Rfl: 3 .  gabapentin (NEURONTIN) 300 MG capsule, Take 300 mg by mouth at bedtime. , Disp: , Rfl:  .  glipiZIDE (GLUCOTROL) 10 MG tablet, Take 10 mg by mouth 2 (two) times daily before a meal., Disp: , Rfl:  .  losartan (COZAAR) 50 MG tablet, Take 50 mg by mouth daily. , Disp: , Rfl:  .  metoprolol tartrate (LOPRESSOR) 25 MG tablet,  Take 1 tablet (25 mg total) by mouth 2 (two) times daily. (Patient taking differently: Take 25 mg by mouth 1 day or 1 dose. ), Disp: 180 tablet, Rfl: 0 .  morphine (ROXANOL) 20 MG/ML concentrated solution, Take 1 mL (20 mg total) by mouth every 4 (four) hours as needed for severe pain., Disp: 60 mL, Rfl: 0 .  Omega-3 Fatty Acids (FISH OIL) 1200 MG CAPS, Take 2,400 mg by mouth at bedtime. , Disp: , Rfl:  .  omeprazole (PRILOSEC) 20 MG capsule, Take 1 capsule by mouth as needed., Disp: , Rfl:  .  rivaroxaban (XARELTO) 20 MG TABS tablet, Take 1 tablet (20 mg total) by mouth daily with supper., Disp: 90 tablet, Rfl: 2 .  sucralfate (CARAFATE) 1 g tablet, Take 1 tablet (1 g total) by mouth 4 (four) times daily -  with meals and at bedtime., Disp: 120 tablet, Rfl: 6 .  Glycopyrrolate-Formoterol (BEVESPI AEROSPHERE) 9-4.8 MCG/ACT AERO, Inhale 2 puffs into the lungs 2 (two) times daily. (Patient not taking: Reported on 02/12/2020), Disp: 10.7 g, Rfl: 11 .  HYDROcodone-homatropine (HYCODAN) 5-1.5 MG/5ML syrup, Take 5 mLs by mouth every 6 (six) hours as needed for cough. (Patient not taking: Reported on 02/12/2020), Disp: 120 mL, Rfl: 0 .  LORazepam (ATIVAN) 0.5 MG tablet, Take 1 tablet (0.5 mg total) by mouth every 6 (six) hours as needed for anxiety., Disp: 30 tablet, Rfl: 0 .  sertraline (ZOLOFT) 25 MG tablet, Take 1 tablet (25 mg total) by mouth daily. (Patient not taking: Reported on 02/12/2020), Disp: 90 tablet, Rfl: 0  Physical exam:  Vitals:   02/13/20 1319 02/13/20 1336 02/13/20 1340  BP:  (!) 151/137   Pulse:  61   Resp:  18   Temp:   (!) 96.8 F (36 C)  TempSrc:  Tympanic Tympanic  Weight: 239 lb 1.6 oz (108.5 kg) 239 lb 1.6 oz (108.5 kg)    Physical Exam Constitutional:      General: He is not in acute distress. HENT:     Head: Normocephalic and atraumatic.  Eyes:     Pupils: Pupils are equal, round, and reactive to light.  Cardiovascular:     Rate and Rhythm: Normal rate and regular  rhythm.  Heart sounds: Normal heart sounds.  Pulmonary:     Effort: Pulmonary effort is normal.     Breath sounds: Normal breath sounds.  Abdominal:     General: Bowel sounds are normal.     Palpations: Abdomen is soft.  Musculoskeletal:     Cervical back: Normal range of motion.  Skin:    General: Skin is warm and dry.  Neurological:     Mental Status: He is alert and oriented to person, place, and time.      CMP Latest Ref Rng & Units 02/13/2020  Glucose 70 - 99 mg/dL 177(H)  BUN 8 - 23 mg/dL 18  Creatinine 0.61 - 1.24 mg/dL 0.71  Sodium 135 - 145 mmol/L 136  Potassium 3.5 - 5.1 mmol/L 3.4(L)  Chloride 98 - 111 mmol/L 101  CO2 22 - 32 mmol/L 24  Calcium 8.9 - 10.3 mg/dL 9.0  Total Protein 6.5 - 8.1 g/dL 7.3  Total Bilirubin 0.3 - 1.2 mg/dL 0.4  Alkaline Phos 38 - 126 U/L 64  AST 15 - 41 U/L 21  ALT 0 - 44 U/L 18   CBC Latest Ref Rng & Units 02/13/2020  WBC 4.0 - 10.5 K/uL 4.7  Hemoglobin 13.0 - 17.0 g/dL 12.3(L)  Hematocrit 39.0 - 52.0 % 37.1(L)  Platelets 150 - 400 K/uL 276    No images are attached to the encounter.  CT Chest W Contrast  Result Date: 01/21/2020 CLINICAL DATA:  69 year old male with history of left upper lobe lung cancer. EXAM: CT CHEST WITH CONTRAST TECHNIQUE: Multidetector CT imaging of the chest was performed during intravenous contrast administration. CONTRAST:  65m OMNIPAQUE IOHEXOL 300 MG/ML  SOLN COMPARISON:  Chest CT 10/09/2019.  PET-CT 10/14/2019. FINDINGS: Cardiovascular: Heart size is normal. There is no significant pericardial fluid, thickening or pericardial calcification. There is aortic atherosclerosis, as well as atherosclerosis of the great vessels of the mediastinum and the coronary arteries, including calcified atherosclerotic plaque in the left main, left anterior descending, left circumflex and right coronary arteries. Mediastinum/Nodes: Multiple enlarged mediastinal and left hilar lymph nodes are again noted, but decreased in size  compared to the prior examination. The largest residual left hilar lymph node currently measures 1.4 cm in short axis. The largest mediastinal lymph node is a right paratracheal lymph node measuring 2.2 cm in short axis (previously 3.1 cm). Esophagus is unremarkable in appearance. No axillary lymphadenopathy. Lungs/Pleura: Regression of previously noted left upper lobe mass which is currently in ill-defined area of soft tissue prominence and architectural distortion in the suprahilar aspect of the left upper lobe (axial image 51 of series 3) measuring approximately 2.4 x 2.5 cm. Postobstructive changes in the left upper lobe with areas of atelectasis and patchy areas of ground-glass attenuation in the left upper lobe distal to the lesion. No new suspicious appearing pulmonary nodules or masses are noted. No pleural effusions. Upper Abdomen: Aortic atherosclerosis. Musculoskeletal: Median sternotomy wires. There are no aggressive appearing lytic or blastic lesions noted in the visualized portions of the skeleton. IMPRESSION: 1. Today's study demonstrates a positive response to therapy with partial regression of the previously noted left upper lobe mass as well as partial regression of left hilar and mediastinal lymphadenopathy, as detailed above. 2. Aortic atherosclerosis, in addition to left main and 3 vessel coronary artery disease. Please note that although the presence of coronary artery calcium documents the presence of coronary artery disease, the severity of this disease and any potential stenosis cannot be assessed on this non-gated CT  examination. Assessment for potential risk factor modification, dietary therapy or pharmacologic therapy may be warranted, if clinically indicated. Aortic Atherosclerosis (ICD10-I70.0). Electronically Signed   By: Vinnie Langton M.D.   On: 01/21/2020 11:20     Assessment and plan- Patient is a 69 y.o. 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   He is here  for on treatment assessment prior to cycle 2 of maintenance durvalumab  Counts okay to proceed with cycle 2 of maintenance durvalumab today.  I will see him back in 2 weeks for cycle 3.  Plan to get scans after 6 treatments again.  Radiation esophagitis: Continue as needed liquid morphine as well as Carafate.  Anxiety: Continue as needed Ativan    Visit Diagnosis 1. Encounter for antineoplastic immunotherapy   2. Malignant neoplasm of upper lobe of left lung (HCC)   3. Radiation esophagitis   4. Anxiety      [name], MD, MPH [facility] [phone] 02/16/2020 1:05 PM

## 2020-02-23 ENCOUNTER — Telehealth: Payer: Self-pay | Admitting: Internal Medicine

## 2020-02-23 NOTE — Telephone Encounter (Signed)
3 attempts to schedule fu appt from recall list.   Deleting recall.   

## 2020-02-27 ENCOUNTER — Other Ambulatory Visit: Payer: Self-pay

## 2020-02-27 ENCOUNTER — Encounter: Payer: Self-pay | Admitting: Radiation Oncology

## 2020-02-27 ENCOUNTER — Inpatient Hospital Stay: Payer: Medicare Other | Attending: Oncology

## 2020-02-27 ENCOUNTER — Inpatient Hospital Stay: Payer: Medicare Other

## 2020-02-27 ENCOUNTER — Ambulatory Visit
Admission: RE | Admit: 2020-02-27 | Discharge: 2020-02-27 | Disposition: A | Discharge status: Home / Self Care | Payer: Medicare Other | Source: Ambulatory Visit | Attending: Radiation Oncology | Admitting: Radiation Oncology

## 2020-02-27 ENCOUNTER — Other Ambulatory Visit: Payer: Self-pay | Admitting: Pharmacist

## 2020-02-27 ENCOUNTER — Inpatient Hospital Stay (HOSPITAL_BASED_OUTPATIENT_CLINIC_OR_DEPARTMENT_OTHER): Payer: Medicare Other | Admitting: Oncology

## 2020-02-27 ENCOUNTER — Encounter: Payer: Self-pay | Admitting: Oncology

## 2020-02-27 VITALS — BP 123/100 | HR 69 | Temp 96.9°F | Wt 236.0 lb

## 2020-02-27 VITALS — BP 120/76 | HR 85 | Temp 96.9°F | Resp 16 | Wt 237.3 lb

## 2020-02-27 VITALS — BP 120/80 | HR 80

## 2020-02-27 DIAGNOSIS — R5383 Other fatigue: Secondary | ICD-10-CM | POA: Insufficient documentation

## 2020-02-27 DIAGNOSIS — R519 Headache, unspecified: Secondary | ICD-10-CM | POA: Diagnosis not present

## 2020-02-27 DIAGNOSIS — I10 Essential (primary) hypertension: Secondary | ICD-10-CM | POA: Insufficient documentation

## 2020-02-27 DIAGNOSIS — K219 Gastro-esophageal reflux disease without esophagitis: Secondary | ICD-10-CM | POA: Diagnosis not present

## 2020-02-27 DIAGNOSIS — Z79899 Other long term (current) drug therapy: Secondary | ICD-10-CM | POA: Diagnosis not present

## 2020-02-27 DIAGNOSIS — Z5112 Encounter for antineoplastic immunotherapy: Secondary | ICD-10-CM | POA: Diagnosis present

## 2020-02-27 DIAGNOSIS — I252 Old myocardial infarction: Secondary | ICD-10-CM | POA: Diagnosis not present

## 2020-02-27 DIAGNOSIS — E1159 Type 2 diabetes mellitus with other circulatory complications: Secondary | ICD-10-CM | POA: Insufficient documentation

## 2020-02-27 DIAGNOSIS — Z87891 Personal history of nicotine dependence: Secondary | ICD-10-CM | POA: Diagnosis not present

## 2020-02-27 DIAGNOSIS — I2510 Atherosclerotic heart disease of native coronary artery without angina pectoris: Secondary | ICD-10-CM | POA: Diagnosis not present

## 2020-02-27 DIAGNOSIS — C3490 Malignant neoplasm of unspecified part of unspecified bronchus or lung: Secondary | ICD-10-CM

## 2020-02-27 DIAGNOSIS — E785 Hyperlipidemia, unspecified: Secondary | ICD-10-CM | POA: Insufficient documentation

## 2020-02-27 DIAGNOSIS — Z923 Personal history of irradiation: Secondary | ICD-10-CM | POA: Insufficient documentation

## 2020-02-27 DIAGNOSIS — F419 Anxiety disorder, unspecified: Secondary | ICD-10-CM

## 2020-02-27 DIAGNOSIS — Z951 Presence of aortocoronary bypass graft: Secondary | ICD-10-CM | POA: Insufficient documentation

## 2020-02-27 DIAGNOSIS — F418 Other specified anxiety disorders: Secondary | ICD-10-CM | POA: Insufficient documentation

## 2020-02-27 DIAGNOSIS — Z9221 Personal history of antineoplastic chemotherapy: Secondary | ICD-10-CM | POA: Insufficient documentation

## 2020-02-27 DIAGNOSIS — R109 Unspecified abdominal pain: Secondary | ICD-10-CM | POA: Insufficient documentation

## 2020-02-27 DIAGNOSIS — R5381 Other malaise: Secondary | ICD-10-CM | POA: Insufficient documentation

## 2020-02-27 DIAGNOSIS — C3412 Malignant neoplasm of upper lobe, left bronchus or lung: Secondary | ICD-10-CM | POA: Insufficient documentation

## 2020-02-27 DIAGNOSIS — Z95828 Presence of other vascular implants and grafts: Secondary | ICD-10-CM

## 2020-02-27 DIAGNOSIS — M1990 Unspecified osteoarthritis, unspecified site: Secondary | ICD-10-CM | POA: Diagnosis not present

## 2020-02-27 DIAGNOSIS — Z7901 Long term (current) use of anticoagulants: Secondary | ICD-10-CM | POA: Insufficient documentation

## 2020-02-27 LAB — CBC WITH DIFFERENTIAL/PLATELET
Abs Immature Granulocytes: 0.04 K/uL (ref 0.00–0.07)
Basophils Absolute: 0 K/uL (ref 0.0–0.1)
Basophils Relative: 1 %
Eosinophils Absolute: 0.2 K/uL (ref 0.0–0.5)
Eosinophils Relative: 3 %
HCT: 39.4 % (ref 39.0–52.0)
Hemoglobin: 13 g/dL (ref 13.0–17.0)
Immature Granulocytes: 1 %
Lymphocytes Relative: 13 %
Lymphs Abs: 0.8 K/uL (ref 0.7–4.0)
MCH: 31.3 pg (ref 26.0–34.0)
MCHC: 33 g/dL (ref 30.0–36.0)
MCV: 94.9 fL (ref 80.0–100.0)
Monocytes Absolute: 0.9 K/uL (ref 0.1–1.0)
Monocytes Relative: 16 %
Neutro Abs: 3.9 K/uL (ref 1.7–7.7)
Neutrophils Relative %: 66 %
Platelets: 243 K/uL (ref 150–400)
RBC: 4.15 MIL/uL — ABNORMAL LOW (ref 4.22–5.81)
RDW: 14.3 % (ref 11.5–15.5)
WBC: 5.8 K/uL (ref 4.0–10.5)
nRBC: 0 % (ref 0.0–0.2)

## 2020-02-27 LAB — COMPREHENSIVE METABOLIC PANEL WITH GFR
ALT: 13 U/L (ref 0–44)
AST: 16 U/L (ref 15–41)
Albumin: 4 g/dL (ref 3.5–5.0)
Alkaline Phosphatase: 77 U/L (ref 38–126)
Anion gap: 10 (ref 5–15)
BUN: 15 mg/dL (ref 8–23)
CO2: 22 mmol/L (ref 22–32)
Calcium: 9.2 mg/dL (ref 8.9–10.3)
Chloride: 103 mmol/L (ref 98–111)
Creatinine, Ser: 0.66 mg/dL (ref 0.61–1.24)
GFR calc Af Amer: >60 mL/min
GFR calc non Af Amer: >60 mL/min
Glucose, Bld: 182 mg/dL — ABNORMAL HIGH (ref 70–99)
Potassium: 4 mmol/L (ref 3.5–5.1)
Sodium: 135 mmol/L (ref 135–145)
Total Bilirubin: 0.7 mg/dL (ref 0.3–1.2)
Total Protein: 7.3 g/dL (ref 6.5–8.1)

## 2020-02-27 MED ORDER — SODIUM CHLORIDE 0.9% FLUSH
10.0000 mL | Freq: Once | INTRAVENOUS | Status: AC
  Administered 2020-02-27: 10 mL via INTRAVENOUS
  Filled 2020-02-27: qty 10

## 2020-02-27 MED ORDER — SODIUM CHLORIDE 0.9 % IV SOLN
Freq: Once | INTRAVENOUS | Status: AC
  Filled 2020-02-27: qty 250

## 2020-02-27 MED ORDER — LORAZEPAM 0.5 MG PO TABS: 0.5000 mg | ORAL_TABLET | Freq: Two times a day (BID) | ORAL | 0 refills | Status: DC | PRN

## 2020-02-27 MED ORDER — SODIUM CHLORIDE 0.9 % IV SOLN
10.0000 mg/kg | Freq: Once | INTRAVENOUS | Status: AC
  Administered 2020-02-27: 1120 mg via INTRAVENOUS
  Filled 2020-02-27: qty 20

## 2020-02-27 MED ORDER — HEPARIN SOD (PORK) LOCK FLUSH 100 UNIT/ML IV SOLN
500.0000 [IU] | Freq: Once | INTRAVENOUS | Status: AC | PRN
  Administered 2020-02-27: 500 [IU]
  Filled 2020-02-27: qty 5

## 2020-02-27 MED ORDER — HEPARIN SOD (PORK) LOCK FLUSH 100 UNIT/ML IV SOLN
INTRAVENOUS | Status: AC
  Filled 2020-02-27: qty 5

## 2020-02-27 NOTE — Progress Notes (Signed)
Radiation Oncology Follow up Note  Name: Derrick Gallegos   Date:   02/27/2020 MRN:  [id number] DOB: [birth year]    This 69 y.o. male presents to the clinic today for 1 month follow-up status post concurrent chemoradiation therapy for stage IIIc squamous cell carcinoma the left lung.  REFERRING PROVIDER: [facility], [name]  HPI: Patient is a 69 year old male now out 1 month having completed concurrent chemoradiation therapy for stage IIIc non-small cell lung cancer of the left lung.  Seen today in routine follow-up he is doing well.  He specifically denies cough hemoptysis chest tightness or dysphagia.Marland Kitchen  He is currently onmaintenance durvalumab and tolerating that well.  COMPLICATIONS OF TREATMENT: none  FOLLOW UP COMPLIANCE: keeps appointments   PHYSICAL EXAM:  BP 120/76   Pulse 85   Temp (!) 96.9 F (36.1 C) (Tympanic)   Resp 16   Wt 237 lb 4.8 oz (107.6 kg)   BMI 33.10 kg/m  Well-developed well-nourished patient in NAD. HEENT reveals PERLA, EOMI, discs not visualized.  Oral cavity is clear. No oral mucosal lesions are identified. Neck is clear without evidence of cervical or supraclavicular adenopathy. Lungs are clear to A&P. Cardiac examination is essentially unremarkable with regular rate and rhythm without murmur rub or thrill. Abdomen is benign with no organomegaly or masses noted. Motor sensory and DTR levels are equal and symmetric in the upper and lower extremities. Cranial nerves II through XII are grossly intact. Proprioception is intact. No peripheral adenopathy or edema is identified. No motor or sensory levels are noted. Crude visual fields are within normal range.  RADIOLOGY RESULTS: No current films to review  PLAN: Present time patient is doing well very low side effect profile from his concurrent chemoradiation.  He continues onmaintenance durvalumab which she is tolerating well.  I have asked to see him back in 3 months for follow-up.  Anticipate CT scan of the chest  at that time will order one if not already been ordered.  Continues close follow-up with medical oncology.  Patient knows to call with any concerns.  I would like to take this opportunity to thank you for allowing me to participate in the care of your patient.Noreene Filbert, MD

## 2020-02-27 NOTE — Progress Notes (Signed)
Patient stated that he had been feeling tired and fatigued after doing yard work. Patient would like a refill on his Lorazepam.

## 2020-02-28 ENCOUNTER — Encounter: Payer: Self-pay | Admitting: Oncology

## 2020-03-01 ENCOUNTER — Other Ambulatory Visit: Payer: Self-pay | Admitting: *Deleted

## 2020-03-01 ENCOUNTER — Other Ambulatory Visit: Payer: Self-pay

## 2020-03-01 DIAGNOSIS — C3412 Malignant neoplasm of upper lobe, left bronchus or lung: Secondary | ICD-10-CM

## 2020-03-01 MED ORDER — LORAZEPAM 0.5 MG PO TABS: 0.5000 mg | ORAL_TABLET | Freq: Two times a day (BID) | ORAL | 0 refills | Status: DC | PRN

## 2020-03-01 NOTE — Progress Notes (Signed)
Hematology/Oncology Consult note [facility]  Telephone:([phone] [phone]  Patient Care Team: Center, [facility] as PCP - General ([place]) Telford Nab, RN as Registered Nurse   Name of the patient: Derrick Gallegos  [id number]  1951/08/03   Date of visit: 03/01/20  Diagnosis- Stage IIIC SCC lung [id number]  Chief complaint/ Reason for visit- on treatment assessment prior to cycle 3 of maintenace durvalumab  Heme/Onc history: patient is a 69 year old malewho was a former smoker and smoked about 2 to 3 packs/day for many years and quit smoking a few years ago. He presented to the ER with symptoms of cough and shortness of breathWhich prompted [place] chest x-ray followed by a CT scan. CT scan showed a 3.4 x 3.2 cm left perihilar mass along the left paratracheal precarinal and subcarinal vascular and left hilar adenopathy. Patient has been referred for further management.  PET CT scan showed a large left upper lobe lung mass measuring 6.3 x 3.4 cm. Bilateral mediastinal adenopathy as well as left supraclavicular adenopathy. 3.4 cm partially exophytic lesion of the right kidney. No evidence of distant metastatic disease.  Patient has completed concurrent chemo/RT. Scans showe dpartial response. Maintenance durvalumab started in feb 2021  Patient has met with Dr. Erlene Quan for right renal mass which is being monitored for now and consideration for cryoablation in the future   Interval history-patient reports doing well and denies any complaints at this time other than his chronic anxiety which is currently well controlled with Ativan.  Radiation esophagitis has resolved and patient is able to swallow better  ECOG PS- 1 Pain scale- 0   Review of systems- Review of Systems  Constitutional: Positive for malaise/fatigue. Negative for chills, fever and weight loss.  HENT: Negative for congestion, ear discharge and  nosebleeds.   Eyes: Negative for blurred vision.  Respiratory: Negative for cough, hemoptysis, sputum production, shortness of breath and wheezing.   Cardiovascular: Negative for chest pain, palpitations, orthopnea and claudication.  Gastrointestinal: Negative for abdominal pain, blood in stool, constipation, diarrhea, heartburn, melena, nausea and vomiting.  Genitourinary: Negative for dysuria, flank pain, frequency, hematuria and urgency.  Musculoskeletal: Negative for back pain, joint pain and myalgias.  Skin: Negative for rash.  Neurological: Negative for dizziness, tingling, focal weakness, seizures, weakness and headaches.  Endo/Heme/Allergies: Does not bruise/bleed easily.  Psychiatric/Behavioral: Negative for depression and suicidal ideas. The patient is nervous/anxious. The patient does not have insomnia.      No Known Allergies   Past Medical History:  Diagnosis Date  . Anxiety   . Arthritis   . Atrial flutter ([place])    a. diagnosed 5/19; b. s/p TEE/DCCV 05/2018 by Dr. Humphrey Rolls; c. CHADS2VASc => 4 (HTN, age x1, DM, vascular disease)  . Complication of anesthesia    "wakes up too early"  . Coronary artery disease    a. s/p 3-v cabg in 2004 at [place] (LIMA and radial arery used - no further details in Care Everywhere); b. LHC 8/19 underlying multi-V dz, patent LIMA-LAD, occluded VG-PDA, 90-95% stenosis of LCx s/p PCI/DES x 2  . Cough   . Depression   . Diabetes mellitus with complication ([place])   . Dyspnea   . Dysrhythmia    atrial fib.  . Essential hypertension   . Family history of premature CAD    a. father passed away from an MI at 64  . GERD (gastroesophageal reflux disease)   . Heart murmur   . HLD (  hyperlipidemia)    a. statin intolerant   . Lung cancer ([place]) 09/2019   Chemo + Rad tx's.   . Mass of left lung   . MI (myocardial infarction) ([place])    2004  . Obesity      Past Surgical History:  Procedure Laterality Date  . CARDIAC CATHETERIZATION  2004  .  CARDIAC CATHETERIZATION  2019   x2 stents [facility]  . CARDIAC SURGERY    . CARDIOVERSION N/A 06/20/2018   Procedure: CARDIOVERSION;  Surgeon: Dionisio David, MD;  Location: [facility] ORS;  Service: Cardiovascular;  Laterality: N/A;  . CORONARY ARTERY BYPASS GRAFT    . CORONARY STENT INTERVENTION N/A 08/08/2018   Procedure: CORONARY STENT INTERVENTION;  Surgeon: Isaias Cowman, MD;  Location: [place] CV LAB;  Service: Cardiovascular;  Laterality: N/A;  . LEFT HEART CATH AND CORONARY ANGIOGRAPHY Left 08/08/2018   Procedure: LEFT HEART CATH AND CORONARY ANGIOGRAPHY;  Surgeon: Dionisio David, MD;  Location: [place] CV LAB;  Service: Cardiovascular;  Laterality: Left;  . PORTA CATH INSERTION N/A 11/10/2019   Procedure: PORTA CATH INSERTION;  Surgeon: Algernon Huxley, MD;  Location: [place] CV LAB;  Service: Cardiovascular;  Laterality: N/A;  . TEE WITHOUT CARDIOVERSION N/A 06/20/2018   Procedure: TRANSESOPHAGEAL ECHOCARDIOGRAM (TEE);  Surgeon: Dionisio David, MD;  Location: [facility] ORS;  Service: Cardiovascular;  Laterality: N/A;  . VIDEO BRONCHOSCOPY N/A 10/29/2019   Procedure: VIDEO BRONCHOSCOPY WITH ENDOBRONCHICAL ULTRASOUND;  Surgeon: Tyler Pita, MD;  Location: [facility] ORS;  Service: Cardiopulmonary;  Laterality: N/A;    Social History   Socioeconomic History  . Marital status: Married    Spouse name: Not on file  . Number of children: Not on file  . Years of education: Not on file  . Highest education level: Not on file  Occupational History  . Not on file  Tobacco Use  . Smoking status: Former Smoker    Types: Cigarettes    Quit date: 2004    Years since quitting: 17.1  . Smokeless tobacco: Current User  . Tobacco comment: Quit 2004  Substance and Sexual Activity  . Alcohol use: No  . Drug use: No  . Sexual activity: Not Currently  Other Topics Concern  . Not on file  Social History Narrative  . Not on file   Social Determinants of Health   Financial  Resource Strain:   . Difficulty of Paying Living Expenses: Not on file  Food Insecurity:   . Worried About Charity fundraiser in the Last Year: Not on file  . Ran Out of Food in the Last Year: Not on file  Transportation Needs:   . Lack of Transportation (Medical): Not on file  . Lack of Transportation (Non-Medical): Not on file  Physical Activity:   . Days of Exercise per Week: Not on file  . Minutes of Exercise per Session: Not on file  Stress:   . Feeling of Stress : Not on file  Social Connections:   . Frequency of Communication with Friends and Family: Not on file  . Frequency of Social Gatherings with Friends and Family: Not on file  . Attends Religious Services: Not on file  . Active Member of Clubs or Organizations: Not on file  . Attends Archivist Meetings: Not on file  . Marital Status: Not on file  Intimate Partner Violence:   . Fear of Current or Ex-Partner: Not on file  . Emotionally Abused: Not on file  .  Physically Abused: Not on file  . Sexually Abused: Not on file    Family History  Problem Relation Age of Onset  . CAD Mother   . CAD Father        a. MI at 57-->death  . CAD Brother      Current Outpatient Medications:  .  acetaminophen (TYLENOL) 500 MG tablet, Take 1,000 mg by mouth every 6 (six) hours as needed for moderate pain. , Disp: , Rfl:  .  empagliflozin (JARDIANCE) 25 MG TABS tablet, Take 25 mg by mouth daily., Disp: , Rfl:  .  ezetimibe (ZETIA) 10 MG tablet, Take 1 tablet (10 mg total) by mouth daily., Disp: 30 tablet, Rfl: 3 .  gabapentin (NEURONTIN) 300 MG capsule, Take 300 mg by mouth at bedtime. , Disp: , Rfl:  .  glipiZIDE (GLUCOTROL) 10 MG tablet, Take 10 mg by mouth 2 (two) times daily before a meal., Disp: , Rfl:  .  Glycopyrrolate-Formoterol (BEVESPI AEROSPHERE) 9-4.8 MCG/ACT AERO, Inhale 2 puffs into the lungs 2 (two) times daily., Disp: 10.7 g, Rfl: 11 .  HYDROcodone-homatropine (HYCODAN) 5-1.5 MG/5ML syrup, Take 5 mLs by  mouth every 6 (six) hours as needed for cough., Disp: 120 mL, Rfl: 0 .  LORazepam (ATIVAN) 0.5 MG tablet, Take 1 tablet (0.5 mg total) by mouth 2 (two) times daily as needed for anxiety., Disp: 60 tablet, Rfl: 0 .  losartan (COZAAR) 50 MG tablet, Take 50 mg by mouth daily. , Disp: , Rfl:  .  metoprolol tartrate (LOPRESSOR) 25 MG tablet, Take 1 tablet (25 mg total) by mouth 2 (two) times daily. (Patient taking differently: Take 25 mg by mouth 1 day or 1 dose. ), Disp: 180 tablet, Rfl: 0 .  morphine (ROXANOL) 20 MG/ML concentrated solution, Take 1 mL (20 mg total) by mouth every 4 (four) hours as needed for severe pain., Disp: 60 mL, Rfl: 0 .  Omega-3 Fatty Acids (FISH OIL) 1200 MG CAPS, Take 2,400 mg by mouth at bedtime. , Disp: , Rfl:  .  omeprazole (PRILOSEC) 20 MG capsule, Take 1 capsule by mouth as needed., Disp: , Rfl:  .  rivaroxaban (XARELTO) 20 MG TABS tablet, Take 1 tablet (20 mg total) by mouth daily with supper., Disp: 90 tablet, Rfl: 2 .  sertraline (ZOLOFT) 25 MG tablet, Take 1 tablet (25 mg total) by mouth daily., Disp: 90 tablet, Rfl: 0 .  sucralfate (CARAFATE) 1 g tablet, Take 1 tablet (1 g total) by mouth 4 (four) times daily -  with meals and at bedtime., Disp: 120 tablet, Rfl: 6  Physical exam:  Vitals:   02/27/20 0947 02/27/20 0958  BP: (!) 123/100 (!) 123/100  Pulse: 85 69  Temp: (!) 96.9 F (36.1 C) (!) 96.9 F (36.1 C)  TempSrc: Tympanic Tympanic  SpO2: 98% 99%  Weight: 236 lb (107 kg) 236 lb (107 kg)   Physical Exam Constitutional:      General: He is not in acute distress. HENT:     Head: Normocephalic and atraumatic.  Eyes:     Pupils: Pupils are equal, round, and reactive to light.  Cardiovascular:     Rate and Rhythm: Normal rate and regular rhythm.     Heart sounds: Normal heart sounds.  Pulmonary:     Effort: Pulmonary effort is normal.     Breath sounds: Normal breath sounds.  Abdominal:     General: Bowel sounds are normal.     Palpations: Abdomen  is soft.  Musculoskeletal:  Cervical back: Normal range of motion.  Skin:    General: Skin is warm and dry.  Neurological:     Mental Status: He is alert and oriented to person, place, and time.      CMP Latest Ref Rng & Units 02/27/2020  Glucose 70 - 99 mg/dL 182(H)  BUN 8 - 23 mg/dL 15  Creatinine 0.61 - 1.24 mg/dL 0.66  Sodium 135 - 145 mmol/L 135  Potassium 3.5 - 5.1 mmol/L 4.0  Chloride 98 - 111 mmol/L 103  CO2 22 - 32 mmol/L 22  Calcium 8.9 - 10.3 mg/dL 9.2  Total Protein 6.5 - 8.1 g/dL 7.3  Total Bilirubin 0.3 - 1.2 mg/dL 0.7  Alkaline Phos 38 - 126 U/L 77  AST 15 - 41 U/L 16  ALT 0 - 44 U/L 13   CBC Latest Ref Rng & Units 02/27/2020  WBC 4.0 - 10.5 K/uL 5.8  Hemoglobin 13.0 - 17.0 g/dL 13.0  Hematocrit 39.0 - 52.0 % 39.4  Platelets 150 - 400 K/uL 243      Assessment and plan- Patient is a 69 y.o. 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  He is here for on treatment assessment prior to cycle 3 of maintenance durvalumab  Counts okay to proceed with cycle 3 of maintenance durvalumab today.  He will continue to get this every 2 weeks for 1 year.  I will see him back in 2 weeks for cycle 4.  Radiation esophagitis: Currently resolved and patient plans to come off liquid morphine and stopped taking his Carafate as well.  Anxiety: Continue as needed Ativan.  I have refilled his prescription and I have encouraged the patient to try to slowly come off the medication.   Visit Diagnosis 1. Encounter for antineoplastic immunotherapy   2. Malignant neoplasm of upper lobe of left lung ([place])   3. Anxiety      [name], MD, MPH [facility] [phone] 03/01/2020 8:31 AM

## 2020-03-02 ENCOUNTER — Telehealth: Payer: Self-pay | Admitting: *Deleted

## 2020-03-02 MED ORDER — LORAZEPAM 0.5 MG PO TABS: 0.5000 mg | ORAL_TABLET | Freq: Two times a day (BID) | ORAL | 0 refills | Status: DC | PRN

## 2020-03-02 NOTE — Telephone Encounter (Signed)
Called pt and let him know the lorazepam sent to [facility].

## 2020-03-12 ENCOUNTER — Inpatient Hospital Stay (HOSPITAL_BASED_OUTPATIENT_CLINIC_OR_DEPARTMENT_OTHER): Payer: Medicare Other | Admitting: Oncology

## 2020-03-12 ENCOUNTER — Inpatient Hospital Stay: Payer: Medicare Other

## 2020-03-12 ENCOUNTER — Other Ambulatory Visit: Payer: Self-pay

## 2020-03-12 ENCOUNTER — Encounter: Payer: Self-pay | Admitting: Oncology

## 2020-03-12 VITALS — BP 124/77 | HR 92 | Temp 98.1°F | Ht 69.0 in | Wt 238.5 lb

## 2020-03-12 DIAGNOSIS — C3412 Malignant neoplasm of upper lobe, left bronchus or lung: Secondary | ICD-10-CM

## 2020-03-12 DIAGNOSIS — Z5112 Encounter for antineoplastic immunotherapy: Secondary | ICD-10-CM

## 2020-03-12 DIAGNOSIS — F419 Anxiety disorder, unspecified: Secondary | ICD-10-CM

## 2020-03-12 DIAGNOSIS — Z95828 Presence of other vascular implants and grafts: Secondary | ICD-10-CM

## 2020-03-12 LAB — COMPREHENSIVE METABOLIC PANEL
ALT: 14 U/L (ref 0–44)
AST: 16 U/L (ref 15–41)
Albumin: 3.9 g/dL (ref 3.5–5.0)
Alkaline Phosphatase: 85 U/L (ref 38–126)
Anion gap: 11 (ref 5–15)
BUN: 15 mg/dL (ref 8–23)
CO2: 24 mmol/L (ref 22–32)
Calcium: 9.1 mg/dL (ref 8.9–10.3)
Chloride: 99 mmol/L (ref 98–111)
Creatinine, Ser: 0.63 mg/dL (ref 0.61–1.24)
GFR calc Af Amer: >60 mL/min (ref >60)
GFR calc non Af Amer: >60 mL/min (ref >60)
Glucose, Bld: 196 mg/dL — ABNORMAL HIGH (ref 70–99)
Potassium: 3.5 mmol/L (ref 3.5–5.1)
Sodium: 134 mmol/L — ABNORMAL LOW (ref 135–145)
Total Bilirubin: 0.6 mg/dL (ref 0.3–1.2)
Total Protein: 7.4 g/dL (ref 6.5–8.1)

## 2020-03-12 LAB — CBC WITH DIFFERENTIAL/PLATELET
Abs Immature Granulocytes: 0.04 10*3/uL (ref 0.00–0.07)
Basophils Absolute: 0 10*3/uL (ref 0.0–0.1)
Basophils Relative: 1 %
Eosinophils Absolute: 0.3 10*3/uL (ref 0.0–0.5)
Eosinophils Relative: 5 %
HCT: 39.9 % (ref 39.0–52.0)
Hemoglobin: 13.7 g/dL (ref 13.0–17.0)
Immature Granulocytes: 1 %
Lymphocytes Relative: 12 %
Lymphs Abs: 0.7 10*3/uL (ref 0.7–4.0)
MCH: 31.1 pg (ref 26.0–34.0)
MCHC: 34.3 g/dL (ref 30.0–36.0)
MCV: 90.5 fL (ref 80.0–100.0)
Monocytes Absolute: 0.9 10*3/uL (ref 0.1–1.0)
Monocytes Relative: 15 %
Neutro Abs: 4 10*3/uL (ref 1.7–7.7)
Neutrophils Relative %: 66 %
Platelets: 262 10*3/uL (ref 150–400)
RBC: 4.41 MIL/uL (ref 4.22–5.81)
RDW: 13.4 % (ref 11.5–15.5)
WBC: 6 10*3/uL (ref 4.0–10.5)
nRBC: 0 % (ref 0.0–0.2)

## 2020-03-12 MED ORDER — HEPARIN SOD (PORK) LOCK FLUSH 100 UNIT/ML IV SOLN
INTRAVENOUS | Status: AC
  Filled 2020-03-12: qty 5

## 2020-03-12 MED ORDER — HEPARIN SOD (PORK) LOCK FLUSH 100 UNIT/ML IV SOLN
500.0000 [IU] | Freq: Once | INTRAVENOUS | Status: AC | PRN
  Administered 2020-03-12: 500 [IU]
  Filled 2020-03-12: qty 5

## 2020-03-12 MED ORDER — DICYCLOMINE HCL 10 MG PO CAPS: 10.0000 mg | ORAL_CAPSULE | Freq: Three times a day (TID) | ORAL | 0 refills | Status: DC

## 2020-03-12 MED ORDER — SODIUM CHLORIDE 0.9 % IV SOLN
Freq: Once | INTRAVENOUS | Status: AC
  Filled 2020-03-12: qty 250

## 2020-03-12 MED ORDER — SODIUM CHLORIDE 0.9% FLUSH
10.0000 mL | Freq: Once | INTRAVENOUS | Status: AC
  Administered 2020-03-12: 10 mL via INTRAVENOUS
  Filled 2020-03-12: qty 10

## 2020-03-12 MED ORDER — SODIUM CHLORIDE 0.9 % IV SOLN
10.0000 mg/kg | Freq: Once | INTRAVENOUS | Status: AC
  Administered 2020-03-12: 1120 mg via INTRAVENOUS
  Filled 2020-03-12: qty 20

## 2020-03-12 NOTE — Progress Notes (Signed)
Patient stated that for the past two weeks he had not been able to feel good with headaches, nausea, dizziness, abdominal cramps, and no energy. Patient is not taking Zoloft or Morphine.

## 2020-03-15 NOTE — Progress Notes (Signed)
Hematology/Oncology Consult note [facility]  Telephone:([phone] [phone]  Patient Care Team: Center, [facility] as PCP - General ([place]) Telford Nab, RN as Registered Nurse   Name of the patient: Derrick Gallegos  [id number]  [birth year]   Date of visit: 03/15/20  Diagnosis- Stage IIIC SCC lung [id number]  Chief complaint/ Reason for visit-on treatment assessment prior to cycle 4 of maintenance durvalumab  Heme/Onc history: patient is a 69 year old malewho was a former smoker and smoked about 2 to 3 packs/day for many years and quit smoking a few years ago. He presented to the ER with symptoms of cough and shortness of breathWhich prompted [place] chest x-ray followed by a CT scan. CT scan showed a 3.4 x 3.2 cm left perihilar mass along the left paratracheal precarinal and subcarinal vascular and left hilar adenopathy. Patient has been referred for further management.  PET CT scan showed a large left upper lobe lung mass measuring 6.3 x 3.4 cm. Bilateral mediastinal adenopathy as well as left supraclavicular adenopathy. 3.4 cm partially exophytic lesion of the right kidney. No evidence of distant metastatic disease.  Patient has completed concurrent chemo/RT. Scans showe dpartial response. Maintenance durvalumab started in feb 2021  Patient has met with Dr. Erlene Quan for right renal mass which is being monitored for now and consideration for cryoablation in the future   Interval history-patient has chronic stress as well as ongoing depression as he has to take care of his wife who has multiple medical problems.  Reports having ongoing abdominal cramps which come and go and last for a few seconds.  Denies any fever, difficulty moving his bowels.  Denies any constipation or diarrhea.  Reports occasional headaches again which are mild and intermittent  ECOG PS- 1 Pain scale- 0 Opioid associated constipation-  no  Review of systems- Review of Systems  Constitutional: Positive for malaise/fatigue. Negative for chills, fever and weight loss.  HENT: Negative for congestion, ear discharge and nosebleeds.   Eyes: Negative for blurred vision.  Respiratory: Negative for cough, hemoptysis, sputum production, shortness of breath and wheezing.   Cardiovascular: Negative for chest pain, palpitations, orthopnea and claudication.  Gastrointestinal: Negative for abdominal pain, blood in stool, constipation, diarrhea, heartburn, melena, nausea and vomiting.       Abdominal cramps  Genitourinary: Negative for dysuria, flank pain, frequency, hematuria and urgency.  Musculoskeletal: Negative for back pain, joint pain and myalgias.  Skin: Negative for rash.  Neurological: Positive for headaches. Negative for dizziness, tingling, focal weakness, seizures and weakness.  Endo/Heme/Allergies: Does not bruise/bleed easily.  Psychiatric/Behavioral: Negative for depression and suicidal ideas. The patient does not have insomnia.       No Known Allergies   Past Medical History:  Diagnosis Date  . Anxiety   . Arthritis   . Atrial flutter ([place])    a. diagnosed 5/19; b. s/p TEE/DCCV 05/2018 by Dr. Humphrey Rolls; c. CHADS2VASc => 4 (HTN, age x1, DM, vascular disease)  . Complication of anesthesia    "wakes up too early"  . Coronary artery disease    a. s/p 3-v cabg in 2004 at [place] (LIMA and radial arery used - no further details in Care Everywhere); b. LHC 8/19 underlying multi-V dz, patent LIMA-LAD, occluded VG-PDA, 90-95% stenosis of LCx s/p PCI/DES x 2  . Cough   . Depression   . Diabetes mellitus with complication ([place])   . Dyspnea   . Dysrhythmia    atrial fib.  Marland Kitchen  Essential hypertension   . Family history of premature CAD    a. father passed away from an MI at 15  . GERD (gastroesophageal reflux disease)   . Heart murmur   . HLD (hyperlipidemia)    a. statin intolerant   . Lung cancer ([place]) 09/2019   Chemo + Rad  tx's.   . Mass of left lung   . MI (myocardial infarction) ([place])    2004  . Obesity      Past Surgical History:  Procedure Laterality Date  . CARDIAC CATHETERIZATION  2004  . CARDIAC CATHETERIZATION  2019   x2 stents [facility]  . CARDIAC SURGERY    . CARDIOVERSION N/A 06/20/2018   Procedure: CARDIOVERSION;  Surgeon: Dionisio David, MD;  Location: [facility] ORS;  Service: Cardiovascular;  Laterality: N/A;  . CORONARY ARTERY BYPASS GRAFT    . CORONARY STENT INTERVENTION N/A 08/08/2018   Procedure: CORONARY STENT INTERVENTION;  Surgeon: Isaias Cowman, MD;  Location: [place] CV LAB;  Service: Cardiovascular;  Laterality: N/A;  . LEFT HEART CATH AND CORONARY ANGIOGRAPHY Left 08/08/2018   Procedure: LEFT HEART CATH AND CORONARY ANGIOGRAPHY;  Surgeon: Dionisio David, MD;  Location: [place] CV LAB;  Service: Cardiovascular;  Laterality: Left;  . PORTA CATH INSERTION N/A 11/10/2019   Procedure: PORTA CATH INSERTION;  Surgeon: Algernon Huxley, MD;  Location: [place] CV LAB;  Service: Cardiovascular;  Laterality: N/A;  . TEE WITHOUT CARDIOVERSION N/A 06/20/2018   Procedure: TRANSESOPHAGEAL ECHOCARDIOGRAM (TEE);  Surgeon: Dionisio David, MD;  Location: [facility] ORS;  Service: Cardiovascular;  Laterality: N/A;  . VIDEO BRONCHOSCOPY N/A 10/29/2019   Procedure: VIDEO BRONCHOSCOPY WITH ENDOBRONCHICAL ULTRASOUND;  Surgeon: Tyler Pita, MD;  Location: [facility] ORS;  Service: Cardiopulmonary;  Laterality: N/A;    Social History   Socioeconomic History  . Marital status: Married    Spouse name: Not on file  . Number of children: Not on file  . Years of education: Not on file  . Highest education level: Not on file  Occupational History  . Not on file  Tobacco Use  . Smoking status: Former Smoker    Types: Cigarettes    Quit date: 2004    Years since quitting: 17.2  . Smokeless tobacco: Current User  . Tobacco comment: Quit 2004  Substance and Sexual Activity  . Alcohol use: No  .  Drug use: No  . Sexual activity: Not Currently  Other Topics Concern  . Not on file  Social History Narrative  . Not on file   Social Determinants of Health   Financial Resource Strain:   . Difficulty of Paying Living Expenses:   Food Insecurity:   . Worried About Charity fundraiser in the Last Year:   . Arboriculturist in the Last Year:   Transportation Needs:   . Film/video editor (Medical):   Marland Kitchen Lack of Transportation (Non-Medical):   Physical Activity:   . Days of Exercise per Week:   . Minutes of Exercise per Session:   Stress:   . Feeling of Stress :   Social Connections:   . Frequency of Communication with Friends and Family:   . Frequency of Social Gatherings with Friends and Family:   . Attends Religious Services:   . Active Member of Clubs or Organizations:   . Attends Archivist Meetings:   Marland Kitchen Marital Status:   Intimate Partner Violence:   . Fear of Current or Ex-Partner:   .  Emotionally Abused:   Marland Kitchen Physically Abused:   . Sexually Abused:     Family History  Problem Relation Age of Onset  . CAD Mother   . CAD Father        a. MI at 57-->death  . CAD Brother      Current Outpatient Medications:  .  acetaminophen (TYLENOL) 500 MG tablet, Take 1,000 mg by mouth every 6 (six) hours as needed for moderate pain. , Disp: , Rfl:  .  empagliflozin (JARDIANCE) 25 MG TABS tablet, Take 25 mg by mouth daily., Disp: , Rfl:  .  ezetimibe (ZETIA) 10 MG tablet, Take 1 tablet (10 mg total) by mouth daily., Disp: 30 tablet, Rfl: 3 .  gabapentin (NEURONTIN) 300 MG capsule, Take 300 mg by mouth at bedtime. , Disp: , Rfl:  .  glipiZIDE (GLUCOTROL) 10 MG tablet, Take 10 mg by mouth 2 (two) times daily before a meal., Disp: , Rfl:  .  Glycopyrrolate-Formoterol (BEVESPI AEROSPHERE) 9-4.8 MCG/ACT AERO, Inhale 2 puffs into the lungs 2 (two) times daily., Disp: 10.7 g, Rfl: 11 .  HYDROcodone-homatropine (HYCODAN) 5-1.5 MG/5ML syrup, Take 5 mLs by mouth every 6 (six)  hours as needed for cough., Disp: 120 mL, Rfl: 0 .  LORazepam (ATIVAN) 0.5 MG tablet, Take 1 tablet (0.5 mg total) by mouth 2 (two) times daily as needed for anxiety., Disp: 60 tablet, Rfl: 0 .  losartan (COZAAR) 50 MG tablet, Take 50 mg by mouth daily. , Disp: , Rfl:  .  metoprolol tartrate (LOPRESSOR) 25 MG tablet, Take 1 tablet (25 mg total) by mouth 2 (two) times daily. (Patient taking differently: Take 25 mg by mouth 1 day or 1 dose. ), Disp: 180 tablet, Rfl: 0 .  Omega-3 Fatty Acids (FISH OIL) 1200 MG CAPS, Take 2,400 mg by mouth at bedtime. , Disp: , Rfl:  .  omeprazole (PRILOSEC) 20 MG capsule, Take 1 capsule by mouth as needed., Disp: , Rfl:  .  rivaroxaban (XARELTO) 20 MG TABS tablet, Take 1 tablet (20 mg total) by mouth daily with supper., Disp: 90 tablet, Rfl: 2 .  sucralfate (CARAFATE) 1 g tablet, Take 1 tablet (1 g total) by mouth 4 (four) times daily -  with meals and at bedtime., Disp: 120 tablet, Rfl: 6 .  dicyclomine (BENTYL) 10 MG capsule, Take 1 capsule (10 mg total) by mouth 4 (four) times daily -  before meals and at bedtime., Disp: 45 capsule, Rfl: 0 .  morphine (ROXANOL) 20 MG/ML concentrated solution, Take 1 mL (20 mg total) by mouth every 4 (four) hours as needed for severe pain. (Patient not taking: Reported on 03/12/2020), Disp: 60 mL, Rfl: 0 .  sertraline (ZOLOFT) 25 MG tablet, Take 1 tablet (25 mg total) by mouth daily. (Patient not taking: Reported on 03/12/2020), Disp: 90 tablet, Rfl: 0  Physical exam:  Vitals:   03/12/20 0922  BP: 124/77  Pulse: 92  Temp: 98.1 F (36.7 C)  TempSrc: Tympanic  SpO2: 99%  Weight: 238 lb 8 oz (108.2 kg)  Height: 5' 9"  (1.753 m)   Physical Exam Constitutional:      General: He is not in acute distress. HENT:     Head: Normocephalic and atraumatic.  Eyes:     Pupils: Pupils are equal, round, and reactive to light.  Cardiovascular:     Rate and Rhythm: Normal rate and regular rhythm.     Heart sounds: Normal heart sounds.   Pulmonary:     Effort:  Pulmonary effort is normal.     Breath sounds: Normal breath sounds.  Abdominal:     General: Bowel sounds are normal. There is no distension.     Palpations: Abdomen is soft.     Tenderness: There is no abdominal tenderness.  Musculoskeletal:     Cervical back: Normal range of motion.  Skin:    General: Skin is warm and dry.  Neurological:     Mental Status: He is alert and oriented to person, place, and time.      CMP Latest Ref Rng & Units 03/12/2020  Glucose 70 - 99 mg/dL 196(H)  BUN 8 - 23 mg/dL 15  Creatinine 0.61 - 1.24 mg/dL 0.63  Sodium 135 - 145 mmol/L 134(L)  Potassium 3.5 - 5.1 mmol/L 3.5  Chloride 98 - 111 mmol/L 99  CO2 22 - 32 mmol/L 24  Calcium 8.9 - 10.3 mg/dL 9.1  Total Protein 6.5 - 8.1 g/dL 7.4  Total Bilirubin 0.3 - 1.2 mg/dL 0.6  Alkaline Phos 38 - 126 U/L 85  AST 15 - 41 U/L 16  ALT 0 - 44 U/L 14   CBC Latest Ref Rng & Units 03/12/2020  WBC 4.0 - 10.5 K/uL 6.0  Hemoglobin 13.0 - 17.0 g/dL 13.7  Hematocrit 39.0 - 52.0 % 39.9  Platelets 150 - 400 K/uL 262     Assessment and plan- Patient is a 69 y.o. 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  He is here for on treatment assessment prior to cycle 4 of maintenance durvalumab  Counts okay to proceed with cycle 4 of maintenance durvalumab today.  I will see him back in 2 weeks time for cycle 5.  Scans after cycle 6.  Abdominal cramps: Etiology unclear.  No change in bowel habits.  I will prescribe as needed Bentyl at this time.  If symptoms are severe we will consider getting scan sooner  Anxiety: Continue as needed Ativan   Visit Diagnosis 1. Encounter for antineoplastic immunotherapy   2. Malignant neoplasm of upper lobe of left lung ([place])   3. Anxiety      [name], MD, MPH [facility] [phone] 03/15/2020 2:00 PM

## 2020-03-18 NOTE — Progress Notes (Signed)
Pharmacist Chemotherapy Monitoring - Follow Up Assessment    I verify that I have reviewed each item in the below checklist:  . Regimen for the patient is scheduled for the appropriate day and plan matches scheduled date. Marland Kitchen Appropriate non-routine labs are ordered dependent on drug ordered. . If applicable, additional medications reviewed and ordered per protocol based on lifetime cumulative doses and/or treatment regimen.   Plan for follow-up and/or issues identified: No . I-vent associated with next due treatment: No . MD and/or nursing notified: No  Derrick Gallegos 03/18/2020 1:56 PM

## 2020-03-26 ENCOUNTER — Inpatient Hospital Stay (HOSPITAL_BASED_OUTPATIENT_CLINIC_OR_DEPARTMENT_OTHER): Payer: Medicare Other | Admitting: Oncology

## 2020-03-26 ENCOUNTER — Inpatient Hospital Stay: Payer: Medicare Other

## 2020-03-26 ENCOUNTER — Inpatient Hospital Stay: Payer: Medicare Other | Attending: Oncology

## 2020-03-26 ENCOUNTER — Encounter: Payer: Self-pay | Admitting: Oncology

## 2020-03-26 ENCOUNTER — Other Ambulatory Visit: Payer: Self-pay

## 2020-03-26 VITALS — Resp 20

## 2020-03-26 VITALS — BP 125/78 | HR 77 | Temp 96.4°F | Ht 69.0 in | Wt 239.0 lb

## 2020-03-26 DIAGNOSIS — K219 Gastro-esophageal reflux disease without esophagitis: Secondary | ICD-10-CM | POA: Insufficient documentation

## 2020-03-26 DIAGNOSIS — M791 Myalgia, unspecified site: Secondary | ICD-10-CM | POA: Insufficient documentation

## 2020-03-26 DIAGNOSIS — Z5112 Encounter for antineoplastic immunotherapy: Secondary | ICD-10-CM | POA: Insufficient documentation

## 2020-03-26 DIAGNOSIS — I2510 Atherosclerotic heart disease of native coronary artery without angina pectoris: Secondary | ICD-10-CM | POA: Insufficient documentation

## 2020-03-26 DIAGNOSIS — E069 Thyroiditis, unspecified: Secondary | ICD-10-CM | POA: Diagnosis not present

## 2020-03-26 DIAGNOSIS — F419 Anxiety disorder, unspecified: Secondary | ICD-10-CM | POA: Insufficient documentation

## 2020-03-26 DIAGNOSIS — Z7984 Long term (current) use of oral hypoglycemic drugs: Secondary | ICD-10-CM | POA: Insufficient documentation

## 2020-03-26 DIAGNOSIS — C3412 Malignant neoplasm of upper lobe, left bronchus or lung: Secondary | ICD-10-CM

## 2020-03-26 DIAGNOSIS — I252 Old myocardial infarction: Secondary | ICD-10-CM | POA: Insufficient documentation

## 2020-03-26 DIAGNOSIS — R5381 Other malaise: Secondary | ICD-10-CM | POA: Diagnosis not present

## 2020-03-26 DIAGNOSIS — Z79899 Other long term (current) drug therapy: Secondary | ICD-10-CM | POA: Insufficient documentation

## 2020-03-26 DIAGNOSIS — I10 Essential (primary) hypertension: Secondary | ICD-10-CM | POA: Diagnosis not present

## 2020-03-26 DIAGNOSIS — R5382 Chronic fatigue, unspecified: Secondary | ICD-10-CM | POA: Insufficient documentation

## 2020-03-26 DIAGNOSIS — Z7901 Long term (current) use of anticoagulants: Secondary | ICD-10-CM | POA: Diagnosis not present

## 2020-03-26 DIAGNOSIS — M1990 Unspecified osteoarthritis, unspecified site: Secondary | ICD-10-CM | POA: Diagnosis not present

## 2020-03-26 DIAGNOSIS — E1159 Type 2 diabetes mellitus with other circulatory complications: Secondary | ICD-10-CM | POA: Insufficient documentation

## 2020-03-26 DIAGNOSIS — Z95828 Presence of other vascular implants and grafts: Secondary | ICD-10-CM

## 2020-03-26 DIAGNOSIS — Z87891 Personal history of nicotine dependence: Secondary | ICD-10-CM | POA: Diagnosis not present

## 2020-03-26 DIAGNOSIS — F32A Depression, unspecified: Secondary | ICD-10-CM

## 2020-03-26 DIAGNOSIS — F329 Major depressive disorder, single episode, unspecified: Secondary | ICD-10-CM

## 2020-03-26 DIAGNOSIS — E785 Hyperlipidemia, unspecified: Secondary | ICD-10-CM | POA: Diagnosis not present

## 2020-03-26 DIAGNOSIS — R197 Diarrhea, unspecified: Secondary | ICD-10-CM | POA: Diagnosis not present

## 2020-03-26 LAB — CBC WITH DIFFERENTIAL/PLATELET
Abs Immature Granulocytes: 0.02 10*3/uL (ref 0.00–0.07)
Basophils Absolute: 0 10*3/uL (ref 0.0–0.1)
Basophils Relative: 1 %
Eosinophils Absolute: 0.2 10*3/uL (ref 0.0–0.5)
Eosinophils Relative: 4 %
HCT: 40.1 % (ref 39.0–52.0)
Hemoglobin: 13.5 g/dL (ref 13.0–17.0)
Immature Granulocytes: 0 %
Lymphocytes Relative: 14 %
Lymphs Abs: 0.8 10*3/uL (ref 0.7–4.0)
MCH: 30.1 pg (ref 26.0–34.0)
MCHC: 33.7 g/dL (ref 30.0–36.0)
MCV: 89.3 fL (ref 80.0–100.0)
Monocytes Absolute: 0.8 10*3/uL (ref 0.1–1.0)
Monocytes Relative: 15 %
Neutro Abs: 3.7 10*3/uL (ref 1.7–7.7)
Neutrophils Relative %: 66 %
Platelets: 278 10*3/uL (ref 150–400)
RBC: 4.49 MIL/uL (ref 4.22–5.81)
RDW: 13.3 % (ref 11.5–15.5)
WBC: 5.6 10*3/uL (ref 4.0–10.5)
nRBC: 0 % (ref 0.0–0.2)

## 2020-03-26 LAB — COMPREHENSIVE METABOLIC PANEL
ALT: 13 U/L (ref 0–44)
AST: 15 U/L (ref 15–41)
Albumin: 4 g/dL (ref 3.5–5.0)
Alkaline Phosphatase: 81 U/L (ref 38–126)
Anion gap: 9 (ref 5–15)
BUN: 17 mg/dL (ref 8–23)
CO2: 24 mmol/L (ref 22–32)
Calcium: 9.4 mg/dL (ref 8.9–10.3)
Chloride: 103 mmol/L (ref 98–111)
Creatinine, Ser: 0.65 mg/dL (ref 0.61–1.24)
GFR calc Af Amer: >60 mL/min (ref >60)
GFR calc non Af Amer: >60 mL/min (ref >60)
Glucose, Bld: 161 mg/dL — ABNORMAL HIGH (ref 70–99)
Potassium: 4 mmol/L (ref 3.5–5.1)
Sodium: 136 mmol/L (ref 135–145)
Total Bilirubin: 0.5 mg/dL (ref 0.3–1.2)
Total Protein: 7.7 g/dL (ref 6.5–8.1)

## 2020-03-26 MED ORDER — HEPARIN SOD (PORK) LOCK FLUSH 100 UNIT/ML IV SOLN
INTRAVENOUS | Status: AC
  Filled 2020-03-26: qty 5

## 2020-03-26 MED ORDER — SODIUM CHLORIDE 0.9 % IV SOLN
Freq: Once | INTRAVENOUS | Status: AC
  Filled 2020-03-26: qty 250

## 2020-03-26 MED ORDER — SODIUM CHLORIDE 0.9% FLUSH
10.0000 mL | Freq: Once | INTRAVENOUS | Status: AC
  Administered 2020-03-26: 08:00:00 10 mL via INTRAVENOUS
  Filled 2020-03-26: qty 10

## 2020-03-26 MED ORDER — GABAPENTIN 300 MG PO CAPS: 300.0000 mg | ORAL_CAPSULE | Freq: Three times a day (TID) | ORAL | 0 refills | Status: DC

## 2020-03-26 MED ORDER — HEPARIN SOD (PORK) LOCK FLUSH 100 UNIT/ML IV SOLN
500.0000 [IU] | Freq: Once | INTRAVENOUS | Status: AC | PRN
  Administered 2020-03-26: 11:00:00 500 [IU]
  Filled 2020-03-26: qty 5

## 2020-03-26 MED ORDER — SODIUM CHLORIDE 0.9 % IV SOLN
10.0000 mg/kg | Freq: Once | INTRAVENOUS | Status: AC
  Administered 2020-03-26: 1120 mg via INTRAVENOUS
  Filled 2020-03-26: qty 20

## 2020-03-26 NOTE — Progress Notes (Signed)
Patient stated that he stays nauseated but no vomiting, therefore, it is hard for him to have an appetite. Patient also stated that he stays with joint and muscle pain through all his body. Patient stated that he stays SOB and weak.

## 2020-03-29 NOTE — Progress Notes (Signed)
Hematology/Oncology Consult note [facility]  [phone] [phone]  Patient Care Team: Center, [facility] as PCP - General ([place]) Telford Nab, RN as Registered Nurse   Name of the patient: Derrick Gallegos  [id number]  18-Nov-1951   Date of visit: 03/29/20  Diagnosis- Stage IIIC SCC lung [id number]  Chief complaint/ Reason for visit-on treatment assessment prior to cycle 5 of maintenance durvalumab  Heme/Onc history: patient is a 69 year old malewho was a former smoker and smoked about 2 to 3 packs/day for many years and quit smoking a few years ago. He presented to the ER with symptoms of cough and shortness of breathWhich prompted [place] chest x-ray followed by a CT scan. CT scan showed a 3.4 x 3.2 cm left perihilar mass along the left paratracheal precarinal and subcarinal vascular and left hilar adenopathy. Patient has been referred for further management.  PET CT scan showed a large left upper lobe lung mass measuring 6.3 x 3.4 cm. Bilateral mediastinal adenopathy as well as left supraclavicular adenopathy. 3.4 cm partially exophytic lesion of the right kidney. No evidence of distant metastatic disease.  Patient has completed concurrent chemo/RT. Scans showe dpartial response. Maintenance durvalumab started in feb 2021  Patient has met with Dr. Erlene Quan for right renal mass which is being monitored for now and consideration for cryoablation in the future   Interval history-reports multiple complaints today.  Sleep has been an issue since a long time.  Appetite is fair.  There are times when he feels depressed but he does not want to try any antidepressant medications.  Feels like his whole body hurts..  Reports feeling itchy overall but has no distinct skin rash.  Denies any suicidal or homicidal ideations.  Reports occasional nausea   ECOG PS- 1 Pain scale- 6 Opioid associated constipation-  no  Review of systems- Review of Systems  Constitutional: Positive for malaise/fatigue. Negative for chills, fever and weight loss.       Whole body pain  HENT: Negative for congestion, ear discharge and nosebleeds.   Eyes: Negative for blurred vision.  Respiratory: Negative for cough, hemoptysis, sputum production, shortness of breath and wheezing.   Cardiovascular: Negative for chest pain, palpitations, orthopnea and claudication.  Gastrointestinal: Positive for nausea. Negative for abdominal pain, blood in stool, constipation, diarrhea, heartburn, melena and vomiting.  Genitourinary: Negative for dysuria, flank pain, frequency, hematuria and urgency.  Musculoskeletal: Negative for back pain, joint pain and myalgias.  Skin: Negative for rash.  Neurological: Negative for dizziness, tingling, focal weakness, seizures, weakness and headaches.  Endo/Heme/Allergies: Does not bruise/bleed easily.  Psychiatric/Behavioral: Positive for depression. Negative for suicidal ideas. The patient does not have insomnia.        No Known Allergies   Past Medical History:  Diagnosis Date  . Anxiety   . Arthritis   . Atrial flutter ([place])    a. diagnosed 5/19; b. s/p TEE/DCCV 05/2018 by Dr. Humphrey Rolls; c. CHADS2VASc => 4 (HTN, age x1, DM, vascular disease)  . Complication of anesthesia    "wakes up too early"  . Coronary artery disease    a. s/p 3-v cabg in 2004 at [place] (LIMA and radial arery used - no further details in Care Everywhere); b. LHC 8/19 underlying multi-V dz, patent LIMA-LAD, occluded VG-PDA, 90-95% stenosis of LCx s/p PCI/DES x 2  . Cough   . Depression   . Diabetes mellitus with complication ([place])   . Dyspnea   . Dysrhythmia  atrial fib.  . Essential hypertension   . Family history of premature CAD    a. father passed away from an MI at 69  . GERD (gastroesophageal reflux disease)   . Heart murmur   . HLD (hyperlipidemia)    a. statin intolerant   . Lung cancer ([place]) 09/2019    Chemo + Rad tx's.   . Mass of left lung   . MI (myocardial infarction) ([place])    2004  . Obesity      Past Surgical History:  Procedure Laterality Date  . CARDIAC CATHETERIZATION  2004  . CARDIAC CATHETERIZATION  2019   x2 stents [facility]  . CARDIAC SURGERY    . CARDIOVERSION N/A 06/20/2018   Procedure: CARDIOVERSION;  Surgeon: Dionisio David, MD;  Location: [facility] ORS;  Service: Cardiovascular;  Laterality: N/A;  . CORONARY ARTERY BYPASS GRAFT    . CORONARY STENT INTERVENTION N/A 08/08/2018   Procedure: CORONARY STENT INTERVENTION;  Surgeon: Isaias Cowman, MD;  Location: [place] [facility];  Service: Cardiovascular;  Laterality: N/A;  . LEFT HEART CATH AND CORONARY ANGIOGRAPHY Left 08/08/2018   Procedure: LEFT HEART CATH AND CORONARY ANGIOGRAPHY;  Surgeon: Dionisio David, MD;  Location: [place] [facility];  Service: Cardiovascular;  Laterality: Left;  . PORTA CATH INSERTION N/A 11/10/2019   Procedure: PORTA CATH INSERTION;  Surgeon: Algernon Huxley, MD;  Location: [place] [facility];  Service: Cardiovascular;  Laterality: N/A;  . TEE WITHOUT CARDIOVERSION N/A 06/20/2018   Procedure: TRANSESOPHAGEAL ECHOCARDIOGRAM (TEE);  Surgeon: Dionisio David, MD;  Location: [facility] ORS;  Service: Cardiovascular;  Laterality: N/A;  . VIDEO BRONCHOSCOPY N/A 10/29/2019   Procedure: VIDEO BRONCHOSCOPY WITH ENDOBRONCHICAL ULTRASOUND;  Surgeon: Tyler Pita, MD;  Location: [facility] ORS;  Service: Cardiopulmonary;  Laterality: N/A;    Social History   Socioeconomic History  . Marital status: Married    Spouse name: Not on file  . Number of children: Not on file  . Years of education: Not on file  . Highest education level: Not on file  Occupational History  . Not on file  Tobacco Use  . Smoking status: Former Smoker    Types: Cigarettes    Quit date: 2004    Years since quitting: 17.2  . Smokeless tobacco: Current User  . Tobacco comment: Quit 2004  Substance and Sexual Activity  . Alcohol  use: No  . Drug use: No  . Sexual activity: Not Currently  Other Topics Concern  . Not on file  Social History Narrative  . Not on file   Social Determinants of Health   Financial Resource Strain:   . Difficulty of Paying Living Expenses:   Food Insecurity:   . Worried About Charity fundraiser in the Last Year:   . Arboriculturist in the Last Year:   Transportation Needs:   . Film/video editor (Medical):   Marland Kitchen Lack of Transportation (Non-Medical):   Physical Activity:   . Days of Exercise per Week:   . Minutes of Exercise per Session:   Stress:   . Feeling of Stress :   Social Connections:   . Frequency of Communication with Friends and Family:   . Frequency of Social Gatherings with Friends and Family:   . Attends Religious Services:   . Active Member of Clubs or Organizations:   . Attends Archivist Meetings:   Marland Kitchen Marital Status:   Intimate Partner Violence:   . Fear of Current  or Ex-Partner:   . Emotionally Abused:   Marland Kitchen Physically Abused:   . Sexually Abused:     Family History  Problem Relation Age of Onset  . CAD Mother   . CAD Father        a. MI at 57-->death  . CAD Brother      Current Outpatient Medications:  .  acetaminophen (TYLENOL) 500 MG tablet, Take 1,000 mg by mouth every 6 (six) hours as needed for moderate pain. , Disp: , Rfl:  .  dicyclomine (BENTYL) 10 MG capsule, Take 1 capsule (10 mg total) by mouth 4 (four) times daily -  before meals and at bedtime., Disp: 45 capsule, Rfl: 0 .  empagliflozin (JARDIANCE) 25 MG TABS tablet, Take 25 mg by mouth daily., Disp: , Rfl:  .  ezetimibe (ZETIA) 10 MG tablet, Take 1 tablet (10 mg total) by mouth daily., Disp: 30 tablet, Rfl: 3 .  gabapentin (NEURONTIN) 300 MG capsule, Take 1 capsule (300 mg total) by mouth 3 (three) times daily., Disp: 90 capsule, Rfl: 0 .  glipiZIDE (GLUCOTROL) 10 MG tablet, Take 10 mg by mouth 2 (two) times daily before a meal., Disp: , Rfl:  .  Glycopyrrolate-Formoterol  (BEVESPI AEROSPHERE) 9-4.8 MCG/ACT AERO, Inhale 2 puffs into the lungs 2 (two) times daily., Disp: 10.7 g, Rfl: 11 .  HYDROcodone-homatropine (HYCODAN) 5-1.5 MG/5ML syrup, Take 5 mLs by mouth every 6 (six) hours as needed for cough., Disp: 120 mL, Rfl: 0 .  LORazepam (ATIVAN) 0.5 MG tablet, Take 1 tablet (0.5 mg total) by mouth 2 (two) times daily as needed for anxiety., Disp: 60 tablet, Rfl: 0 .  losartan (COZAAR) 50 MG tablet, Take 50 mg by mouth daily. , Disp: , Rfl:  .  metoprolol tartrate (LOPRESSOR) 25 MG tablet, Take 1 tablet (25 mg total) by mouth 2 (two) times daily. (Patient taking differently: Take 25 mg by mouth 1 day or 1 dose. ), Disp: 180 tablet, Rfl: 0 .  morphine (ROXANOL) 20 MG/ML concentrated solution, Take 1 mL (20 mg total) by mouth every 4 (four) hours as needed for severe pain., Disp: 60 mL, Rfl: 0 .  Omega-3 Fatty Acids (FISH OIL) 1200 MG CAPS, Take 2,400 mg by mouth at bedtime. , Disp: , Rfl:  .  omeprazole (PRILOSEC) 20 MG capsule, Take 1 capsule by mouth as needed., Disp: , Rfl:  .  rivaroxaban (XARELTO) 20 MG TABS tablet, Take 1 tablet (20 mg total) by mouth daily with supper., Disp: 90 tablet, Rfl: 2 .  sertraline (ZOLOFT) 25 MG tablet, Take 1 tablet (25 mg total) by mouth daily., Disp: 90 tablet, Rfl: 0 .  sucralfate (CARAFATE) 1 g tablet, Take 1 tablet (1 g total) by mouth 4 (four) times daily -  with meals and at bedtime., Disp: 120 tablet, Rfl: 6  Physical exam:  Vitals:   03/26/20 0835  BP: 125/78  Pulse: 77  Temp: (!) 96.4 F (35.8 C)  TempSrc: Tympanic  SpO2: 100%  Weight: 239 lb (108.4 kg)  Height: 5' 9"  (1.753 m)   Physical Exam Constitutional:      General: He is not in acute distress. HENT:     Head: Normocephalic and atraumatic.  Cardiovascular:     Rate and Rhythm: Normal rate and regular rhythm.     Heart sounds: Normal heart sounds.  Pulmonary:     Effort: Pulmonary effort is normal.     Breath sounds: Normal breath sounds.  Abdominal:  General: Bowel sounds are normal.     Palpations: Abdomen is soft.  Skin:    General: Skin is warm and dry.  Neurological:     Mental Status: He is alert and oriented to person, place, and time.      CMP Latest Ref Rng & Units 03/26/2020  Glucose 70 - 99 mg/dL 161(H)  BUN 8 - 23 mg/dL 17  Creatinine 0.61 - 1.24 mg/dL 0.65  Sodium 135 - 145 mmol/L 136  Potassium 3.5 - 5.1 mmol/L 4.0  Chloride 98 - 111 mmol/L 103  CO2 22 - 32 mmol/L 24  Calcium 8.9 - 10.3 mg/dL 9.4  Total Protein 6.5 - 8.1 g/dL 7.7  Total Bilirubin 0.3 - 1.2 mg/dL 0.5  Alkaline Phos 38 - 126 U/L 81  AST 15 - 41 U/L 15  ALT 0 - 44 U/L 13   CBC Latest Ref Rng & Units 03/26/2020  WBC 4.0 - 10.5 K/uL 5.6  Hemoglobin 13.0 - 17.0 g/dL 13.5  Hematocrit 39.0 - 52.0 % 40.1  Platelets 150 - 400 K/uL 278      Assessment and plan- Patient is a 69 y.o. 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   Is here for on treatment assessment prior to cycle 5 of maintenance durvalumab  Counts okay to proceed with cycle 5 of maintenance durvalumab today.  He will return to clinic in 2 weeks for cycle 6 and I will plan to get scans done after that  Abdominal cramps: That has currently resolved  Depression: I suspect the patient's physical symptoms including whole body pain and lack of appetite is secondary to underlying depression.  He has to care for his wife and that has been one of the biggest problems for him.  We have discussed trying antidepressant medications multiple times in the past but he was hesitant to use it.  He is willing to try Zoloft at this time  Whole body pain: Again suspect this is secondary to depression.  He does have as needed morphine which she will continue to take.  Will assess further with repeat scans as above  Fatigue: We will check ACTH, TSH and cortisol with next set of labs.  I have also recommended patient can try over-the-counter American ginseng or [place] ginseng as it has been  shown to help cancer associated fatigue and randomized control trials   Visit Diagnosis 1. Malignant neoplasm of upper lobe of left lung ([place])   2. Encounter for antineoplastic immunotherapy   3. Depression, unspecified depression type   4. Chronic fatigue      [name], MD, MPH [facility] at [facility] [phone] 03/29/2020 12:41 PM

## 2020-03-30 ENCOUNTER — Encounter: Payer: Self-pay | Admitting: Radiation Oncology

## 2020-04-02 NOTE — Progress Notes (Signed)
Pharmacist Chemotherapy Monitoring - Follow Up Assessment    I verify that I have reviewed each item in the below checklist:  . Regimen for the patient is scheduled for the appropriate day and plan matches scheduled date. Marland Kitchen Appropriate non-routine labs are ordered dependent on drug ordered. . If applicable, additional medications reviewed and ordered per protocol based on lifetime cumulative doses and/or treatment regimen.   Plan for follow-up and/or issues identified: No . I-vent associated with next due treatment: No . MD and/or nursing notified: No  Derrick Gallegos K 04/02/2020 8:44 AM

## 2020-04-05 ENCOUNTER — Other Ambulatory Visit: Payer: Self-pay | Admitting: Oncology

## 2020-04-05 DIAGNOSIS — C3412 Malignant neoplasm of upper lobe, left bronchus or lung: Secondary | ICD-10-CM

## 2020-04-05 MED ORDER — LORAZEPAM 0.5 MG PO TABS: 0.5000 mg | ORAL_TABLET | Freq: Two times a day (BID) | ORAL | 0 refills | Status: DC | PRN

## 2020-04-09 ENCOUNTER — Inpatient Hospital Stay: Payer: Medicare Other

## 2020-04-09 ENCOUNTER — Other Ambulatory Visit: Payer: Self-pay | Admitting: *Deleted

## 2020-04-09 ENCOUNTER — Other Ambulatory Visit: Payer: Self-pay

## 2020-04-09 ENCOUNTER — Inpatient Hospital Stay (HOSPITAL_BASED_OUTPATIENT_CLINIC_OR_DEPARTMENT_OTHER): Payer: Medicare Other | Admitting: Oncology

## 2020-04-09 ENCOUNTER — Encounter: Payer: Self-pay | Admitting: Oncology

## 2020-04-09 VITALS — BP 122/74 | HR 71 | Temp 94.1°F | Resp 16 | Wt 238.9 lb

## 2020-04-09 DIAGNOSIS — C3412 Malignant neoplasm of upper lobe, left bronchus or lung: Secondary | ICD-10-CM

## 2020-04-09 DIAGNOSIS — M791 Myalgia, unspecified site: Secondary | ICD-10-CM

## 2020-04-09 DIAGNOSIS — Z5112 Encounter for antineoplastic immunotherapy: Secondary | ICD-10-CM | POA: Diagnosis not present

## 2020-04-09 DIAGNOSIS — E069 Thyroiditis, unspecified: Secondary | ICD-10-CM

## 2020-04-09 LAB — COMPREHENSIVE METABOLIC PANEL
ALT: 13 U/L (ref 0–44)
AST: 14 U/L — ABNORMAL LOW (ref 15–41)
Albumin: 4 g/dL (ref 3.5–5.0)
Alkaline Phosphatase: 67 U/L (ref 38–126)
Anion gap: 9 (ref 5–15)
BUN: 21 mg/dL (ref 8–23)
CO2: 25 mmol/L (ref 22–32)
Calcium: 9.3 mg/dL (ref 8.9–10.3)
Chloride: 103 mmol/L (ref 98–111)
Creatinine, Ser: 0.65 mg/dL (ref 0.61–1.24)
GFR calc Af Amer: >60 mL/min (ref >60)
GFR calc non Af Amer: >60 mL/min (ref >60)
Glucose, Bld: 147 mg/dL — ABNORMAL HIGH (ref 70–99)
Potassium: 3.9 mmol/L (ref 3.5–5.1)
Sodium: 137 mmol/L (ref 135–145)
Total Bilirubin: 0.6 mg/dL (ref 0.3–1.2)
Total Protein: 7.3 g/dL (ref 6.5–8.1)

## 2020-04-09 LAB — CBC WITH DIFFERENTIAL/PLATELET
Abs Immature Granulocytes: 0.01 10*3/uL (ref 0.00–0.07)
Basophils Absolute: 0 10*3/uL (ref 0.0–0.1)
Basophils Relative: 0 %
Eosinophils Absolute: 0.2 10*3/uL (ref 0.0–0.5)
Eosinophils Relative: 4 %
HCT: 41.4 % (ref 39.0–52.0)
Hemoglobin: 13.8 g/dL (ref 13.0–17.0)
Immature Granulocytes: 0 %
Lymphocytes Relative: 16 %
Lymphs Abs: 0.7 10*3/uL (ref 0.7–4.0)
MCH: 29.6 pg (ref 26.0–34.0)
MCHC: 33.3 g/dL (ref 30.0–36.0)
MCV: 88.7 fL (ref 80.0–100.0)
Monocytes Absolute: 0.7 10*3/uL (ref 0.1–1.0)
Monocytes Relative: 15 %
Neutro Abs: 3.1 10*3/uL (ref 1.7–7.7)
Neutrophils Relative %: 65 %
Platelets: 247 10*3/uL (ref 150–400)
RBC: 4.67 MIL/uL (ref 4.22–5.81)
RDW: 13.2 % (ref 11.5–15.5)
WBC: 4.8 10*3/uL (ref 4.0–10.5)
nRBC: 0 % (ref 0.0–0.2)

## 2020-04-09 LAB — CK: Total CK: 38 U/L — ABNORMAL LOW (ref 49–397)

## 2020-04-09 LAB — T4, FREE: Free T4: 2.44 ng/dL — ABNORMAL HIGH (ref 0.61–1.12)

## 2020-04-09 LAB — TSH: TSH: <0.01 u[IU]/mL — ABNORMAL LOW (ref 0.350–4.500)

## 2020-04-09 LAB — CORTISOL: Cortisol, Plasma: 13.9 ug/dL

## 2020-04-09 MED ORDER — HEPARIN SOD (PORK) LOCK FLUSH 100 UNIT/ML IV SOLN
500.0000 [IU] | Freq: Once | INTRAVENOUS | Status: AC
  Administered 2020-04-09: 500 [IU] via INTRAVENOUS
  Filled 2020-04-09: qty 5

## 2020-04-09 NOTE — Progress Notes (Signed)
Pt having muscle aches all over for last 3 weeks. He went to get on lawn mower and he went on and off it falling to the other side. That is the only fall he had. He eats good and swallows good. He does stay nauseated but can eat. His sugars in am when he checks it is 200 to 220

## 2020-04-10 LAB — MISC LABCORP TEST (SEND OUT): Labcorp test code: 2030

## 2020-04-10 LAB — T3, FREE: T3, Free: 9.1 pg/mL — ABNORMAL HIGH (ref 2.0–4.4)

## 2020-04-10 LAB — ACTH: C206 ACTH: 56.9 pg/mL (ref 7.2–63.3)

## 2020-04-12 ENCOUNTER — Other Ambulatory Visit: Payer: Self-pay | Admitting: *Deleted

## 2020-04-12 DIAGNOSIS — E069 Thyroiditis, unspecified: Secondary | ICD-10-CM

## 2020-04-12 DIAGNOSIS — C3412 Malignant neoplasm of upper lobe, left bronchus or lung: Secondary | ICD-10-CM

## 2020-04-12 DIAGNOSIS — R5382 Chronic fatigue, unspecified: Secondary | ICD-10-CM

## 2020-04-12 DIAGNOSIS — M791 Myalgia, unspecified site: Secondary | ICD-10-CM

## 2020-04-12 NOTE — Progress Notes (Signed)
Hematology/Oncology Consult note [facility]  [phone] [phone]  Patient Care Team: Center, [facility] as PCP - General ([place]) Telford Nab, RN as Registered Nurse   Name of the patient: Derrick Gallegos  [id number]  1951/04/06   Date of visit: 04/12/20  Diagnosis- Stage IIIC SCC lung [id number]  Chief complaint/ Reason for visit-on treatment assessment prior to cycle 6 of maintenance durvalumab  Heme/Onc history: patient is a 70 year old malewho was a former smoker and smoked about 2 to 3 packs/day for many years and quit smoking a few years ago. He presented to the ER with symptoms of cough and shortness of breathWhich prompted [place] chest x-ray followed by a CT scan. CT scan showed a 3.4 x 3.2 cm left perihilar mass along the left paratracheal precarinal and subcarinal vascular and left hilar adenopathy. Patient has been referred for further management.  PET CT scan showed a large left upper lobe lung mass measuring 6.3 x 3.4 cm. Bilateral mediastinal adenopathy as well as left supraclavicular adenopathy. 3.4 cm partially exophytic lesion of the right kidney. No evidence of distant metastatic disease.  Patient has completed concurrent chemo/RT. Scans showe dpartial response. Maintenance durvalumab started in feb 2021  Patient has met with Dr. Erlene Quan for right renal mass which is being monitored for now and consideration for cryoablation in the future   Interval history-patient reports feeling fatigued overall.  Feels that his whole body aches and his muscles are sore.  Appetite is poor.  Has chronic anxiety issues.  He has not taken Zoloft yet.  Reports vague abdominal pain and cramping.  Occasional diarrhea  ECOG PS- 1 Pain scale- 5 Opioid associated constipation- no  Review of systems- Review of Systems  Constitutional: Positive for malaise/fatigue. Negative for chills, fever and weight loss.   HENT: Negative for congestion, ear discharge and nosebleeds.   Eyes: Negative for blurred vision.  Respiratory: Negative for cough, hemoptysis, sputum production, shortness of breath and wheezing.   Cardiovascular: Negative for chest pain, palpitations, orthopnea and claudication.  Gastrointestinal: Positive for abdominal pain. Negative for blood in stool, constipation, diarrhea, heartburn, melena, nausea and vomiting.  Genitourinary: Negative for dysuria, flank pain, frequency, hematuria and urgency.  Musculoskeletal: Negative for back pain, joint pain and myalgias.  Skin: Negative for rash.  Neurological: Negative for dizziness, tingling, focal weakness, seizures, weakness and headaches.  Endo/Heme/Allergies: Does not bruise/bleed easily.  Psychiatric/Behavioral: Negative for depression and suicidal ideas. The patient does not have insomnia.       No Known Allergies   Past Medical History:  Diagnosis Date  . Anxiety   . Arthritis   . Atrial flutter ([place])    a. diagnosed 5/19; b. s/p TEE/DCCV 05/2018 by Dr. Humphrey Rolls; c. CHADS2VASc => 4 (HTN, age x1, DM, vascular disease)  . Complication of anesthesia    "wakes up too early"  . Coronary artery disease    a. s/p 3-v cabg in 2004 at [place] (LIMA and radial arery used - no further details in Care Everywhere); b. LHC 8/19 underlying multi-V dz, patent LIMA-LAD, occluded VG-PDA, 90-95% stenosis of LCx s/p PCI/DES x 2  . Cough   . Depression   . Diabetes mellitus with complication ([place])   . Dyspnea   . Dysrhythmia    atrial fib.  . Essential hypertension   . Family history of premature CAD    a. father passed away from an MI at 42  . GERD (gastroesophageal reflux disease)   .  Heart murmur   . HLD (hyperlipidemia)    a. statin intolerant   . Lung cancer ([place]) 09/2019   Chemo + Rad tx's.   . Mass of left lung   . MI (myocardial infarction) ([place])    2004  . Obesity      Past Surgical History:  Procedure Laterality Date  . CARDIAC  CATHETERIZATION  2004  . CARDIAC CATHETERIZATION  2019   x2 stents [facility]  . CARDIAC SURGERY    . CARDIOVERSION N/A 06/20/2018   Procedure: CARDIOVERSION;  Surgeon: Dionisio David, MD;  Location: [facility] ORS;  Service: Cardiovascular;  Laterality: N/A;  . CORONARY ARTERY BYPASS GRAFT    . CORONARY STENT INTERVENTION N/A 08/08/2018   Procedure: CORONARY STENT INTERVENTION;  Surgeon: Isaias Cowman, MD;  Location: [place] CV LAB;  Service: Cardiovascular;  Laterality: N/A;  . LEFT HEART CATH AND CORONARY ANGIOGRAPHY Left 08/08/2018   Procedure: LEFT HEART CATH AND CORONARY ANGIOGRAPHY;  Surgeon: Dionisio David, MD;  Location: [place] [facility];  Service: Cardiovascular;  Laterality: Left;  . PORTA CATH INSERTION N/A 11/10/2019   Procedure: PORTA CATH INSERTION;  Surgeon: Algernon Huxley, MD;  Location: [place] [facility];  Service: Cardiovascular;  Laterality: N/A;  . TEE WITHOUT CARDIOVERSION N/A 06/20/2018   Procedure: TRANSESOPHAGEAL ECHOCARDIOGRAM (TEE);  Surgeon: Dionisio David, MD;  Location: [facility] ORS;  Service: Cardiovascular;  Laterality: N/A;  . VIDEO BRONCHOSCOPY N/A 10/29/2019   Procedure: VIDEO BRONCHOSCOPY WITH ENDOBRONCHICAL ULTRASOUND;  Surgeon: Tyler Pita, MD;  Location: [facility] ORS;  Service: Cardiopulmonary;  Laterality: N/A;    Social History   Socioeconomic History  . Marital status: Married    Spouse name: Not on file  . Number of children: Not on file  . Years of education: Not on file  . Highest education level: Not on file  Occupational History  . Not on file  Tobacco Use  . Smoking status: Former Smoker    Types: Cigarettes    Quit date: 2004    Years since quitting: 17.3  . Smokeless tobacco: Current User  . Tobacco comment: Quit 2004  Substance and Sexual Activity  . Alcohol use: No  . Drug use: No  . Sexual activity: Not Currently  Other Topics Concern  . Not on file  Social History Narrative  . Not on file   Social Determinants of  Health   Financial Resource Strain:   . Difficulty of Paying Living Expenses:   Food Insecurity:   . Worried About Charity fundraiser in the Last Year:   . Arboriculturist in the Last Year:   Transportation Needs:   . Film/video editor (Medical):   Marland Kitchen Lack of Transportation (Non-Medical):   Physical Activity:   . Days of Exercise per Week:   . Minutes of Exercise per Session:   Stress:   . Feeling of Stress :   Social Connections:   . Frequency of Communication with Friends and Family:   . Frequency of Social Gatherings with Friends and Family:   . Attends Religious Services:   . Active Member of Clubs or Organizations:   . Attends Archivist Meetings:   Marland Kitchen Marital Status:   Intimate Partner Violence:   . Fear of Current or Ex-Partner:   . Emotionally Abused:   Marland Kitchen Physically Abused:   . Sexually Abused:     Family History  Problem Relation Age of Onset  . CAD Mother   .  CAD Father        a. MI at 57-->death  . CAD Brother      Current Outpatient Medications:  .  acetaminophen (TYLENOL) 500 MG tablet, Take 1,000 mg by mouth every 6 (six) hours as needed for moderate pain. , Disp: , Rfl:  .  empagliflozin (JARDIANCE) 25 MG TABS tablet, Take 25 mg by mouth daily., Disp: , Rfl:  .  gabapentin (NEURONTIN) 300 MG capsule, Take 1 capsule (300 mg total) by mouth 3 (three) times daily., Disp: 90 capsule, Rfl: 0 .  glipiZIDE (GLUCOTROL) 10 MG tablet, Take 10 mg by mouth daily before breakfast. , Disp: , Rfl:  .  LORazepam (ATIVAN) 0.5 MG tablet, Take 1 tablet (0.5 mg total) by mouth 2 (two) times daily as needed for anxiety., Disp: 60 tablet, Rfl: 0 .  losartan (COZAAR) 50 MG tablet, Take 50 mg by mouth daily. , Disp: , Rfl:  .  Omega-3 Fatty Acids (FISH OIL) 1200 MG CAPS, Take 2,400 mg by mouth at bedtime. , Disp: , Rfl:  .  omeprazole (PRILOSEC) 20 MG capsule, Take 1 capsule by mouth as needed., Disp: , Rfl:  .  rivaroxaban (XARELTO) 20 MG TABS tablet, Take 1  tablet (20 mg total) by mouth daily with supper., Disp: 90 tablet, Rfl: 2 .  ezetimibe (ZETIA) 10 MG tablet, Take 1 tablet (10 mg total) by mouth daily. (Patient not taking: Reported on 04/09/2020), Disp: 30 tablet, Rfl: 3 .  Glycopyrrolate-Formoterol (BEVESPI AEROSPHERE) 9-4.8 MCG/ACT AERO, Inhale 2 puffs into the lungs 2 (two) times daily. (Patient not taking: Reported on 04/09/2020), Disp: 10.7 g, Rfl: 11 .  HYDROcodone-homatropine (HYCODAN) 5-1.5 MG/5ML syrup, Take 5 mLs by mouth every 6 (six) hours as needed for cough. (Patient not taking: Reported on 04/09/2020), Disp: 120 mL, Rfl: 0 .  metoprolol tartrate (LOPRESSOR) 25 MG tablet, Take 1 tablet (25 mg total) by mouth 2 (two) times daily. (Patient taking differently: Take 25 mg by mouth 1 day or 1 dose. ), Disp: 180 tablet, Rfl: 0 .  morphine (ROXANOL) 20 MG/ML concentrated solution, Take 1 mL (20 mg total) by mouth every 4 (four) hours as needed for severe pain. (Patient not taking: Reported on 04/09/2020), Disp: 60 mL, Rfl: 0 .  sertraline (ZOLOFT) 25 MG tablet, Take 1 tablet (25 mg total) by mouth daily. (Patient not taking: Reported on 04/09/2020), Disp: 90 tablet, Rfl: 0  Physical exam:  Vitals:   04/09/20 0858  BP: 122/74  Pulse: 71  Resp: 16  Temp: (!) 94.1 F (34.5 C)  TempSrc: Tympanic  Weight: 238 lb 14.4 oz (108.4 kg)   Physical Exam Constitutional:      General: He is not in acute distress. HENT:     Head: Normocephalic and atraumatic.  Eyes:     Pupils: Pupils are equal, round, and reactive to light.  Cardiovascular:     Rate and Rhythm: Normal rate and regular rhythm.     Heart sounds: Normal heart sounds.  Pulmonary:     Effort: Pulmonary effort is normal.     Breath sounds: Normal breath sounds.  Abdominal:     General: Bowel sounds are normal.     Palpations: Abdomen is soft.  Musculoskeletal:     Cervical back: Normal range of motion.  Skin:    General: Skin is warm and dry.  Neurological:     Mental Status:  He is alert and oriented to person, place, and time.  CMP Latest Ref Rng & Units 04/09/2020  Glucose 70 - 99 mg/dL 147(H)  BUN 8 - 23 mg/dL 21  Creatinine 0.61 - 1.24 mg/dL 0.65  Sodium 135 - 145 mmol/L 137  Potassium 3.5 - 5.1 mmol/L 3.9  Chloride 98 - 111 mmol/L 103  CO2 22 - 32 mmol/L 25  Calcium 8.9 - 10.3 mg/dL 9.3  Total Protein 6.5 - 8.1 g/dL 7.3  Total Bilirubin 0.3 - 1.2 mg/dL 0.6  Alkaline Phos 38 - 126 U/L 67  AST 15 - 41 U/L 14(L)  ALT 0 - 44 U/L 13   CBC Latest Ref Rng & Units 04/09/2020  WBC 4.0 - 10.5 K/uL 4.8  Hemoglobin 13.0 - 17.0 g/dL 13.8  Hematocrit 39.0 - 52.0 % 41.4  Platelets 150 - 400 K/uL 247     Assessment and plan- Patient is a 70 y.o. 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  He is here for on treatment assessment prior to cycle 6 of maintenance durvalumab  Patient has been having generalized fatigue diffuse body aches since he started durvalumab.  Patient's TSH prior to starting Durvalumab was mildly low at 0.25.  It was checked on 02/13/2020 again when it was 0.19 with a normal free T4 level.  Today his TSH is undetectable at less than 1.01.  Free T3 is elevated at 9.1 and free for is elevated at 2.44.  Serum cortisol is normal at 13.9 and ACTH is normal.  These labs are not suggestive of hypophysitis or panhypopituitarism.  However I will check an MRI brain to rule out.  In hypophysitis we would have expected him to have low TSH and a low T3-T4 along with a low cortisol level.  I am checking a CK and aldolase level to rule out any myositis given his symptoms  I suspect patient has thyroiditis and it is possible that subsequently patient may develop hypothyroidism if it is in the spectrum of autoimmune complications from durvalumab.  Patient's baseline heart rate is currently normal at 71.  He is not having significant diarrhea.  I will hold off on giving him propranolol and refer him to endocrinology for further management.  I am also  holding off on giving him durvalumab at this time.  He is due for repeat CT chest abdomen pelvis with contrast in 2 weeks time which he will proceed with.  I will see him back in 6 weeks with CBC with differential, CMP, TSH, free t3 and t4 for possible durvalumab based on endocrinology recommendations   Visit Diagnosis 1. Malignant neoplasm of upper lobe of left lung ([place])   2. Muscle pain   3. Thyroiditis      [name], MD, MPH [facility] at [facility] [phone] 04/12/2020 1:36 PM

## 2020-04-13 ENCOUNTER — Telehealth: Payer: Self-pay | Admitting: *Deleted

## 2020-04-13 NOTE — Telephone Encounter (Signed)
Called patient to let them know that Dr. Janese Banks had ordered extra labs to see if we can figure out what is going on that is causing him such fatigue and diffuse body aches.  The labs showed that his TSH her thyroid was actually low and she would like to refer him to endocrinology.  The only endocrinology we have here in [place] is at [facility] clinic which is attached to her building and we have sent a referral over there but usually takes 2 to 3 weeks in order to get an appointment.  Patient is agreeable to go to the appointment.  Also with the Imfinzi you can have a problem with your pituitary gland so Dr. Janese Banks would like him to have an MRI to see if there is anything abnormal there.  The earliest appointment we can get for his MRI is on the [facility] 11:00 appointment patient is agreeable for that also and if he has any questions in the future he can call our office

## 2020-04-15 ENCOUNTER — Telehealth: Payer: Self-pay

## 2020-04-15 NOTE — Telephone Encounter (Signed)
Dr. Janese Banks called and spoke to pt and he wants his scans cancelled the CT for fri. And the MRI for mon. He will go to the endocrine appt on 4/27. I called scheduling and spoke to [facility] and cancelled both scans for 4/23 and 4/26

## 2020-04-15 NOTE — Telephone Encounter (Signed)
Patient called stating that he is done with everything and does not want to do the CT Scan nor the MRI. I asked patient if there was something I could help him with or how I could help him do at least the images and he stated that he was just done and that there was nothing for me to do for him. I also asked him if I could schedule an appointment so he could come in and talk to Dr. Janese Banks and he stated that he did not want any more appointments. Patient then told me to cancel everything and hung up. Can you please advise or would you be able to give him a call.

## 2020-04-15 NOTE — Telephone Encounter (Signed)
I will call him today.

## 2020-04-16 ENCOUNTER — Ambulatory Visit: Admission: RE | Admit: 2020-04-16 | Payer: Medicare Other | Source: Ambulatory Visit

## 2020-04-19 ENCOUNTER — Ambulatory Visit: Payer: Medicare Other

## 2020-04-23 ENCOUNTER — Ambulatory Visit: Payer: Medicare Other

## 2020-04-23 ENCOUNTER — Ambulatory Visit: Payer: Medicare Other | Admitting: Internal Medicine

## 2020-04-23 ENCOUNTER — Ambulatory Visit: Payer: Medicare Other | Admitting: Oncology

## 2020-04-23 ENCOUNTER — Other Ambulatory Visit: Payer: Medicare Other

## 2020-05-03 ENCOUNTER — Encounter: Payer: Self-pay | Admitting: Oncology

## 2020-05-03 ENCOUNTER — Other Ambulatory Visit: Payer: Self-pay | Admitting: Oncology

## 2020-05-03 MED ORDER — GABAPENTIN 300 MG PO CAPS: 300.0000 mg | ORAL_CAPSULE | Freq: Three times a day (TID) | ORAL | 0 refills | Status: DC

## 2020-05-04 ENCOUNTER — Encounter: Payer: Self-pay | Admitting: *Deleted

## 2020-05-06 ENCOUNTER — Other Ambulatory Visit: Payer: Self-pay | Admitting: Oncology

## 2020-05-06 DIAGNOSIS — C3412 Malignant neoplasm of upper lobe, left bronchus or lung: Secondary | ICD-10-CM

## 2020-05-06 MED ORDER — LORAZEPAM 0.5 MG PO TABS: 0.5000 mg | ORAL_TABLET | Freq: Two times a day (BID) | ORAL | 0 refills | Status: DC | PRN

## 2020-05-14 ENCOUNTER — Other Ambulatory Visit: Payer: Self-pay

## 2020-05-14 ENCOUNTER — Ambulatory Visit
Admission: RE | Admit: 2020-05-14 | Discharge: 2020-05-14 | Disposition: A | Discharge status: Home / Self Care | Payer: Medicare Other | Source: Ambulatory Visit | Attending: Oncology | Admitting: Oncology

## 2020-05-14 DIAGNOSIS — C3412 Malignant neoplasm of upper lobe, left bronchus or lung: Secondary | ICD-10-CM | POA: Insufficient documentation

## 2020-05-14 LAB — POCT I-STAT CREATININE: Creatinine, Ser: 0.7 mg/dL (ref 0.61–1.24)

## 2020-05-14 MED ORDER — IOHEXOL 300 MG/ML  SOLN
100.0000 mL | Freq: Once | INTRAMUSCULAR | Status: AC | PRN
  Administered 2020-05-14: 100 mL via INTRAVENOUS

## 2020-05-14 NOTE — Progress Notes (Signed)

## 2020-05-21 ENCOUNTER — Inpatient Hospital Stay: Payer: Medicare Other

## 2020-05-21 ENCOUNTER — Inpatient Hospital Stay (HOSPITAL_BASED_OUTPATIENT_CLINIC_OR_DEPARTMENT_OTHER): Payer: Medicare Other | Admitting: Oncology

## 2020-05-21 ENCOUNTER — Encounter: Payer: Self-pay | Admitting: Oncology

## 2020-05-21 ENCOUNTER — Inpatient Hospital Stay: Payer: Medicare Other | Attending: Oncology

## 2020-05-21 ENCOUNTER — Inpatient Hospital Stay: Payer: Medicare Other | Admitting: Oncology

## 2020-05-21 ENCOUNTER — Other Ambulatory Visit: Payer: Self-pay

## 2020-05-21 VITALS — BP 120/85 | HR 72 | Temp 95.8°F | Wt 239.0 lb

## 2020-05-21 DIAGNOSIS — C3412 Malignant neoplasm of upper lobe, left bronchus or lung: Secondary | ICD-10-CM | POA: Insufficient documentation

## 2020-05-21 DIAGNOSIS — I2510 Atherosclerotic heart disease of native coronary artery without angina pectoris: Secondary | ICD-10-CM | POA: Insufficient documentation

## 2020-05-21 DIAGNOSIS — K219 Gastro-esophageal reflux disease without esophagitis: Secondary | ICD-10-CM | POA: Diagnosis not present

## 2020-05-21 DIAGNOSIS — Z08 Encounter for follow-up examination after completed treatment for malignant neoplasm: Secondary | ICD-10-CM | POA: Diagnosis not present

## 2020-05-21 DIAGNOSIS — I10 Essential (primary) hypertension: Secondary | ICD-10-CM | POA: Diagnosis not present

## 2020-05-21 DIAGNOSIS — Z87891 Personal history of nicotine dependence: Secondary | ICD-10-CM | POA: Insufficient documentation

## 2020-05-21 DIAGNOSIS — Z85118 Personal history of other malignant neoplasm of bronchus and lung: Secondary | ICD-10-CM

## 2020-05-21 DIAGNOSIS — E785 Hyperlipidemia, unspecified: Secondary | ICD-10-CM | POA: Diagnosis not present

## 2020-05-21 DIAGNOSIS — F329 Major depressive disorder, single episode, unspecified: Secondary | ICD-10-CM | POA: Diagnosis not present

## 2020-05-21 DIAGNOSIS — Z95828 Presence of other vascular implants and grafts: Secondary | ICD-10-CM

## 2020-05-21 DIAGNOSIS — M1990 Unspecified osteoarthritis, unspecified site: Secondary | ICD-10-CM | POA: Diagnosis not present

## 2020-05-21 DIAGNOSIS — Z79899 Other long term (current) drug therapy: Secondary | ICD-10-CM | POA: Diagnosis not present

## 2020-05-21 DIAGNOSIS — E0590 Thyrotoxicosis, unspecified without thyrotoxic crisis or storm: Secondary | ICD-10-CM | POA: Diagnosis not present

## 2020-05-21 DIAGNOSIS — E1159 Type 2 diabetes mellitus with other circulatory complications: Secondary | ICD-10-CM | POA: Insufficient documentation

## 2020-05-21 DIAGNOSIS — I252 Old myocardial infarction: Secondary | ICD-10-CM | POA: Insufficient documentation

## 2020-05-21 DIAGNOSIS — E069 Thyroiditis, unspecified: Secondary | ICD-10-CM

## 2020-05-21 DIAGNOSIS — Z7901 Long term (current) use of anticoagulants: Secondary | ICD-10-CM | POA: Diagnosis not present

## 2020-05-21 DIAGNOSIS — Z951 Presence of aortocoronary bypass graft: Secondary | ICD-10-CM | POA: Diagnosis not present

## 2020-05-21 LAB — CBC WITH DIFFERENTIAL/PLATELET
Abs Immature Granulocytes: 0.02 10*3/uL (ref 0.00–0.07)
Basophils Absolute: 0 10*3/uL (ref 0.0–0.1)
Basophils Relative: 1 %
Eosinophils Absolute: 0.2 10*3/uL (ref 0.0–0.5)
Eosinophils Relative: 3 %
HCT: 43.1 % (ref 39.0–52.0)
Hemoglobin: 14.4 g/dL (ref 13.0–17.0)
Immature Granulocytes: 0 %
Lymphocytes Relative: 12 %
Lymphs Abs: 0.8 10*3/uL (ref 0.7–4.0)
MCH: 28.3 pg (ref 26.0–34.0)
MCHC: 33.4 g/dL (ref 30.0–36.0)
MCV: 84.7 fL (ref 80.0–100.0)
Monocytes Absolute: 0.7 10*3/uL (ref 0.1–1.0)
Monocytes Relative: 11 %
Neutro Abs: 4.4 10*3/uL (ref 1.7–7.7)
Neutrophils Relative %: 73 %
Platelets: 258 10*3/uL (ref 150–400)
RBC: 5.09 MIL/uL (ref 4.22–5.81)
RDW: 14.4 % (ref 11.5–15.5)
WBC: 6.1 10*3/uL (ref 4.0–10.5)
nRBC: 0 % (ref 0.0–0.2)

## 2020-05-21 LAB — TSH: TSH: 0.102 u[IU]/mL — ABNORMAL LOW (ref 0.350–4.500)

## 2020-05-21 LAB — COMPREHENSIVE METABOLIC PANEL
ALT: 14 U/L (ref 0–44)
AST: 15 U/L (ref 15–41)
Albumin: 4.1 g/dL (ref 3.5–5.0)
Alkaline Phosphatase: 77 U/L (ref 38–126)
Anion gap: 9 (ref 5–15)
BUN: 19 mg/dL (ref 8–23)
CO2: 23 mmol/L (ref 22–32)
Calcium: 9 mg/dL (ref 8.9–10.3)
Chloride: 102 mmol/L (ref 98–111)
Creatinine, Ser: 0.71 mg/dL (ref 0.61–1.24)
GFR calc Af Amer: >60 mL/min (ref >60)
GFR calc non Af Amer: >60 mL/min (ref >60)
Glucose, Bld: 152 mg/dL — ABNORMAL HIGH (ref 70–99)
Potassium: 3.8 mmol/L (ref 3.5–5.1)
Sodium: 134 mmol/L — ABNORMAL LOW (ref 135–145)
Total Bilirubin: 0.6 mg/dL (ref 0.3–1.2)
Total Protein: 7.3 g/dL (ref 6.5–8.1)

## 2020-05-21 MED ORDER — AMOXICILLIN-POT CLAVULANATE 875-125 MG PO TABS
1.0000 | ORAL_TABLET | Freq: Two times a day (BID) | ORAL | 0 refills | Status: DC
Start: 2020-05-21 — End: 2020-07-01

## 2020-05-21 MED ORDER — SODIUM CHLORIDE 0.9% FLUSH
10.0000 mL | INTRAVENOUS | Status: DC | PRN
  Administered 2020-05-21: 10 mL via INTRAVENOUS
  Filled 2020-05-21: qty 10

## 2020-05-21 MED ORDER — HEPARIN SOD (PORK) LOCK FLUSH 100 UNIT/ML IV SOLN
500.0000 [IU] | Freq: Once | INTRAVENOUS | Status: AC
  Administered 2020-05-21: 500 [IU] via INTRAVENOUS
  Filled 2020-05-21: qty 5

## 2020-05-22 LAB — T3, FREE: T3, Free: 3.2 pg/mL (ref 2.0–4.4)

## 2020-05-22 LAB — T4: T4, Total: 7.1 ug/dL (ref 4.5–12.0)

## 2020-05-24 NOTE — Progress Notes (Signed)
Hematology/Oncology Consult note [facility]  [phone] [phone]  Patient Care Team: Center, [facility] as PCP - General ([place]) Telford Nab, RN as Registered Nurse   Name of the patient: Derrick Gallegos  [id number]  04/10/1951   Date of visit: 05/24/20  Diagnosis- Stage IIIC SCC lung [id number]  Chief complaint/ Reason for visit- routine f/u of lung cancer  Heme/Onc history: patient is a 69 year old malewho was a former smoker and smoked about 2 to 3 packs/day for many years and quit smoking a few years ago. He presented to the ER with symptoms of cough and shortness of breathWhich prompted [place] chest x-ray followed by a CT scan. CT scan showed a 3.4 x 3.2 cm left perihilar mass along the left paratracheal precarinal and subcarinal vascular and left hilar adenopathy. Patient has been referred for further management.  PET CT scan showed a large left upper lobe lung mass measuring 6.3 x 3.4 cm. Bilateral mediastinal adenopathy as well as left supraclavicular adenopathy. 3.4 cm partially exophytic lesion of the right kidney. No evidence of distant metastatic disease.  Patient has completed concurrent chemo/RT. Scans showe dpartial response. Maintenance durvalumab started in feb 2021  Patient has met with Dr. Erlene Quan for right renal mass which is being monitored for now and consideration for cryoablation in the future   Interval history- Patient is currently on methimazole for hyperthyroidism. Reports headache, fatigue is somewhat improved. Still reports pain his b/l thighs. Has anxiety but is not taking his zoloft  ECOG PS- 1 Pain scale- 0   Review of systems- ROS    No Known Allergies   Past Medical History:  Diagnosis Date  . Anxiety   . Arthritis   . Atrial flutter ([place])    a. diagnosed 5/19; b. s/p TEE/DCCV 05/2018 by Dr. Humphrey Rolls; c. CHADS2VASc => 4 (HTN, age x1, DM, vascular disease)  .  Complication of anesthesia    "wakes up too early"  . Coronary artery disease    a. s/p 3-v cabg in 2004 at [place] (LIMA and radial arery used - no further details in Care Everywhere); b. LHC 8/19 underlying multi-V dz, patent LIMA-LAD, occluded VG-PDA, 90-95% stenosis of LCx s/p PCI/DES x 2  . Cough   . Depression   . Diabetes mellitus with complication ([place])   . Dyspnea   . Dysrhythmia    atrial fib.  . Essential hypertension   . Family history of premature CAD    a. father passed away from an MI at 31  . GERD (gastroesophageal reflux disease)   . Heart murmur   . HLD (hyperlipidemia)    a. statin intolerant   . Lung cancer ([place]) 09/2019   Chemo + Rad tx's.   . Mass of left lung   . MI (myocardial infarction) ([place])    2004  . Obesity      Past Surgical History:  Procedure Laterality Date  . CARDIAC CATHETERIZATION  2004  . CARDIAC CATHETERIZATION  2019   x2 stents [facility]  . CARDIAC SURGERY    . CARDIOVERSION N/A 06/20/2018   Procedure: CARDIOVERSION;  Surgeon: Dionisio David, MD;  Location: [facility] ORS;  Service: Cardiovascular;  Laterality: N/A;  . CORONARY ARTERY BYPASS GRAFT    . CORONARY STENT INTERVENTION N/A 08/08/2018   Procedure: CORONARY STENT INTERVENTION;  Surgeon: Isaias Cowman, MD;  Location: [place] [facility];  Service: Cardiovascular;  Laterality: N/A;  . LEFT HEART CATH AND CORONARY  ANGIOGRAPHY Left 08/08/2018   Procedure: LEFT HEART CATH AND CORONARY ANGIOGRAPHY;  Surgeon: Dionisio David, MD;  Location: [place] [facility];  Service: Cardiovascular;  Laterality: Left;  . PORTA CATH INSERTION N/A 11/10/2019   Procedure: PORTA CATH INSERTION;  Surgeon: Algernon Huxley, MD;  Location: [place] [facility];  Service: Cardiovascular;  Laterality: N/A;  . TEE WITHOUT CARDIOVERSION N/A 06/20/2018   Procedure: TRANSESOPHAGEAL ECHOCARDIOGRAM (TEE);  Surgeon: Dionisio David, MD;  Location: [facility] ORS;  Service: Cardiovascular;  Laterality: N/A;  . VIDEO BRONCHOSCOPY  N/A 10/29/2019   Procedure: VIDEO BRONCHOSCOPY WITH ENDOBRONCHICAL ULTRASOUND;  Surgeon: Tyler Pita, MD;  Location: [facility] ORS;  Service: Cardiopulmonary;  Laterality: N/A;    Social History   Socioeconomic History  . Marital status: Married    Spouse name: Not on file  . Number of children: Not on file  . Years of education: Not on file  . Highest education level: Not on file  Occupational History  . Not on file  Tobacco Use  . Smoking status: Former Smoker    Types: Cigarettes    Quit date: 2004    Years since quitting: 17.4  . Smokeless tobacco: Current User  . Tobacco comment: Quit 2004  Substance and Sexual Activity  . Alcohol use: No  . Drug use: No  . Sexual activity: Not Currently  Other Topics Concern  . Not on file  Social History Narrative  . Not on file   Social Determinants of Health   Financial Resource Strain:   . Difficulty of Paying Living Expenses:   Food Insecurity:   . Worried About Charity fundraiser in the Last Year:   . Arboriculturist in the Last Year:   Transportation Needs:   . Film/video editor (Medical):   Marland Kitchen Lack of Transportation (Non-Medical):   Physical Activity:   . Days of Exercise per Week:   . Minutes of Exercise per Session:   Stress:   . Feeling of Stress :   Social Connections:   . Frequency of Communication with Friends and Family:   . Frequency of Social Gatherings with Friends and Family:   . Attends Religious Services:   . Active Member of Clubs or Organizations:   . Attends Archivist Meetings:   Marland Kitchen Marital Status:   Intimate Partner Violence:   . Fear of Current or Ex-Partner:   . Emotionally Abused:   Marland Kitchen Physically Abused:   . Sexually Abused:     Family History  Problem Relation Age of Onset  . CAD Mother   . CAD Father        a. MI at 57-->death  . CAD Brother      Current Outpatient Medications:  .  acetaminophen (TYLENOL) 500 MG tablet, Take 1,000 mg by mouth every 6 (six) hours as  needed for moderate pain. , Disp: , Rfl:  .  empagliflozin (JARDIANCE) 25 MG TABS tablet, Take 25 mg by mouth daily., Disp: , Rfl:  .  gabapentin (NEURONTIN) 300 MG capsule, Take 1 capsule (300 mg total) by mouth 3 (three) times daily., Disp: 90 capsule, Rfl: 0 .  glipiZIDE (GLUCOTROL) 10 MG tablet, Take 10 mg by mouth daily before breakfast. , Disp: , Rfl:  .  LORazepam (ATIVAN) 0.5 MG tablet, Take 1 tablet (0.5 mg total) by mouth 2 (two) times daily as needed for anxiety., Disp: 60 tablet, Rfl: 0 .  losartan (COZAAR) 50 MG tablet, Take 50  mg by mouth daily. , Disp: , Rfl:  .  methimazole (TAPAZOLE) 10 MG tablet, Take 10 mg by mouth in the morning and at bedtime., Disp: , Rfl:  .  Omega-3 Fatty Acids (FISH OIL) 1200 MG CAPS, Take 2,400 mg by mouth at bedtime. , Disp: , Rfl:  .  rivaroxaban (XARELTO) 20 MG TABS tablet, Take 1 tablet (20 mg total) by mouth daily with supper., Disp: 90 tablet, Rfl: 2 .  amoxicillin-clavulanate (AUGMENTIN) 875-125 MG tablet, Take 1 tablet by mouth 2 (two) times daily., Disp: 14 tablet, Rfl: 0 .  ezetimibe (ZETIA) 10 MG tablet, Take 1 tablet (10 mg total) by mouth daily. (Patient not taking: Reported on 04/09/2020), Disp: 30 tablet, Rfl: 3 .  Glycopyrrolate-Formoterol (BEVESPI AEROSPHERE) 9-4.8 MCG/ACT AERO, Inhale 2 puffs into the lungs 2 (two) times daily. (Patient not taking: Reported on 04/09/2020), Disp: 10.7 g, Rfl: 11 .  HYDROcodone-homatropine (HYCODAN) 5-1.5 MG/5ML syrup, Take 5 mLs by mouth every 6 (six) hours as needed for cough. (Patient not taking: Reported on 04/09/2020), Disp: 120 mL, Rfl: 0 .  metoprolol tartrate (LOPRESSOR) 25 MG tablet, Take 1 tablet (25 mg total) by mouth 2 (two) times daily. (Patient taking differently: Take 25 mg by mouth 1 day or 1 dose. ), Disp: 180 tablet, Rfl: 0 .  morphine (ROXANOL) 20 MG/ML concentrated solution, Take 1 mL (20 mg total) by mouth every 4 (four) hours as needed for severe pain. (Patient not taking: Reported on  04/09/2020), Disp: 60 mL, Rfl: 0 .  omeprazole (PRILOSEC) 20 MG capsule, Take 1 capsule by mouth as needed., Disp: , Rfl:  .  sertraline (ZOLOFT) 25 MG tablet, Take 1 tablet (25 mg total) by mouth daily. (Patient not taking: Reported on 04/09/2020), Disp: 90 tablet, Rfl: 0  Physical exam:  Vitals:   05/21/20 0823  BP: 120/85  Pulse: 72  Temp: (!) 95.8 F (35.4 C)  TempSrc: Tympanic  Weight: 239 lb (108.4 kg)   Physical Exam Constitutional:      General: He is not in acute distress. Cardiovascular:     Rate and Rhythm: Normal rate and regular rhythm.     Heart sounds: Normal heart sounds.  Pulmonary:     Effort: Pulmonary effort is normal.     Breath sounds: Normal breath sounds.  Abdominal:     General: Bowel sounds are normal.     Palpations: Abdomen is soft.  Skin:    General: Skin is warm and dry.  Neurological:     Mental Status: He is oriented to person, place, and time.      CMP Latest Ref Rng & Units 05/21/2020  Glucose 70 - 99 mg/dL 152(H)  BUN 8 - 23 mg/dL 19  Creatinine 0.61 - 1.24 mg/dL 0.71  Sodium 135 - 145 mmol/L 134(L)  Potassium 3.5 - 5.1 mmol/L 3.8  Chloride 98 - 111 mmol/L 102  CO2 22 - 32 mmol/L 23  Calcium 8.9 - 10.3 mg/dL 9.0  Total Protein 6.5 - 8.1 g/dL 7.3  Total Bilirubin 0.3 - 1.2 mg/dL 0.6  Alkaline Phos 38 - 126 U/L 77  AST 15 - 41 U/L 15  ALT 0 - 44 U/L 14   CBC Latest Ref Rng & Units 05/21/2020  WBC 4.0 - 10.5 K/uL 6.1  Hemoglobin 13.0 - 17.0 g/dL 14.4  Hematocrit 39.0 - 52.0 % 43.1  Platelets 150 - 400 K/uL 258    No images are attached to the encounter.  CT Chest W Contrast  Result Date: 05/14/2020 CLINICAL DATA:  Follow-up left-sided lung cancer, status post chemo and radiation, right renal mass EXAM: CT CHEST, ABDOMEN, AND PELVIS WITH CONTRAST TECHNIQUE: Multidetector CT imaging of the chest, abdomen and pelvis was performed following the standard protocol during bolus administration of intravenous contrast. CONTRAST:  152m  OMNIPAQUE IOHEXOL 300 MG/ML SOLN, additional oral enteric contrast COMPARISON:  CT chest abdomen pelvis, 01/21/2020, MR abdomen, 01/12/2020, PET-CT, 10/14/2019 FINDINGS: CT CHEST FINDINGS Cardiovascular: Aortic atherosclerosis. Normal heart size. Extensive 3 vessel coronary artery calcifications and/or stents. No pericardial effusion. Mediastinum/Nodes: Slight interval decrease in size of multiple enlarged left hilar and pretracheal lymph nodes, index pretracheal node measuring 2.3 x 1.9 cm, previously 2.6 x 2.3 cm (series 2, 19). Thyroid gland, trachea, and esophagus demonstrate no significant findings. Lungs/Pleura: There is a new small left pleural effusion and associated atelectasis or consolidation. Significantly increased, somewhat bandlike consolidation of the perihilar left lung and new paramedian consolidation of the right upper lobe. Musculoskeletal: No chest wall mass or suspicious bone lesions identified. CT ABDOMEN PELVIS FINDINGS Hepatobiliary: No solid liver abnormality is seen. No gallstones, gallbladder wall thickening, or biliary dilatation. Pancreas: Unremarkable. No pancreatic ductal dilatation or surrounding inflammatory changes. Spleen: Normal in size without significant abnormality. Adrenals/Urinary Tract: Adrenal glands are unremarkable. There is a heterogeneous, partially exophytic mass of the anterior midportion of the right kidney measuring 3.7 x 3.0 cm, slightly enlarged compared to prior MR at which time it measured 3.3 x 3.3 cm (series 2, image 73). Bladder is unremarkable. Stomach/Bowel: Stomach is within normal limits. Appendix appears normal. No evidence of bowel wall thickening, distention, or inflammatory changes. Vascular/Lymphatic: Aortic atherosclerosis. No enlarged abdominal or pelvic lymph nodes. Reproductive: No mass or other abnormality. Other: Fat containing bilateral inguinal hernias. No abdominopelvic ascites. Musculoskeletal: No acute or significant osseous findings.  IMPRESSION: 1. Significantly increased, somewhat bandlike consolidation of the perihilar left lung as well as of the paramedian right upper lobe, findings consistent with developing radiation change. Attention on follow-up. 2. Slight interval decrease in size of multiple enlarged left hilar and pretracheal lymph nodes, consistent with treatment response of nodal metastatic disease. 3. New small left pleural effusion and associated atelectasis or consolidation. 4. There is a heterogeneous, partially exophytic mass of the anterior midportion of the right kidney measuring 3.7 x 3.0 cm, slightly enlarged compared to prior MR dated 01/12/2020 and consistent with renal cell carcinoma. 5. No evidence of distant metastatic disease in the abdomen or pelvis. 6. Coronary artery disease.  Aortic Atherosclerosis (ICD10-I70.0). Electronically Signed   By: AEddie CandleM.D.   On: 05/14/2020 16:34   CT ABDOMEN PELVIS W CONTRAST  Result Date: 05/14/2020 CLINICAL DATA:  Follow-up left-sided lung cancer, status post chemo and radiation, right renal mass EXAM: CT CHEST, ABDOMEN, AND PELVIS WITH CONTRAST TECHNIQUE: Multidetector CT imaging of the chest, abdomen and pelvis was performed following the standard protocol during bolus administration of intravenous contrast. CONTRAST:  1084mOMNIPAQUE IOHEXOL 300 MG/ML SOLN, additional oral enteric contrast COMPARISON:  CT chest abdomen pelvis, 01/21/2020, MR abdomen, 01/12/2020, PET-CT, 10/14/2019 FINDINGS: CT CHEST FINDINGS Cardiovascular: Aortic atherosclerosis. Normal heart size. Extensive 3 vessel coronary artery calcifications and/or stents. No pericardial effusion. Mediastinum/Nodes: Slight interval decrease in size of multiple enlarged left hilar and pretracheal lymph nodes, index pretracheal node measuring 2.3 x 1.9 cm, previously 2.6 x 2.3 cm (series 2, 19). Thyroid gland, trachea, and esophagus demonstrate no significant findings. Lungs/Pleura: There is a new small left pleural  effusion and associated atelectasis  or consolidation. Significantly increased, somewhat bandlike consolidation of the perihilar left lung and new paramedian consolidation of the right upper lobe. Musculoskeletal: No chest wall mass or suspicious bone lesions identified. CT ABDOMEN PELVIS FINDINGS Hepatobiliary: No solid liver abnormality is seen. No gallstones, gallbladder wall thickening, or biliary dilatation. Pancreas: Unremarkable. No pancreatic ductal dilatation or surrounding inflammatory changes. Spleen: Normal in size without significant abnormality. Adrenals/Urinary Tract: Adrenal glands are unremarkable. There is a heterogeneous, partially exophytic mass of the anterior midportion of the right kidney measuring 3.7 x 3.0 cm, slightly enlarged compared to prior MR at which time it measured 3.3 x 3.3 cm (series 2, image 73). Bladder is unremarkable. Stomach/Bowel: Stomach is within normal limits. Appendix appears normal. No evidence of bowel wall thickening, distention, or inflammatory changes. Vascular/Lymphatic: Aortic atherosclerosis. No enlarged abdominal or pelvic lymph nodes. Reproductive: No mass or other abnormality. Other: Fat containing bilateral inguinal hernias. No abdominopelvic ascites. Musculoskeletal: No acute or significant osseous findings. IMPRESSION: 1. Significantly increased, somewhat bandlike consolidation of the perihilar left lung as well as of the paramedian right upper lobe, findings consistent with developing radiation change. Attention on follow-up. 2. Slight interval decrease in size of multiple enlarged left hilar and pretracheal lymph nodes, consistent with treatment response of nodal metastatic disease. 3. New small left pleural effusion and associated atelectasis or consolidation. 4. There is a heterogeneous, partially exophytic mass of the anterior midportion of the right kidney measuring 3.7 x 3.0 cm, slightly enlarged compared to prior MR dated 01/12/2020 and consistent  with renal cell carcinoma. 5. No evidence of distant metastatic disease in the abdomen or pelvis. 6. Coronary artery disease.  Aortic Atherosclerosis (ICD10-I70.0). Electronically Signed   By: [name] M.D.   On: 05/14/2020 16:34     Assessment and plan- Patient is a 69 y.o. male  with stage IIIc squamous cell carcinoma of the lung. Maintenance durvalumab on hold since April 2021 due to symptomatic hyperthyroidism. He is here for routine f/u and discuss ct scan results  I have reviewed ct chest abdomen pelvis images independently. Discussed findings with the patient. Overall patient does not have any findings of recurrent or metastatic disease. At some point we need to restart his maintenance durvalumab. He feels a little better since starting methimazole but not back to baseline yet. I will give him another month and decide if we can restart at that time.   RCC- currently being monitored conservatively with plans for possible cryoablation in the future   Visit Diagnosis 1. Encounter for follow-up surveillance of lung cancer   2. Hyperthyroidism      [name], MD, MPH [facility] [phone] 05/24/2020 2:40 PM

## 2020-05-31 ENCOUNTER — Other Ambulatory Visit: Payer: Self-pay | Admitting: Oncology

## 2020-05-31 MED ORDER — GABAPENTIN 300 MG PO CAPS: 300.0000 mg | ORAL_CAPSULE | Freq: Three times a day (TID) | ORAL | 0 refills | Status: DC

## 2020-06-03 ENCOUNTER — Telehealth: Payer: Self-pay

## 2020-06-03 ENCOUNTER — Encounter: Payer: Self-pay | Admitting: Radiation Oncology

## 2020-06-03 MED ORDER — PREDNISONE 20 MG PO TABS: 40.0000 mg | ORAL_TABLET | Freq: Every day | ORAL | 0 refills | Status: DC

## 2020-06-03 NOTE — Telephone Encounter (Signed)
Pt's wife returning a phone. Pt's wife can be reached at [phone].

## 2020-06-03 NOTE — Telephone Encounter (Signed)
Lm for pt to obtain additional information. (please see 06/03/2020 mychart encounter)

## 2020-06-03 NOTE — Telephone Encounter (Signed)
Unable to speak with pt's spouse, as she is not listed on DPR. ' Spoke to pt, who reports of increased sob with exertion, non prod cough, wheezing and headache. symptoms have been present since starting chemo but have worsen over the past 49mo.  Pt completed course of Augmentin ine day ago with no relief in sx.  Pt had recent CT that showed a pleural effusion.  Denied fever, chills or sweats. Per Dr. Mortimer Fries verbally- prednisone 40mg  daily for 7 days.  Pt is aware of recommendations and voiced her understanding.  Nothing further is needed.

## 2020-06-07 ENCOUNTER — Other Ambulatory Visit: Payer: Self-pay | Admitting: Oncology

## 2020-06-07 ENCOUNTER — Encounter: Payer: Self-pay | Admitting: Oncology

## 2020-06-07 DIAGNOSIS — C3412 Malignant neoplasm of upper lobe, left bronchus or lung: Secondary | ICD-10-CM

## 2020-06-08 ENCOUNTER — Other Ambulatory Visit: Payer: Self-pay

## 2020-06-08 ENCOUNTER — Encounter: Payer: Self-pay | Admitting: Oncology

## 2020-06-08 ENCOUNTER — Other Ambulatory Visit: Payer: Self-pay | Admitting: Oncology

## 2020-06-08 MED ORDER — LORAZEPAM 0.5 MG PO TABS: 0.5000 mg | ORAL_TABLET | Freq: Two times a day (BID) | ORAL | 0 refills | Status: DC | PRN

## 2020-06-08 NOTE — Telephone Encounter (Signed)
Dr. Janese Banks pt

## 2020-06-09 ENCOUNTER — Other Ambulatory Visit: Payer: Self-pay | Admitting: *Deleted

## 2020-06-09 MED ORDER — RIVAROXABAN 20 MG PO TABS: 20.0000 mg | ORAL_TABLET | Freq: Every day | ORAL | 2 refills | Status: AC

## 2020-06-10 ENCOUNTER — Encounter: Payer: Self-pay | Admitting: *Deleted

## 2020-06-10 MED ORDER — HYDROCODONE-HOMATROPINE 5-1.5 MG/5ML PO SYRP: 5.0000 mL | ORAL_SOLUTION | Freq: Four times a day (QID) | ORAL | 0 refills | Status: DC | PRN

## 2020-06-14 ENCOUNTER — Ambulatory Visit (INDEPENDENT_AMBULATORY_CARE_PROVIDER_SITE_OTHER): Payer: Medicare Other | Admitting: Internal Medicine

## 2020-06-14 ENCOUNTER — Other Ambulatory Visit: Payer: Self-pay

## 2020-06-14 ENCOUNTER — Encounter: Payer: Self-pay | Admitting: Internal Medicine

## 2020-06-14 VITALS — BP 132/74 | HR 75 | Ht 71.0 in | Wt 240.5 lb

## 2020-06-14 DIAGNOSIS — I4892 Unspecified atrial flutter: Secondary | ICD-10-CM | POA: Diagnosis not present

## 2020-06-14 DIAGNOSIS — I2510 Atherosclerotic heart disease of native coronary artery without angina pectoris: Secondary | ICD-10-CM

## 2020-06-14 DIAGNOSIS — R0602 Shortness of breath: Secondary | ICD-10-CM | POA: Diagnosis not present

## 2020-06-14 DIAGNOSIS — R079 Chest pain, unspecified: Secondary | ICD-10-CM

## 2020-06-14 DIAGNOSIS — C3490 Malignant neoplasm of unspecified part of unspecified bronchus or lung: Secondary | ICD-10-CM

## 2020-06-14 DIAGNOSIS — E785 Hyperlipidemia, unspecified: Secondary | ICD-10-CM

## 2020-06-14 MED ORDER — NITROGLYCERIN 0.4 MG SL SUBL: 0.4000 mg | SUBLINGUAL_TABLET | SUBLINGUAL | 3 refills | Status: AC | PRN

## 2020-06-14 NOTE — Progress Notes (Signed)
Follow-up Outpatient Visit Date: 06/14/2020  Primary Care Provider: [facility] [place] [address]  Chief Complaint: Shortness of breath  HPI:  Derrick Gallegos is a 69 y.o. male with history of coronary artery disease with reported three-vessel CABG at [facility] in 2004 and subsequent PCI x2 to the LCx in 07/2018, atrial flutter status post TEE guided cardioversion in 04/2018, hypertension, hyperlipidemia with statin intolerance, type 2 diabetes mellitus, lung cancer status post chemotherapy and radiation, and prior tobacco use, who presents for follow-up of coronary artery disease, atrial flutter, and hyperlipidemia.  I last spoke with Derrick Gallegos in 03/2019 via virtual visit.  He was feeling well at that time.  He remained on rivaroxaban and clopidogrel without significant bleeding.  He was tolerating ezetimibe well.  He has been intolerant of multiple statins and unable to afford PCSK9 inhibitors in the past.  Today, Derrick Gallegos complains primarily of shortness of breath.  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  He began immunotherapy a few months ago.  Shortly thereafter, he began to experience worsening exertional dyspnea.  He can now walk only 20 feet before needing to catch his breath.  He also complains of two-pillow orthopnea, though this predates his immunotherapy.  He notices occasional PND as well.  He has not had much chest pain, other than a single episode of severe "indigestion involving the epigastrium and lower chest last week.  It resolved on its own.  He notes that he has had some issues with inflammation of the esophagus after radiation therapy.  He denies edema and palpitations.  Derrick Gallegos has remained compliant with his medications, including rivaroxaban.  He does not have any significant bleeding.  He is concerned because immunotherapy has caused severe myalgias,  reminiscent of what he experienced with statins in the past.  He is tolerating ezetimibe well and notes that he has not had his lipids checked recently.  --------------------------------------------------------------------------------------------------  Past Medical History:  Diagnosis Date  . Anxiety   . Arthritis   . Atrial flutter ([place])    a. diagnosed 5/19; b. s/p TEE/DCCV 05/2018 by Dr. Humphrey Rolls; c. CHADS2VASc => 4 (HTN, age x1, DM, vascular disease)  . Complication of anesthesia    "wakes up too early"  . Coronary artery disease    a. s/p 3-v cabg in 2004 at [facility] (LIMA and radial arery used - no further details in Care Everywhere); b. LHC 8/19 underlying multi-V dz, patent LIMA-LAD, occluded VG-PDA, 90-95% stenosis of LCx s/p PCI/DES x 2  . Cough   . Depression   . Diabetes mellitus with complication ([place])   . Dyspnea   . Dysrhythmia    atrial fib.  . Essential hypertension   . Family history of premature CAD    a. father passed away from an MI at 67  . GERD (gastroesophageal reflux disease)   . Heart murmur   . HLD (hyperlipidemia)    a. statin intolerant   . Lung cancer ([place]) 09/2019   Chemo + Rad tx's.   . Mass of left lung   . MI (myocardial infarction) ([place])    2004  . Obesity    Past Surgical History:  Procedure Laterality Date  . CARDIAC CATHETERIZATION  2004  . CARDIAC CATHETERIZATION  2019   x2 stents [facility]  . CARDIAC SURGERY    . CARDIOVERSION N/A 06/20/2018   Procedure: CARDIOVERSION;  Surgeon: Dionisio David, MD;  Location: [facility] ORS;  Service: Cardiovascular;  Laterality: N/A;  . CORONARY ARTERY BYPASS GRAFT    . CORONARY STENT INTERVENTION N/A 08/08/2018   Procedure: CORONARY STENT INTERVENTION;  Surgeon: Isaias Cowman, MD;  Location: [facility];  Service: Cardiovascular;  Laterality: N/A;  . LEFT HEART CATH AND CORONARY ANGIOGRAPHY Left 08/08/2018   Procedure: LEFT HEART CATH AND CORONARY ANGIOGRAPHY;  Surgeon: Dionisio David, MD;  Location:  [facility];  Service: Cardiovascular;  Laterality: Left;  . PORTA CATH INSERTION N/A 11/10/2019   Procedure: PORTA CATH INSERTION;  Surgeon: Algernon Huxley, MD;  Location: [facility];  Service: Cardiovascular;  Laterality: N/A;  . TEE WITHOUT CARDIOVERSION N/A 06/20/2018   Procedure: TRANSESOPHAGEAL ECHOCARDIOGRAM (TEE);  Surgeon: Dionisio David, MD;  Location: [facility] ORS;  Service: Cardiovascular;  Laterality: N/A;  . VIDEO BRONCHOSCOPY N/A 10/29/2019   Procedure: VIDEO BRONCHOSCOPY WITH ENDOBRONCHICAL ULTRASOUND;  Surgeon: Tyler Pita, MD;  Location: [facility] ORS;  Service: Cardiopulmonary;  Laterality: N/A;    Current Meds  Medication Sig  . acetaminophen (TYLENOL) 500 MG tablet Take 1,000 mg by mouth every 6 (six) hours as needed for moderate pain.   Marland Kitchen amoxicillin-clavulanate (AUGMENTIN) 875-125 MG tablet Take 1 tablet by mouth 2 (two) times daily.  . empagliflozin (JARDIANCE) 25 MG TABS tablet Take 25 mg by mouth daily.  Marland Kitchen ezetimibe (ZETIA) 10 MG tablet Take 1 tablet (10 mg total) by mouth daily.  Marland Kitchen gabapentin (NEURONTIN) 300 MG capsule Take 1 capsule (300 mg total) by mouth 3 (three) times daily.  Marland Kitchen glipiZIDE (GLUCOTROL) 10 MG tablet Take 10 mg by mouth daily before breakfast.   . Glycopyrrolate-Formoterol (BEVESPI AEROSPHERE) 9-4.8 MCG/ACT AERO Inhale 2 puffs into the lungs 2 (two) times daily.  Marland Kitchen HYDROcodone-homatropine (HYCODAN) 5-1.5 MG/5ML syrup Take 5 mLs by mouth every 6 (six) hours as needed for cough.  Marland Kitchen LORazepam (ATIVAN) 0.5 MG tablet Take 1 tablet (0.5 mg total) by mouth 2 (two) times daily as needed for anxiety.  Marland Kitchen losartan (COZAAR) 50 MG tablet Take 50 mg by mouth daily.   . methimazole (TAPAZOLE) 10 MG tablet Take 10 mg by mouth in the morning and at bedtime.  . metoprolol tartrate (LOPRESSOR) 25 MG tablet Take 1 tablet (25 mg total) by mouth 2 (two) times daily. (Patient taking differently: Take 25 mg by mouth 1 day or 1 dose. )  . morphine (ROXANOL) 20  MG/ML concentrated solution Take 1 mL (20 mg total) by mouth every 4 (four) hours as needed for severe pain.  . Omega-3 Fatty Acids (FISH OIL) 1200 MG CAPS Take 2,400 mg by mouth at bedtime.   Marland Kitchen omeprazole (PRILOSEC) 20 MG capsule Take 1 capsule by mouth as needed.  . predniSONE (DELTASONE) 20 MG tablet Take 2 tablets (40 mg total) by mouth daily.  . rivaroxaban (XARELTO) 20 MG TABS tablet Take 1 tablet (20 mg total) by mouth daily with supper.  . sertraline (ZOLOFT) 25 MG tablet Take 1 tablet (25 mg total) by mouth daily.    Allergies: Patient has no known allergies.  Social History   Tobacco Use  . Smoking status: Former Smoker    Types: Cigarettes    Quit date: 2004    Years since quitting: 17.4  . Smokeless tobacco: Current User  . Tobacco comment: Quit 2004  Vaping Use  . Vaping Use: Never used  Substance Use Topics  . Alcohol use: No  .  Drug use: No    Family History  Problem Relation Age of Onset  . CAD Mother   . CAD Father        a. MI at 57-->death  . CAD Brother     Review of Systems: A 12-system review of systems was performed and was negative except as noted in the HPI.  --------------------------------------------------------------------------------------------------  Physical Exam: BP 132/74 (BP Location: Left Arm, Patient Position: Sitting, Cuff Size: Normal)   Pulse 75   Ht 5\' 11"  (1.803 m)   Wt 240 lb 8 oz (109.1 kg)   SpO2 97%   BMI 33.54 kg/m   General: NAD.  Accompanied by his daughter. Neck: No JVD or HJR. Lungs: Coarse breath sounds bilaterally with scattered rhonchi.  No wheezes or crackles. Heart: Regular rate and rhythm with S4 present.  No murmurs or rubs.  Well-healed median sternotomy scar present. Abdomen: Soft, nontender, nondistended. Extremities: No lower extremity edema.  EKG: Normal sinus rhythm with right bundle branch block and inferior Q waves.  No significant change from prior tracing on 10/09/2019.  Lab Results    Component Value Date   WBC 6.1 05/21/2020   HGB 14.4 05/21/2020   HCT 43.1 05/21/2020   MCV 84.7 05/21/2020   PLT 258 05/21/2020    Lab Results  Component Value Date   NA 134 (L) 05/21/2020   K 3.8 05/21/2020   CL 102 05/21/2020   CO2 23 05/21/2020   BUN 19 05/21/2020   CREATININE 0.71 05/21/2020   GLUCOSE 152 (H) 05/21/2020   ALT 14 05/21/2020    Lab Results  Component Value Date   CHOL 287 (H) 12/23/2018   HDL 41 12/23/2018   LDLCALC Comment 12/23/2018   LDLDIRECT 163 (H) 12/23/2018   TRIG 448 (H) 12/23/2018   CHOLHDL 7.0 (H) 12/23/2018    --------------------------------------------------------------------------------------------------  ASSESSMENT AND PLAN: Coronary artery disease with chest pain and shortness of breath: Derrick Gallegos has a history of extensive coronary artery disease status post remote CABG and more recent PCI approximately 2 years ago.  Temporally, his recent shortness of breath coincides with his lung cancer diagnosis and initiation of immunotherapy.  CTA of the chest was notable for development of right pleural effusion.  We have agreed to obtain a transthoracic echocardiogram for further evaluation.  If LVEF is preserved without regional wall motion abnormality, we will then order a pharmacologic myocardial perfusion stress test.  If there is evidence of cardiomyopathy, we would need to consider cardiac catheterization instead.  In the meantime, we will continue current medications, including ezetimibe and rivaroxaban, which has been used in lieu of aspirin in the setting of atrial flutter.  We will continue metoprolol for antianginal therapy.  I have also provided Derrick Gallegos with a prescription for sublingual nitroglycerin to be used as needed for chest pain.  Atrial flutter: No symptoms to suggest recurrence.  EKG today shows sinus rhythm.  Continue indefinite anticoagulation with rivaroxaban as well as current dose of metoprolol.  Squamous cell  carcinoma of the lung: Continue therapy and monitoring per Dr. Janese Banks.  Hyperlipidemia: Derrick Gallegos has been intolerant of multiple statins in the past.  He is currently on ezetimibe with no recent lipid panel.  We will perform a fasting lipid panel with his next blood draw later this month.  If LDL remains above 70, we will need to readdress PCSK9 inhibitor therapy.  He may be able to afford it or receive medication assistance at this time.  Follow-up: Return  to clinic in 6 weeks.  [name], MD 06/14/2020 1:59 PM

## 2020-06-14 NOTE — Patient Instructions (Signed)
Medication Instructions:  Your physician recommends that you continue on your current medications as directed. Please refer to the Current Medication list given to you today.  Nitroglycerin as needed for chest pain - Dissolve 1 tablet (0.4 mg) under your tongue every 5 minutes as needed for chest pain. Do not take more than 3 doses. If chest pain does not resolve, then call 911 or go to the Emergency Room.    *If you need a refill on your cardiac medications before your next appointment, please call your pharmacy*   Lab Work: Your physician recommends that you return for lab work in: next week at the [facility] ([place]) if possible. - LIPID - You will need to be fasting. Please do not have anything to eat or drink after midnight the morning you have the lab work. You may only have water or black coffee with no cream or sugar. - Please go to the [facility]. You will check in at the front desk to the right as you walk into the atrium. Valet Parking is offered if needed. - No appointment needed. You may go any day between 7 am and 6 pm.   If you have labs (blood work) drawn today and your tests are completely normal, you will receive your results only by: Marland Kitchen MyChart Message (if you have MyChart) OR . A paper copy in the mail If you have any lab test that is abnormal or we need to change your treatment, we will call you to review the results.   Testing/Procedures: Your physician has requested that you have an echocardiogram. Echocardiography is a painless test that uses sound waves to create images of your heart. It provides your doctor with information about the size and shape of your heart and how well your heart's chambers and valves are working. This procedure takes approximately one hour. There are no restrictions for this procedure. You may get an IV, if needed, to receive an ultrasound enhancing agent through to better visualize your heart.    Follow-Up: At [facility], you and  your health needs are our priority.  As part of our continuing mission to provide you with exceptional heart care, we have created designated Provider Care Teams.  These Care Teams include your primary Cardiologist (physician) and Advanced Practice Providers (APPs -  Physician Assistants and Nurse Practitioners) who all work together to provide you with the care you need, when you need it.  We recommend signing up for the patient portal called "MyChart".  Sign up information is provided on this After Visit Summary.  MyChart is used to connect with patients for Virtual Visits (Telemedicine).  Patients are able to view lab/test results, encounter notes, upcoming appointments, etc.  Non-urgent messages can be sent to your provider as well.   To learn more about what you can do with MyChart, go to [email or web address].    Your next appointment:   6 week(s)  The format for your next appointment:   In Person  Provider:    You may see DR Harrell Gave END or one of the following Advanced Practice Providers on your designated Care Team:    Murray Hodgkins, NP  Christell Faith, PA-C  Marrianne Mood, PA-C

## 2020-06-15 ENCOUNTER — Other Ambulatory Visit: Payer: Self-pay | Admitting: *Deleted

## 2020-06-15 DIAGNOSIS — E7800 Pure hypercholesterolemia, unspecified: Secondary | ICD-10-CM

## 2020-06-18 ENCOUNTER — Ambulatory Visit: Payer: Medicare Other | Admitting: Radiation Oncology

## 2020-06-22 ENCOUNTER — Encounter: Payer: Self-pay | Admitting: Oncology

## 2020-06-22 ENCOUNTER — Inpatient Hospital Stay (HOSPITAL_BASED_OUTPATIENT_CLINIC_OR_DEPARTMENT_OTHER): Payer: Medicare Other | Admitting: Oncology

## 2020-06-22 ENCOUNTER — Inpatient Hospital Stay: Payer: Medicare Other | Attending: Oncology

## 2020-06-22 ENCOUNTER — Other Ambulatory Visit: Payer: Self-pay

## 2020-06-22 VITALS — BP 123/80 | HR 89 | Temp 96.3°F | Resp 16 | Wt 237.5 lb

## 2020-06-22 DIAGNOSIS — I252 Old myocardial infarction: Secondary | ICD-10-CM | POA: Insufficient documentation

## 2020-06-22 DIAGNOSIS — F419 Anxiety disorder, unspecified: Secondary | ICD-10-CM

## 2020-06-22 DIAGNOSIS — K5900 Constipation, unspecified: Secondary | ICD-10-CM | POA: Diagnosis not present

## 2020-06-22 DIAGNOSIS — M1990 Unspecified osteoarthritis, unspecified site: Secondary | ICD-10-CM | POA: Diagnosis not present

## 2020-06-22 DIAGNOSIS — K219 Gastro-esophageal reflux disease without esophagitis: Secondary | ICD-10-CM | POA: Diagnosis not present

## 2020-06-22 DIAGNOSIS — E0590 Thyrotoxicosis, unspecified without thyrotoxic crisis or storm: Secondary | ICD-10-CM

## 2020-06-22 DIAGNOSIS — R05 Cough: Secondary | ICD-10-CM | POA: Insufficient documentation

## 2020-06-22 DIAGNOSIS — I2510 Atherosclerotic heart disease of native coronary artery without angina pectoris: Secondary | ICD-10-CM

## 2020-06-22 DIAGNOSIS — Z79899 Other long term (current) drug therapy: Secondary | ICD-10-CM | POA: Diagnosis not present

## 2020-06-22 DIAGNOSIS — Z87891 Personal history of nicotine dependence: Secondary | ICD-10-CM | POA: Insufficient documentation

## 2020-06-22 DIAGNOSIS — E785 Hyperlipidemia, unspecified: Secondary | ICD-10-CM | POA: Insufficient documentation

## 2020-06-22 DIAGNOSIS — E1159 Type 2 diabetes mellitus with other circulatory complications: Secondary | ICD-10-CM | POA: Diagnosis not present

## 2020-06-22 DIAGNOSIS — Z923 Personal history of irradiation: Secondary | ICD-10-CM | POA: Insufficient documentation

## 2020-06-22 DIAGNOSIS — F418 Other specified anxiety disorders: Secondary | ICD-10-CM | POA: Insufficient documentation

## 2020-06-22 DIAGNOSIS — Z7901 Long term (current) use of anticoagulants: Secondary | ICD-10-CM | POA: Diagnosis not present

## 2020-06-22 DIAGNOSIS — E7800 Pure hypercholesterolemia, unspecified: Secondary | ICD-10-CM

## 2020-06-22 DIAGNOSIS — R110 Nausea: Secondary | ICD-10-CM | POA: Insufficient documentation

## 2020-06-22 DIAGNOSIS — C3412 Malignant neoplasm of upper lobe, left bronchus or lung: Secondary | ICD-10-CM

## 2020-06-22 DIAGNOSIS — Z452 Encounter for adjustment and management of vascular access device: Secondary | ICD-10-CM | POA: Diagnosis not present

## 2020-06-22 DIAGNOSIS — Z7952 Long term (current) use of systemic steroids: Secondary | ICD-10-CM | POA: Insufficient documentation

## 2020-06-22 DIAGNOSIS — R0602 Shortness of breath: Secondary | ICD-10-CM | POA: Diagnosis not present

## 2020-06-22 DIAGNOSIS — I10 Essential (primary) hypertension: Secondary | ICD-10-CM | POA: Insufficient documentation

## 2020-06-22 DIAGNOSIS — R5381 Other malaise: Secondary | ICD-10-CM | POA: Diagnosis not present

## 2020-06-22 DIAGNOSIS — Z9221 Personal history of antineoplastic chemotherapy: Secondary | ICD-10-CM | POA: Insufficient documentation

## 2020-06-22 DIAGNOSIS — Z955 Presence of coronary angioplasty implant and graft: Secondary | ICD-10-CM | POA: Insufficient documentation

## 2020-06-22 LAB — COMPREHENSIVE METABOLIC PANEL
ALT: 12 U/L (ref 0–44)
AST: 14 U/L — ABNORMAL LOW (ref 15–41)
Albumin: 4.1 g/dL (ref 3.5–5.0)
Alkaline Phosphatase: 83 U/L (ref 38–126)
Anion gap: 11 (ref 5–15)
BUN: 18 mg/dL (ref 8–23)
CO2: 24 mmol/L (ref 22–32)
Calcium: 9.1 mg/dL (ref 8.9–10.3)
Chloride: 98 mmol/L (ref 98–111)
Creatinine, Ser: 0.81 mg/dL (ref 0.61–1.24)
GFR calc Af Amer: >60 mL/min (ref >60)
GFR calc non Af Amer: >60 mL/min (ref >60)
Glucose, Bld: 182 mg/dL — ABNORMAL HIGH (ref 70–99)
Potassium: 4 mmol/L (ref 3.5–5.1)
Sodium: 133 mmol/L — ABNORMAL LOW (ref 135–145)
Total Bilirubin: 0.8 mg/dL (ref 0.3–1.2)
Total Protein: 8 g/dL (ref 6.5–8.1)

## 2020-06-22 LAB — CBC WITH DIFFERENTIAL/PLATELET
Abs Immature Granulocytes: 0.05 10*3/uL (ref 0.00–0.07)
Basophils Absolute: 0 10*3/uL (ref 0.0–0.1)
Basophils Relative: 1 %
Eosinophils Absolute: 0.2 10*3/uL (ref 0.0–0.5)
Eosinophils Relative: 2 %
HCT: 42.3 % (ref 39.0–52.0)
Hemoglobin: 14.4 g/dL (ref 13.0–17.0)
Immature Granulocytes: 1 %
Lymphocytes Relative: 9 %
Lymphs Abs: 0.8 10*3/uL (ref 0.7–4.0)
MCH: 28.3 pg (ref 26.0–34.0)
MCHC: 34 g/dL (ref 30.0–36.0)
MCV: 83.3 fL (ref 80.0–100.0)
Monocytes Absolute: 0.8 10*3/uL (ref 0.1–1.0)
Monocytes Relative: 9 %
Neutro Abs: 6.7 10*3/uL (ref 1.7–7.7)
Neutrophils Relative %: 78 %
Platelets: 298 10*3/uL (ref 150–400)
RBC: 5.08 MIL/uL (ref 4.22–5.81)
RDW: 15.4 % (ref 11.5–15.5)
WBC: 8.5 10*3/uL (ref 4.0–10.5)
nRBC: 0 % (ref 0.0–0.2)

## 2020-06-22 LAB — LIPID PANEL
Cholesterol: 193 mg/dL (ref 0–200)
HDL: 44 mg/dL (ref >40)
LDL Cholesterol: 123 mg/dL — ABNORMAL HIGH (ref 0–99)
Total CHOL/HDL Ratio: 4.4 RATIO
Triglycerides: 131 mg/dL (ref <150)
VLDL: 26 mg/dL (ref 0–40)

## 2020-06-22 MED ORDER — HEPARIN SOD (PORK) LOCK FLUSH 100 UNIT/ML IV SOLN
INTRAVENOUS | Status: AC
  Filled 2020-06-22: qty 5

## 2020-06-22 MED ORDER — SODIUM CHLORIDE 0.9% FLUSH
10.0000 mL | INTRAVENOUS | Status: AC | PRN
  Administered 2020-06-22: 10 mL via INTRAVENOUS
  Filled 2020-06-22: qty 10

## 2020-06-22 MED ORDER — HEPARIN SOD (PORK) LOCK FLUSH 100 UNIT/ML IV SOLN
500.0000 [IU] | Freq: Once | INTRAVENOUS | Status: AC
  Administered 2020-06-22: 500 [IU] via INTRAVENOUS
  Filled 2020-06-22: qty 5

## 2020-06-22 NOTE — Progress Notes (Signed)
Hematology/Oncology Consult note [facility]  [phone] [phone]  Patient Care Team: [facility], [facility] as PCP - General ([place]) Telford Nab, RN as Registered Nurse   Name of the patient: Derrick Gallegos  [id number]  January 07, 1951   Date of visit: 06/22/20  Diagnosis- Stage IIIC SCC lung [id number]  Chief complaint/ Reason for visit-routine follow-up of lung cancer  Heme/Onc history: patient is a 69 year old malewho was a former smoker and smoked about 2 to 3 packs/day for many years and quit smoking a few years ago. He presented to the ER with symptoms of cough and shortness of breathWhich prompted [place] chest x-ray followed by a CT scan. CT scan showed a 3.4 x 3.2 cm left perihilar mass along the left paratracheal precarinal and subcarinal vascular and left hilar adenopathy. Patient has been referred for further management.  PET CT scan showed a large left upper lobe lung mass measuring 6.3 x 3.4 cm. Bilateral mediastinal adenopathy as well as left supraclavicular adenopathy. 3.4 cm partially exophytic lesion of the right kidney. No evidence of distant metastatic disease.  Patient has completed concurrent chemo/RT. Scans showe dpartial response. Maintenance durvalumab started in feb 2021  Patient has met with Dr. Erlene Quan for right renal mass which is being monitored for now and consideration for cryoablation in the future  Patienta will have multiple symptoms including anxiety fatigue and problems with balance after starting maintenance durvalumab which has been on hold since 03/26/2020.  He was also diagnosed with hyperthyroidism and follows up with endocrinology and is currently on methimazole.  Interval history-continues to feel poorly.  Reports multiple complaints including generalized fatigue.  States that his whole body hurts.  Reports feeling short of breath even when he walks short distances.   Reports ongoing cough which is especially worse at night.  Occasional nausea and constipation.  States Ativan is not helping him any longer for his anxiety.  ECOG PS- 1 Pain scale- 6   Review of systems- Review of Systems  Constitutional: Positive for malaise/fatigue. Negative for chills, fever and weight loss.  HENT: Negative for congestion, ear discharge and nosebleeds.   Eyes: Negative for blurred vision.  Respiratory: Positive for cough and shortness of breath. Negative for hemoptysis, sputum production and wheezing.   Cardiovascular: Negative for chest pain, palpitations, orthopnea and claudication.  Gastrointestinal: Positive for nausea. Negative for abdominal pain, blood in stool, constipation, diarrhea, heartburn, melena and vomiting.  Genitourinary: Negative for dysuria, flank pain, frequency, hematuria and urgency.  Musculoskeletal: Negative for back pain, joint pain and myalgias.       Diffuse aches and pains all over  Skin: Negative for rash.  Neurological: Negative for dizziness, tingling, focal weakness, seizures, weakness and headaches.  Endo/Heme/Allergies: Does not bruise/bleed easily.  Psychiatric/Behavioral: Negative for depression and suicidal ideas. The patient does not have insomnia.      No Known Allergies   Past Medical History:  Diagnosis Date  . Anxiety   . Arthritis   . Atrial flutter ([place])    a. diagnosed 5/19; b. s/p TEE/DCCV 05/2018 by Dr. Humphrey Rolls; c. CHADS2VASc => 4 (HTN, age x1, DM, vascular disease)  . Complication of anesthesia    "wakes up too early"  . Coronary artery disease    a. s/p 3-v cabg in 2004 at [place] (LIMA and radial arery used - no further details in Care Everywhere); b. LHC 8/19 underlying multi-V dz, patent LIMA-LAD, occluded VG-PDA, 90-95% stenosis of LCx s/p PCI/DES  x 2  . Cough   . Depression   . Diabetes mellitus with complication ([place])   . Dyspnea   . Dysrhythmia    atrial fib.  . Essential hypertension   . Family history of  premature CAD    a. father passed away from an MI at 30  . GERD (gastroesophageal reflux disease)   . Heart murmur   . HLD (hyperlipidemia)    a. statin intolerant   . Lung cancer ([place]) 09/2019   Chemo + Rad tx's.   . Mass of left lung   . MI (myocardial infarction) ([place])    2004  . Obesity      Past Surgical History:  Procedure Laterality Date  . CARDIAC CATHETERIZATION  2004  . CARDIAC CATHETERIZATION  2019   x2 stents [facility]  . CARDIAC SURGERY    . CARDIOVERSION N/A 06/20/2018   Procedure: CARDIOVERSION;  Surgeon: Dionisio David, MD;  Location: [facility] ORS;  Service: Cardiovascular;  Laterality: N/A;  . CORONARY ARTERY BYPASS GRAFT    . CORONARY STENT INTERVENTION N/A 08/08/2018   Procedure: CORONARY STENT INTERVENTION;  Surgeon: Isaias Cowman, MD;  Location: [place] [facility];  Service: Cardiovascular;  Laterality: N/A;  . LEFT HEART CATH AND CORONARY ANGIOGRAPHY Left 08/08/2018   Procedure: LEFT HEART CATH AND CORONARY ANGIOGRAPHY;  Surgeon: Dionisio David, MD;  Location: [place] [facility];  Service: Cardiovascular;  Laterality: Left;  . PORTA CATH INSERTION N/A 11/10/2019   Procedure: PORTA CATH INSERTION;  Surgeon: Algernon Huxley, MD;  Location: [place] [facility];  Service: Cardiovascular;  Laterality: N/A;  . TEE WITHOUT CARDIOVERSION N/A 06/20/2018   Procedure: TRANSESOPHAGEAL ECHOCARDIOGRAM (TEE);  Surgeon: Dionisio David, MD;  Location: [facility] ORS;  Service: Cardiovascular;  Laterality: N/A;  . VIDEO BRONCHOSCOPY N/A 10/29/2019   Procedure: VIDEO BRONCHOSCOPY WITH ENDOBRONCHICAL ULTRASOUND;  Surgeon: Tyler Pita, MD;  Location: [facility] ORS;  Service: Cardiopulmonary;  Laterality: N/A;    Social History   Socioeconomic History  . Marital status: Married    Spouse name: Not on file  . Number of children: Not on file  . Years of education: Not on file  . Highest education level: Not on file  Occupational History  . Not on file  Tobacco Use  . Smoking  status: Former Smoker    Types: Cigarettes    Quit date: 2004    Years since quitting: 17.5  . Smokeless tobacco: Current User  . Tobacco comment: Quit 2004  Vaping Use  . Vaping Use: Never used  Substance and Sexual Activity  . Alcohol use: No  . Drug use: No  . Sexual activity: Not Currently  Other Topics Concern  . Not on file  Social History Narrative  . Not on file   Social Determinants of Health   Financial Resource Strain:   . Difficulty of Paying Living Expenses:   Food Insecurity:   . Worried About Charity fundraiser in the Last Year:   . Arboriculturist in the Last Year:   Transportation Needs:   . Film/video editor (Medical):   Marland Kitchen Lack of Transportation (Non-Medical):   Physical Activity:   . Days of Exercise per Week:   . Minutes of Exercise per Session:   Stress:   . Feeling of Stress :   Social Connections:   . Frequency of Communication with Friends and Family:   . Frequency of Social Gatherings with Friends and Family:   .  Attends Religious Services:   . Active Member of Clubs or Organizations:   . Attends Archivist Meetings:   Marland Kitchen Marital Status:   Intimate Partner Violence:   . Fear of Current or Ex-Partner:   . Emotionally Abused:   Marland Kitchen Physically Abused:   . Sexually Abused:     Family History  Problem Relation Age of Onset  . CAD Mother   . CAD Father        a. MI at 57-->death  . CAD Brother      Current Outpatient Medications:  .  acetaminophen (TYLENOL) 500 MG tablet, Take 1,000 mg by mouth every 6 (six) hours as needed for moderate pain. , Disp: , Rfl:  .  amoxicillin-clavulanate (AUGMENTIN) 875-125 MG tablet, Take 1 tablet by mouth 2 (two) times daily., Disp: 14 tablet, Rfl: 0 .  empagliflozin (JARDIANCE) 25 MG TABS tablet, Take 25 mg by mouth daily., Disp: , Rfl:  .  ezetimibe (ZETIA) 10 MG tablet, Take 1 tablet (10 mg total) by mouth daily., Disp: 30 tablet, Rfl: 3 .  gabapentin (NEURONTIN) 300 MG capsule, Take 1  capsule (300 mg total) by mouth 3 (three) times daily., Disp: 90 capsule, Rfl: 0 .  glipiZIDE (GLUCOTROL) 10 MG tablet, Take 10 mg by mouth daily before breakfast. , Disp: , Rfl:  .  Glycopyrrolate-Formoterol (BEVESPI AEROSPHERE) 9-4.8 MCG/ACT AERO, Inhale 2 puffs into the lungs 2 (two) times daily., Disp: 10.7 g, Rfl: 11 .  HYDROcodone-homatropine (HYCODAN) 5-1.5 MG/5ML syrup, Take 5 mLs by mouth every 6 (six) hours as needed for cough., Disp: 120 mL, Rfl: 0 .  LORazepam (ATIVAN) 0.5 MG tablet, Take 1 tablet (0.5 mg total) by mouth 2 (two) times daily as needed for anxiety., Disp: 60 tablet, Rfl: 0 .  losartan (COZAAR) 50 MG tablet, Take 50 mg by mouth daily. , Disp: , Rfl:  .  methimazole (TAPAZOLE) 10 MG tablet, Take 10 mg by mouth in the morning and at bedtime., Disp: , Rfl:  .  metoprolol tartrate (LOPRESSOR) 25 MG tablet, Take 1 tablet (25 mg total) by mouth 2 (two) times daily. (Patient taking differently: Take 25 mg by mouth 1 day or 1 dose. ), Disp: 180 tablet, Rfl: 0 .  morphine (ROXANOL) 20 MG/ML concentrated solution, Take 1 mL (20 mg total) by mouth every 4 (four) hours as needed for severe pain., Disp: 60 mL, Rfl: 0 .  nitroGLYCERIN (NITROSTAT) 0.4 MG SL tablet, Place 1 tablet (0.4 mg total) under the tongue every 5 (five) minutes as needed for chest pain. Maximum of 3 doses., Disp: 25 tablet, Rfl: 3 .  Omega-3 Fatty Acids (FISH OIL) 1200 MG CAPS, Take 2,400 mg by mouth at bedtime. , Disp: , Rfl:  .  omeprazole (PRILOSEC) 20 MG capsule, Take 1 capsule by mouth as needed., Disp: , Rfl:  .  predniSONE (DELTASONE) 20 MG tablet, Take 2 tablets (40 mg total) by mouth daily., Disp: 14 tablet, Rfl: 0 .  rivaroxaban (XARELTO) 20 MG TABS tablet, Take 1 tablet (20 mg total) by mouth daily with supper., Disp: 90 tablet, Rfl: 2 .  sertraline (ZOLOFT) 25 MG tablet, Take 1 tablet (25 mg total) by mouth daily., Disp: 90 tablet, Rfl: 0 No current facility-administered medications for this  visit.  Facility-Administered Medications Ordered in Other Visits:  .  sodium chloride flush (NS) 0.9 % injection 10 mL, 10 mL, Intravenous, PRN, Sindy Guadeloupe, MD, 10 mL at 06/22/20 5631  Physical exam:  Vitals:  06/22/20 0946  BP: 123/80  Pulse: 89  Resp: 16  Temp: (!) 96.3 F (35.7 C)  TempSrc: Tympanic  SpO2: 97%  Weight: 237 lb 8 oz (107.7 kg)   Physical Exam Constitutional:      General: He is not in acute distress. Cardiovascular:     Rate and Rhythm: Normal rate and regular rhythm.     Heart sounds: Normal heart sounds.  Pulmonary:     Effort: Pulmonary effort is normal.     Breath sounds: Normal breath sounds.  Abdominal:     General: Bowel sounds are normal.     Palpations: Abdomen is soft.  Skin:    General: Skin is warm and dry.  Neurological:     Mental Status: He is alert and oriented to person, place, and time.      CMP Latest Ref Rng & Units 06/22/2020  Glucose 70 - 99 mg/dL 182(H)  BUN 8 - 23 mg/dL 18  Creatinine 0.61 - 1.24 mg/dL 0.81  Sodium 135 - 145 mmol/L 133(L)  Potassium 3.5 - 5.1 mmol/L 4.0  Chloride 98 - 111 mmol/L 98  CO2 22 - 32 mmol/L 24  Calcium 8.9 - 10.3 mg/dL 9.1  Total Protein 6.5 - 8.1 g/dL 8.0  Total Bilirubin 0.3 - 1.2 mg/dL 0.8  Alkaline Phos 38 - 126 U/L 83  AST 15 - 41 U/L 14(L)  ALT 0 - 44 U/L 12   CBC Latest Ref Rng & Units 06/22/2020  WBC 4.0 - 10.5 K/uL 8.5  Hemoglobin 13.0 - 17.0 g/dL 14.4  Hematocrit 39 - 52 % 42.3  Platelets 150 - 400 K/uL 298     Assessment and plan- Patient is a 69 y.o. male with stage III lung cancer s/p concurrent chemoradiation and was on maintenance durvalumab which has been on hold since April 2021.  Patient is here for following issues:  1.  Shortness of breath: Etiology is unclear.  He recently underwent CT chest in May 2021 when he was having all these symptoms which did not show any overt evidence of pneumonitis.  He was also given prednisone for a week by pulmonary and says that it  did not help him at all.  He has been evaluated by cardiology and has been scheduled to undergo echocardiogram followed by possible stress test.He did have a recent CT chest with contrast which did not show any evidence of acute pathology such as pneumonitis.  Small left pleural effusion and decrease in the size of mediastinal adenopathy.  I spoke to [name] today and she will be seeing him in clinic soon to discuss her shortness of breath.  2.  Hyperthyroidism: Being followed by endocrinology and is currently on methimazole.  Dose was decreased recently and despite correcting his thyroid functions patient has not had any improvement in his overall symptoms  3.  Stage III lung cancer: Patient had some baseline hyperthyroidism even prior to starting durvalumab.  Immunotherapy typically causes autoimmune hypothyroidism not hyperthyroidism.  We will continue to hold durvalumab at this time  4.  Anxiety: I have asked the patient to try Zoloft in addition to as needed Ativan which he has been hesitant to try in the past  I will see him back in 3 months with CBC with differential and CMP.  Patient has multiple somatic symptoms and no clear etiology yet.   Visit Diagnosis 1. Malignant neoplasm of upper lobe of left lung ([place])   2. Hyperthyroidism   3. Shortness of breath  4. Anxiety      [name], MD, MPH [facility] [phone] 06/22/2020 9:23 AM

## 2020-06-24 ENCOUNTER — Other Ambulatory Visit: Payer: Self-pay | Admitting: Oncology

## 2020-06-24 ENCOUNTER — Telehealth: Payer: Self-pay | Admitting: *Deleted

## 2020-06-24 DIAGNOSIS — E785 Hyperlipidemia, unspecified: Secondary | ICD-10-CM

## 2020-06-24 NOTE — Telephone Encounter (Signed)
-----   Message from [name], MD sent at 06/24/2020 11:36 AM EDT ----- Please let Mr. Markwood know that his lipids are suboptimally controlled with an LDL of 123 (goal less than 70, given his history of CAD status post CABG).  Given that he has been intolerant of multiple statins in the past, I suggest that we refer him to see the pharmacy lipid clinic to discuss PCSK9 inhibitor therapy.  Hopefully, he will be able to qualify for medication assistance.

## 2020-06-24 NOTE — Telephone Encounter (Signed)
Results called to pt. Pt verbalized understanding. He is agreeable to Lipid Clinic referral. Placed and message sent to [facility] for scheduling.

## 2020-06-25 MED ORDER — SERTRALINE HCL 25 MG PO TABS: 25.0000 mg | ORAL_TABLET | Freq: Every day | ORAL | 0 refills | Status: DC

## 2020-06-30 ENCOUNTER — Other Ambulatory Visit: Payer: Self-pay | Admitting: Oncology

## 2020-06-30 IMAGING — CT CT CHEST W/ CM
2 of 4 series · 14 of 36 positions shown, 17 images · IV contrast (omnipaque)
Comparison: Chest CT 10/09/2019.  PET-CT 10/14/2019.

CLINICAL DATA: 68-year-old male with history of left upper lobe
lung cancer.

EXAM:
CT CHEST WITH CONTRAST
TECHNIQUE: Multidetector CT imaging of the chest was performed during
intravenous contrast administration.
CONTRAST:  75mL OMNIPAQUE IOHEXOL 300 MG/ML  SOLN

[Series 2: axial chest 2.00 · axial · 0.82mm/px · z∈[-1212,-906]mm · 11 of 181 slices shown, 14 images]
[im 14/181  mediastinal]
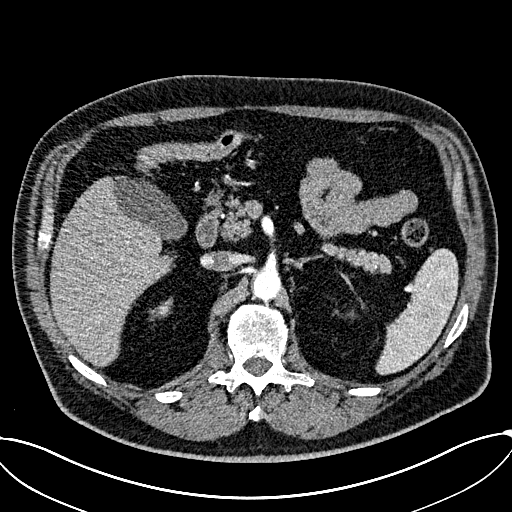
[im 14/181  lung]
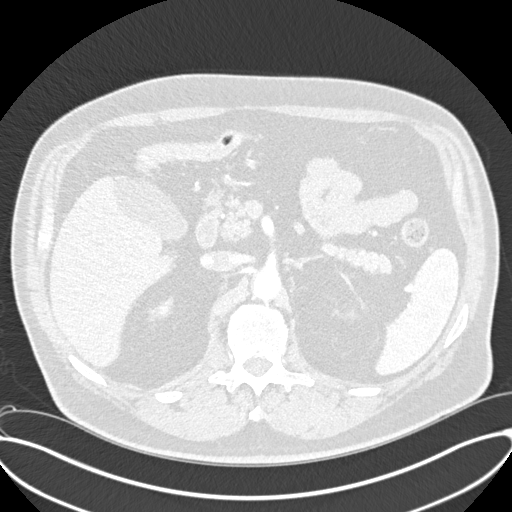
[im 28/181  lung]
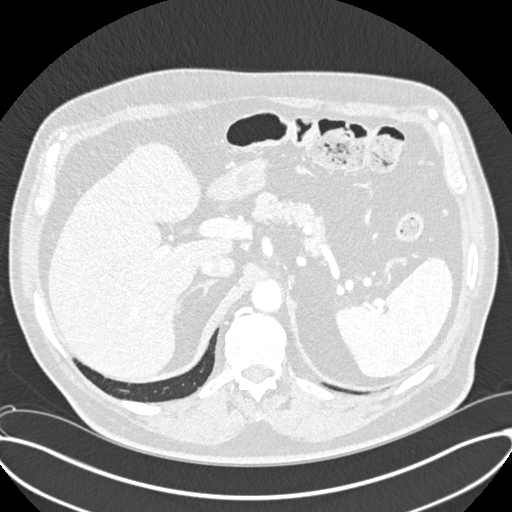
[im 42/181  lung]
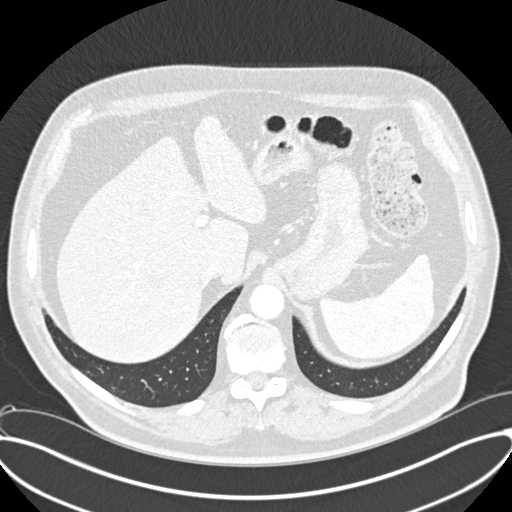
[im 56/181  lung]
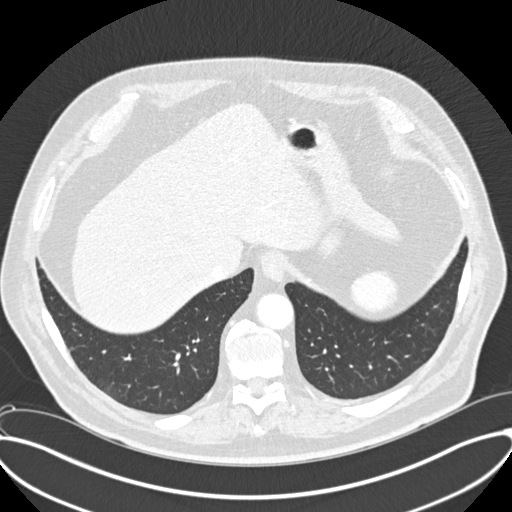
[im 70/181  mediastinal]
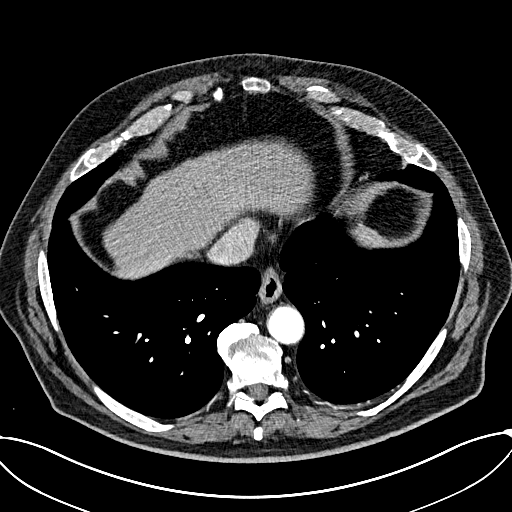
[im 70/181  lung]
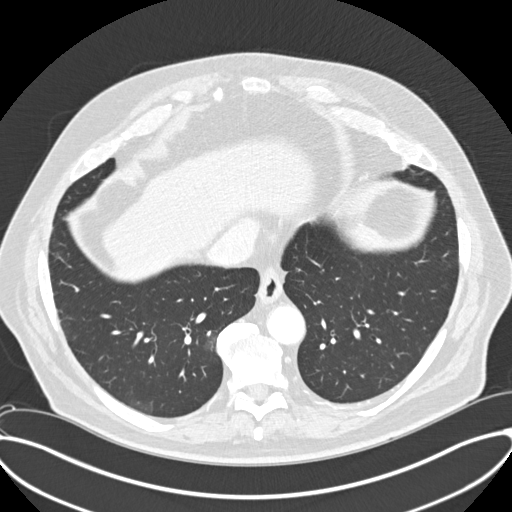
[im 97/181  lung]
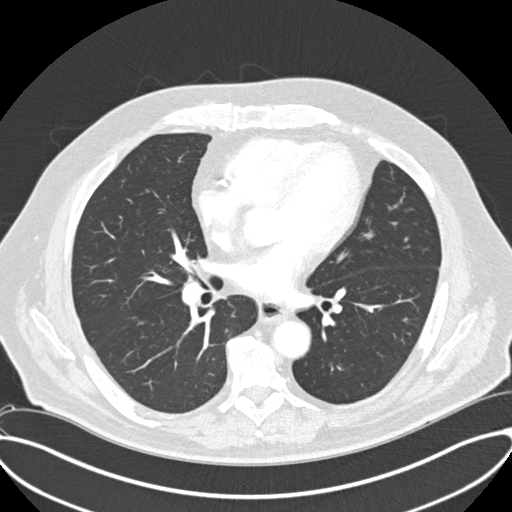
[im 111/181  lung]
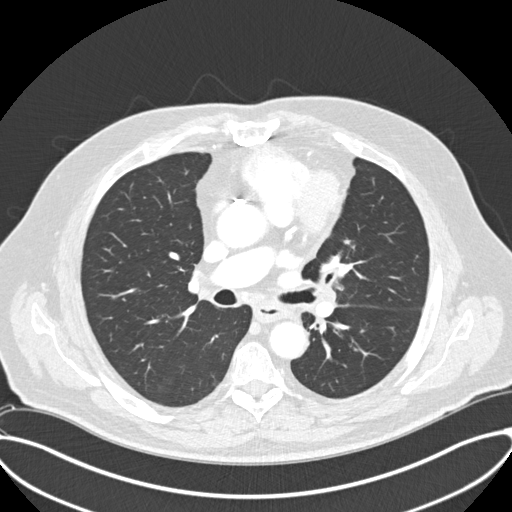
[im 125/181  lung]
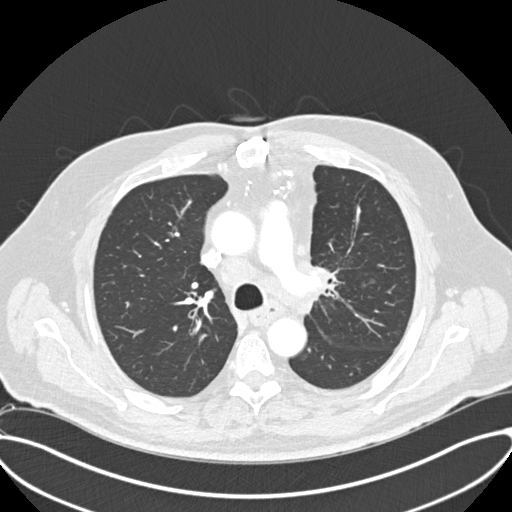
[im 139/181  mediastinal]
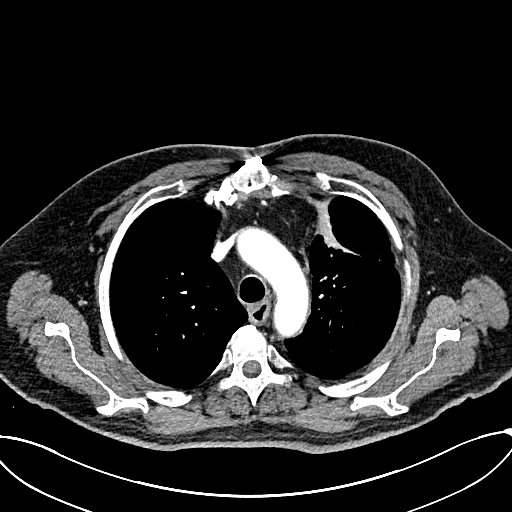
[im 139/181  lung]
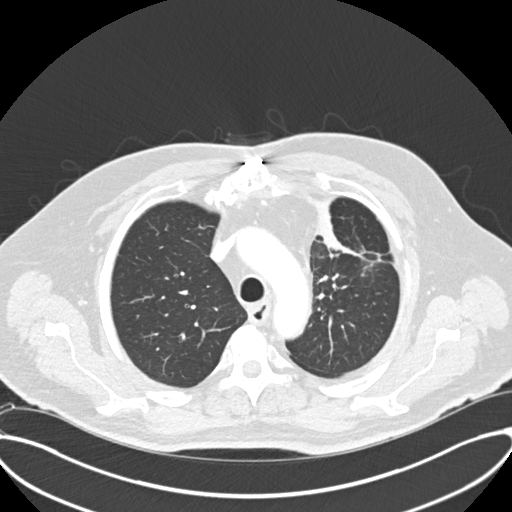
[im 153/181  lung]
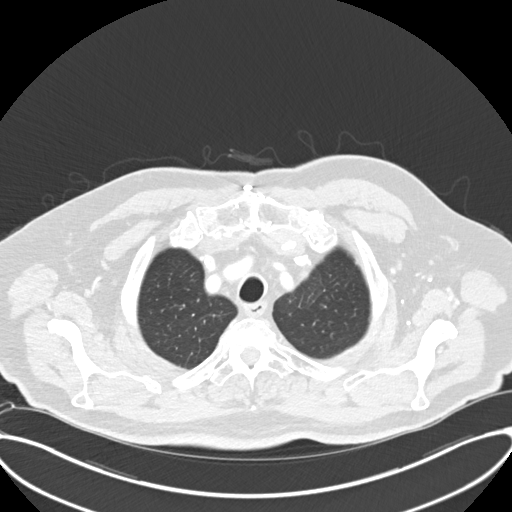
[im 167/181  lung]
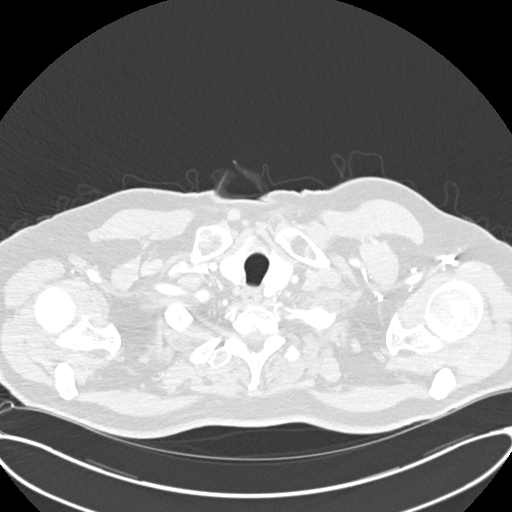

[Series 4: coronal chest 2.00 cor · coronal · 0.71mm/px · 3 of 172 slices shown]
[im 35/172  lung]
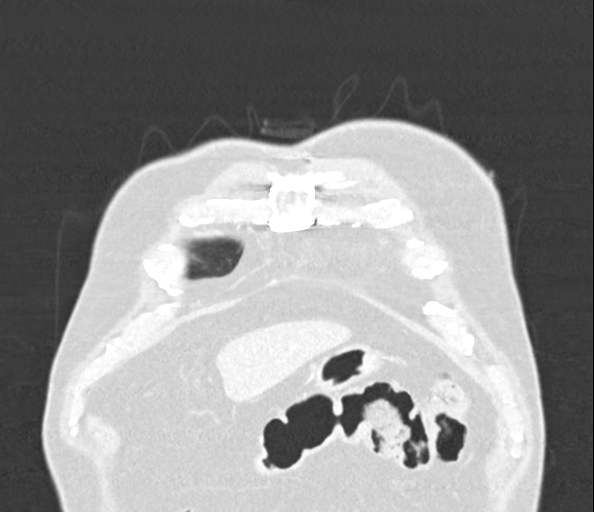
[im 69/172  lung]
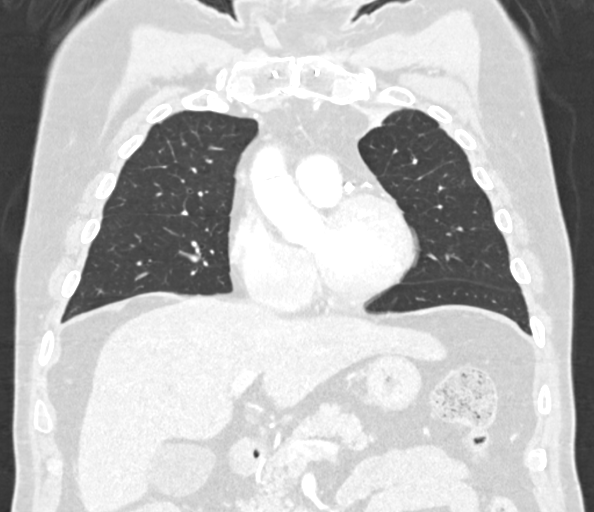
[im 103/172  lung]
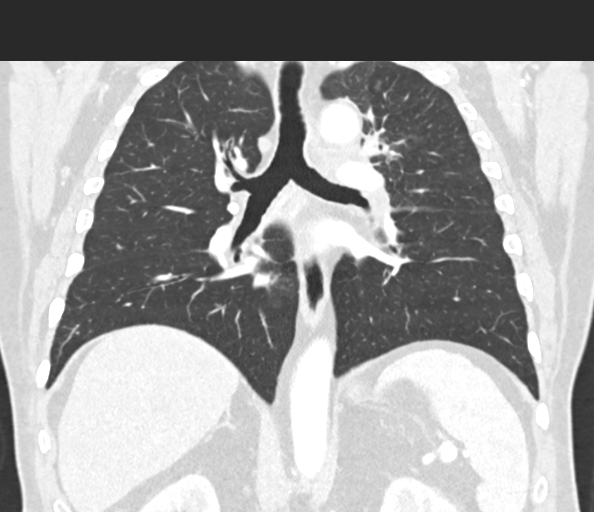

[14 of 36 positions shown; findings below may reference images not displayed]

FINDINGS: Cardiovascular: Heart size is normal. There is no significant
pericardial fluid, thickening or pericardial calcification. There is
aortic atherosclerosis, as well as atherosclerosis of the great
vessels of the mediastinum and the coronary arteries, including
calcified atherosclerotic plaque in the left main, left anterior
descending, left circumflex and right coronary arteries.

Mediastinum/Nodes: Multiple enlarged mediastinal and left hilar
lymph nodes are again noted, but decreased in size compared to the
prior examination. The largest residual left hilar lymph node
currently measures 1.4 cm in short axis. The largest mediastinal
lymph node is a right paratracheal lymph node measuring 2.2 cm in
short axis (previously 3.1 cm). Esophagus is unremarkable in
appearance. No axillary lymphadenopathy.

Lungs/Pleura: Regression of previously noted left upper lobe mass
which is currently in ill-defined area of soft tissue prominence and
architectural distortion in the suprahilar aspect of the left upper
lobe (axial image 51 of series 3) measuring approximately 2.4 x
cm. Postobstructive changes in the left upper lobe with areas of
atelectasis and patchy areas of ground-glass attenuation in the left
upper lobe distal to the lesion. No new suspicious appearing
pulmonary nodules or masses are noted. No pleural effusions.

Upper Abdomen: Aortic atherosclerosis.

Musculoskeletal: Median sternotomy wires. There are no aggressive
appearing lytic or blastic lesions noted in the visualized portions
of the skeleton.
IMPRESSION: 1. Today's study demonstrates a positive response to therapy with
partial regression of the previously noted left upper lobe mass as
well as partial regression of left hilar and mediastinal
lymphadenopathy, as detailed above.
2. Aortic atherosclerosis, in addition to left main and 3 vessel
coronary artery disease. Please note that although the presence of
coronary artery calcium documents the presence of coronary artery
disease, the severity of this disease and any potential stenosis
cannot be assessed on this non-gated CT examination. Assessment for
potential risk factor modification, dietary therapy or pharmacologic
therapy may be warranted, if clinically indicated.

Aortic Atherosclerosis ([id number].M).

## 2020-07-01 ENCOUNTER — Ambulatory Visit (INDEPENDENT_AMBULATORY_CARE_PROVIDER_SITE_OTHER): Payer: Medicare Other | Admitting: Pulmonary Disease

## 2020-07-01 ENCOUNTER — Encounter: Payer: Self-pay | Admitting: Pulmonary Disease

## 2020-07-01 ENCOUNTER — Other Ambulatory Visit: Payer: Self-pay

## 2020-07-01 VITALS — BP 128/72 | HR 80 | Temp 98.2°F | Ht 71.0 in | Wt 239.0 lb

## 2020-07-01 DIAGNOSIS — R0602 Shortness of breath: Secondary | ICD-10-CM

## 2020-07-01 DIAGNOSIS — R05 Cough: Secondary | ICD-10-CM | POA: Diagnosis not present

## 2020-07-01 DIAGNOSIS — R059 Cough, unspecified: Secondary | ICD-10-CM

## 2020-07-01 DIAGNOSIS — C3402 Malignant neoplasm of left main bronchus: Secondary | ICD-10-CM

## 2020-07-01 DIAGNOSIS — K219 Gastro-esophageal reflux disease without esophagitis: Secondary | ICD-10-CM

## 2020-07-01 DIAGNOSIS — J449 Chronic obstructive pulmonary disease, unspecified: Secondary | ICD-10-CM | POA: Diagnosis not present

## 2020-07-01 DIAGNOSIS — J90 Pleural effusion, not elsewhere classified: Secondary | ICD-10-CM

## 2020-07-01 MED ORDER — OMEPRAZOLE 40 MG PO CPDR: 40.0000 mg | DELAYED_RELEASE_CAPSULE | Freq: Every day | ORAL | 6 refills | Status: DC

## 2020-07-01 MED ORDER — GABAPENTIN 300 MG PO CAPS: 300.0000 mg | ORAL_CAPSULE | Freq: Three times a day (TID) | ORAL | 0 refills | Status: DC

## 2020-07-01 MED ORDER — TRELEGY ELLIPTA 100-62.5-25 MCG/INH IN AEPB: 1.0000 | INHALATION_SPRAY | Freq: Every day | RESPIRATORY_TRACT | 0 refills | Status: AC

## 2020-07-01 NOTE — Patient Instructions (Addendum)
   Take your gabapentin (Neurontin) 1 capsule at bedtime  We are increasing your omeprazole, new prescription was sent in  I am giving you a trial of Trelegy Ellipta 1 inhalation daily  DO NOT Honor  Let us know if the Trelegy works for you  We will see you in follow-up in 3 to 4 weeks time with either me or the nurse practitioner  I will follow up on the echocardiogram you are going to have on 21 July

## 2020-07-01 NOTE — Progress Notes (Signed)
Subjective:    Patient ID: Derrick Gallegos, male    DOB: March 13, 1951, 69 y.o.   MRN: [id number]  HPI Patient is a 69 year old former smoker (quit 2004) who presents for evaluation of dyspnea.  Recall that we had seen him previously for evaluation of a left lung mass.  He underwent bronchoscopy on 29 October 2019 which was consistent with squamous cell carcinoma stage III unresectable.  She has been undergoing chemotherapy as directed by [name].  By Dr. Janese Banks on 29 June and at that time complained of increasing shortness of breath therefore he has been rereferred here.  Recall that his tumor burden was high with a lung mass measuring 6.3 x 3.4 cm.  He also had bilateral mediastinal adenopathy as well as left supraclavicular adenopathy.  Concurrent chemo/XRT.  He was started on maintenance durvalumab in February 2021 after his scans showed partial response.  He has also had a right renal mass noted which is being monitored for now.  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  Had malaise and fatigue since starting durvalumab.  He also has noted ongoing issues with cough which has been nonproductive, no hemoptysis.  He is on Bevespi for COPD but does not find this helps with the shortness of breath.  He also notices arthralgias and myalgias.  He also has ongoing issues with anxiety.  He had a prednisone taper pack prescribed that did not help any of his symptoms.  He does admit that he is not using his Bevespi regularly.  CT scan of the chest in May did not show any evidence of pneumonitis.  The scan was done at the onset of his symptoms of increasing shortness of breath.  He has a small left pleural effusion with decreased size mediastinal adenopathy.  Effusion is associated with atelectasis post radiation.  He does have left perihilar architectural distortion and masslike density.   The film was independently reviewed and representative slices are included below.  Review of Systems  A 10 point review of systems was performed and it is as noted above otherwise negative.  Immunization History  Administered Date(s) Administered  . Fluad Quad(high Dose 65+) 11/26/2019   Current Meds  Medication Sig  . acetaminophen (TYLENOL) 500 MG tablet Take 1,000 mg by mouth every 6 (six) hours as needed for moderate pain.   Marland Kitchen empagliflozin (JARDIANCE) 25 MG TABS tablet Take 25 mg by mouth daily.  Marland Kitchen gabapentin (NEURONTIN) 300 MG capsule Take 1 capsule (300 mg total) by mouth 3 (three) times daily.  Marland Kitchen glipiZIDE (GLUCOTROL) 10 MG tablet Take 10 mg by mouth daily before breakfast.   . Glycopyrrolate-Formoterol (BEVESPI AEROSPHERE) 9-4.8 MCG/ACT AERO Inhale 2 puffs into the lungs 2 (two) times daily.  Marland Kitchen HYDROcodone-homatropine (HYCODAN) 5-1.5 MG/5ML syrup Take 5 mLs by mouth every 6 (six) hours as needed for cough.  . losartan (COZAAR) 50 MG tablet Take 50 mg by mouth daily.   . methimazole (TAPAZOLE) 10 MG tablet Take 10 mg by mouth in the morning and at bedtime.  Marland Kitchen morphine (ROXANOL) 20 MG/ML concentrated solution Take 1 mL (20 mg total) by mouth every 4 (four) hours as needed for severe pain.  . nitroGLYCERIN (NITROSTAT) 0.4 MG SL tablet Place 1 tablet (0.4 mg total) under the tongue every 5 (five) minutes as needed for chest pain. Maximum of 3 doses.  . Omega-3  Fatty Acids (FISH OIL) 1200 MG CAPS Take 2,400 mg by mouth at bedtime.   . rivaroxaban (XARELTO) 20 MG TABS tablet Take 1 tablet (20 mg total) by mouth daily with supper.  . sertraline (ZOLOFT) 25 MG tablet Take 1 tablet (25 mg total) by mouth daily.  . [DISCONTINUED] amoxicillin-clavulanate (AUGMENTIN) 875-125 MG tablet Take 1 tablet by mouth 2 (two) times daily.  . [DISCONTINUED] ezetimibe (ZETIA) 10 MG tablet Take 1 tablet (10 mg total) by mouth daily.  . [DISCONTINUED] LORazepam (ATIVAN) 0.5 MG tablet Take 1 tablet (0.5 mg  total) by mouth 2 (two) times daily as needed for anxiety.  . [DISCONTINUED] omeprazole (PRILOSEC) 20 MG capsule Take 1 capsule by mouth as needed.  . [DISCONTINUED] predniSONE (DELTASONE) 20 MG tablet Take 2 tablets (40 mg total) by mouth daily.       Objective:   Physical Exam BP 128/72 (BP Location: Left Arm, Cuff Size: Normal)   Pulse 80   Temp 98.2 F (36.8 C) (Temporal)   Ht 5\' 11"  (1.803 m)   Wt 239 lb (108.4 kg)   SpO2 97%   BMI 33.33 kg/m   GENERAL: Well-developed, well-nourished gentleman, fully ambulatory.  No respiratory distress. HEAD: Normocephalic, atraumatic.  EYES: Pupils equal, round, reactive to light.  No scleral icterus.  MOUTH: Nose/mouth/throat not examined due to masking requirements for COVID 19. NECK: Supple. No thyromegaly. Trachea midline. No JVD.  No adenopathy. PULMONARY: Symmetrical air entry.  Diminished on left base.  Coarse breath sounds, no other adventitious sounds. CARDIOVASCULAR: S1 and S2. Regular rate and rhythm.  No rubs, murmurs or gallops. GASTROINTESTINAL: Benign. MUSCULOSKELETAL: No joint deformity, no clubbing, no edema.  NEUROLOGIC: No overt focal deficit.  Gait normal.  Speech fluent. SKIN: Intact,warm,dry.  Limited exam no rashes PSYCH: Mood and behavior normal.  Images from CT scan 14 May 2020, independently reviewed:        Assessment & Plan:     ICD-10-CM   1. Shortness of breath  R06.02    Multifactorial Poorly compensated COPD Structural changes in the lung post radiation/chemo Doubt chemotherapy toxicity Pleural effusion on left, small   2. COPD suggested by initial evaluation ([place])  J44.9    Switch Bevespi to Trelegy Will need PFTs in the future  3. Malignant neoplasm of hilus of left lung (HCC)  C34.02    This issue adds complexity to his management No evidence of recurrence on recent CT  4. Cough  R05    Multifactorial Has significant GERD Encouraged to take gabapentin at bedtime  5. Gastroesophageal  reflux disease, unspecified whether esophagitis present  K21.9    Increase PPI May need GI evaluation  6. Pleural effusion on left  J90    Small by recent CT chest By clinical exam still present (base)   Current Meds  Medication Sig  . acetaminophen (TYLENOL) 500 MG tablet Take 1,000 mg by mouth every 6 (six) hours as needed for moderate pain.   Marland Kitchen empagliflozin (JARDIANCE) 25 MG TABS tablet Take 25 mg by mouth daily.  Marland Kitchen gabapentin (NEURONTIN) 300 MG capsule Take 1 capsule (300 mg total) by mouth 3 (three) times daily.  Marland Kitchen glipiZIDE (GLUCOTROL) 10 MG tablet Take 10 mg by mouth daily before breakfast.   . Glycopyrrolate-Formoterol (BEVESPI AEROSPHERE) 9-4.8 MCG/ACT AERO Inhale 2 puffs into the lungs 2 (two) times daily.  Marland Kitchen HYDROcodone-homatropine (HYCODAN) 5-1.5 MG/5ML syrup Take 5 mLs by mouth every 6 (six) hours as needed for cough.  . losartan (COZAAR)  50 MG tablet Take 50 mg by mouth daily.   . methimazole (TAPAZOLE) 10 MG tablet Take 10 mg by mouth in the morning and at bedtime.  Marland Kitchen morphine (ROXANOL) 20 MG/ML concentrated solution Take 1 mL (20 mg total) by mouth every 4 (four) hours as needed for severe pain.  . nitroGLYCERIN (NITROSTAT) 0.4 MG SL tablet Place 1 tablet (0.4 mg total) under the tongue every 5 (five) minutes as needed for chest pain. Maximum of 3 doses.  . Omega-3 Fatty Acids (FISH OIL) 1200 MG CAPS Take 2,400 mg by mouth at bedtime.   . rivaroxaban (XARELTO) 20 MG TABS tablet Take 1 tablet (20 mg total) by mouth daily with supper.  . sertraline (ZOLOFT) 25 MG tablet Take 1 tablet (25 mg total) by mouth daily.  . [DISCONTINUED] amoxicillin-clavulanate (AUGMENTIN) 875-125 MG tablet Take 1 tablet by mouth 2 (two) times daily.  . [DISCONTINUED] ezetimibe (ZETIA) 10 MG tablet Take 1 tablet (10 mg total) by mouth daily.  . [DISCONTINUED] LORazepam (ATIVAN) 0.5 MG tablet Take 1 tablet (0.5 mg total) by mouth 2 (two) times daily as needed for anxiety.  . [DISCONTINUED] omeprazole  (PRILOSEC) 20 MG capsule Take 1 capsule by mouth as needed.  . [DISCONTINUED] predniSONE (DELTASONE) 20 MG tablet Take 2 tablets (40 mg total) by mouth daily.    Discussion:  Patient is a very complex 69 year old with stage III squamous cell carcinoma of the lung status post concurrent chemoradiation.  Patient did not tolerate immunotherapy because of worsening symptoms of myalgias, arthralgias and potential thyroid issues.  The patient has been having increasing dyspnea.  He does admit not using his inhaler regularly.  Does not note improvement with the inhaler but not using it consistently.  Will give him a trial of Trelegy Ellipta to see if this improves with compliance as it is only once a day.  With regards to his cough this may be related to his residual lung scarring post chemoradiation however he does have significant gastroesophageal reflux that is poorly controlled.  PPI has been increased.  I have asked him to take gabapentin at bedtime.  He had also discontinued this medication and this may help with his cough.  He will need pulmonary function testing but at present he would like to hold off on this.  He is to have an echocardiogram on 21 July as he is being followed by cardiology as well.  We will review the results of that echocardiogram when available.  We will see him in follow-up in 3 to 4 weeks time he is to contact us prior to that time should any new difficulties arise.    Renold Don, MD [facility]   *This note was dictated using voice recognition software/Dragon.  Despite best efforts to proofread, errors can occur which can change the meaning.  Any change was purely unintentional.

## 2020-07-02 ENCOUNTER — Other Ambulatory Visit: Payer: Self-pay

## 2020-07-02 MED ORDER — EZETIMIBE 10 MG PO TABS: 10.0000 mg | ORAL_TABLET | Freq: Every day | ORAL | 0 refills | Status: AC

## 2020-07-13 ENCOUNTER — Other Ambulatory Visit: Payer: Self-pay | Admitting: Oncology

## 2020-07-13 ENCOUNTER — Ambulatory Visit: Payer: Medicare Other

## 2020-07-13 DIAGNOSIS — C3412 Malignant neoplasm of upper lobe, left bronchus or lung: Secondary | ICD-10-CM

## 2020-07-13 MED ORDER — LORAZEPAM 0.5 MG PO TABS: 0.5000 mg | ORAL_TABLET | Freq: Two times a day (BID) | ORAL | 0 refills | Status: DC | PRN

## 2020-07-14 ENCOUNTER — Encounter: Payer: Self-pay | Admitting: General Practice

## 2020-07-15 ENCOUNTER — Telehealth: Payer: Self-pay

## 2020-07-15 ENCOUNTER — Ambulatory Visit (INDEPENDENT_AMBULATORY_CARE_PROVIDER_SITE_OTHER): Payer: Medicare Other

## 2020-07-15 ENCOUNTER — Other Ambulatory Visit: Payer: Self-pay

## 2020-07-15 ENCOUNTER — Ambulatory Visit
Admission: RE | Admit: 2020-07-15 | Discharge: 2020-07-15 | Disposition: A | Discharge status: Home / Self Care | Payer: Medicare Other | Source: Ambulatory Visit | Attending: Internal Medicine | Admitting: Internal Medicine

## 2020-07-15 DIAGNOSIS — R0602 Shortness of breath: Secondary | ICD-10-CM

## 2020-07-15 DIAGNOSIS — I2510 Atherosclerotic heart disease of native coronary artery without angina pectoris: Secondary | ICD-10-CM

## 2020-07-15 DIAGNOSIS — R079 Chest pain, unspecified: Secondary | ICD-10-CM

## 2020-07-15 LAB — ECHOCARDIOGRAM COMPLETE
AR max vel: 3.09 cm2
AV Area VTI: 3.63 cm2
AV Area mean vel: 3.23 cm2
AV Mean grad: 3 mmHg
AV Peak grad: 6.7 mmHg
Ao pk vel: 1.29 m/s
Area-P 1/2: 2.25 cm2
Calc EF: 66.8 %
S' Lateral: 2.4 cm
Single Plane A2C EF: 69.3 %
Single Plane A4C EF: 63.8 %

## 2020-07-15 MED ORDER — FUROSEMIDE 20 MG PO TABS
20.0000 mg | ORAL_TABLET | Freq: Every day | ORAL | 3 refills | Status: DC
Start: 2020-07-15 — End: 2020-07-29

## 2020-07-15 NOTE — Telephone Encounter (Signed)
Late entry:  Pt in office for echo. Per echo tech pt has "growing L pleural effusion in comparison to CT". Pt c/o SOB when lying flat during echo exam.   o2 97%, HR 71.  Spoke face to face with Dr. Kate Sable to advise. He requested lasix rx 20 mg once daily [facility]   Pt has office visit 8/5 with Owens Shark, NP. Encouraged pt to call if sx change or worsen prior to scheduled appt.

## 2020-07-15 NOTE — Addendum Note (Signed)
Addended by: Verlon Au on: 07/15/2020 02:39 PM   Modules accepted: Orders

## 2020-07-15 NOTE — Telephone Encounter (Signed)
Call to patient to discuss POC from Dr. Saunders Revel. No answer, left detailed message. Orders placed.   Asked pt to call and confirm that he has received message.

## 2020-07-15 NOTE — Telephone Encounter (Signed)
Please arrange for Derrick Gallegos to have a PA and lateral chest x-ray to further evaluate the pleural effusion.  It may be that he needs a thoracentesis.  Thanks.  Chris Cullan Launer

## 2020-07-15 NOTE — Telephone Encounter (Signed)
Coming call returned by patient. He verbalized understanding and had no further questions at this time. Agreeable to POC>

## 2020-07-16 ENCOUNTER — Other Ambulatory Visit: Payer: Self-pay

## 2020-07-16 ENCOUNTER — Ambulatory Visit: Payer: Medicare Other

## 2020-07-16 DIAGNOSIS — J90 Pleural effusion, not elsewhere classified: Secondary | ICD-10-CM

## 2020-07-17 ENCOUNTER — Encounter: Payer: Self-pay | Admitting: Urology

## 2020-07-19 ENCOUNTER — Other Ambulatory Visit: Payer: Self-pay

## 2020-07-19 ENCOUNTER — Ambulatory Visit
Admission: RE | Admit: 2020-07-19 | Discharge: 2020-07-19 | Disposition: A | Discharge status: Home / Self Care | Payer: Medicare Other | Source: Ambulatory Visit | Attending: Pulmonary Disease | Admitting: Pulmonary Disease

## 2020-07-19 ENCOUNTER — Other Ambulatory Visit: Payer: Self-pay | Admitting: Student

## 2020-07-19 ENCOUNTER — Other Ambulatory Visit
Admission: RE | Admit: 2020-07-19 | Discharge: 2020-07-19 | Disposition: A | Discharge status: Home / Self Care | Payer: Medicare Other | Source: Ambulatory Visit | Attending: Pulmonary Disease | Admitting: Pulmonary Disease

## 2020-07-19 ENCOUNTER — Ambulatory Visit
Admission: RE | Admit: 2020-07-19 | Discharge: 2020-07-19 | Disposition: A | Discharge status: Home / Self Care | Payer: Medicare Other | Source: Ambulatory Visit | Attending: Student | Admitting: Student

## 2020-07-19 ENCOUNTER — Telehealth: Payer: Self-pay | Admitting: Pulmonary Disease

## 2020-07-19 DIAGNOSIS — Z20822 Contact with and (suspected) exposure to covid-19: Secondary | ICD-10-CM | POA: Insufficient documentation

## 2020-07-19 DIAGNOSIS — J90 Pleural effusion, not elsewhere classified: Secondary | ICD-10-CM | POA: Diagnosis present

## 2020-07-19 DIAGNOSIS — Z9889 Other specified postprocedural states: Secondary | ICD-10-CM

## 2020-07-19 DIAGNOSIS — Z1152 Encounter for screening for COVID-19: Secondary | ICD-10-CM

## 2020-07-19 LAB — AMYLASE, PLEURAL OR PERITONEAL FLUID: Amylase, Fluid: 23 U/L

## 2020-07-19 LAB — ALBUMIN, PLEURAL OR PERITONEAL FLUID: Albumin, Fluid: 2.5 g/dL

## 2020-07-19 LAB — PROTEIN, PLEURAL OR PERITONEAL FLUID: Total protein, fluid: 4.1 g/dL

## 2020-07-19 LAB — BODY FLUID CELL COUNT WITH DIFFERENTIAL
Eos, Fluid: 6 %
Lymphs, Fluid: 19 %
Monocyte-Macrophage-Serous Fluid: 17 %
Neutrophil Count, Fluid: 58 %
Total Nucleated Cell Count, Fluid: 901 cu mm

## 2020-07-19 LAB — GLUCOSE, PLEURAL OR PERITONEAL FLUID: Glucose, Fluid: 140 mg/dL

## 2020-07-19 LAB — LACTATE DEHYDROGENASE, PLEURAL OR PERITONEAL FLUID: LD, Fluid: 375 U/L — ABNORMAL HIGH (ref 3–23)

## 2020-07-19 LAB — SARS CORONAVIRUS 2 BY RT PCR (HOSPITAL ORDER, PERFORMED IN ~~LOC~~ HOSPITAL LAB): SARS Coronavirus 2: NEGATIVE

## 2020-07-19 NOTE — Procedures (Addendum)
PROCEDURE SUMMARY:  Successful US guided diagnostic and therapeutic left thoracentesis. Yielded 600 mL of dark, bloody fluid. Pt tolerated procedure well. No immediate complications.  Of note, fluid loculated by [place] today.   Specimen was sent for labs. CXR ordered.  EBL < 5 mL  Docia Barrier PA-C 07/19/2020 3:25 PM

## 2020-07-19 NOTE — Telephone Encounter (Signed)
It appears that call was made to pt to make him aware of need for rapid covid test.  Pt has had test, Nothing further is needed.

## 2020-07-20 LAB — PH, BODY FLUID: pH, Body Fluid: 7.4

## 2020-07-20 LAB — ACID FAST SMEAR (AFB, MYCOBACTERIA): Acid Fast Smear: NEGATIVE

## 2020-07-21 LAB — CYTOLOGY - NON PAP

## 2020-07-23 LAB — BODY FLUID CULTURE: Culture: NO GROWTH

## 2020-07-26 ENCOUNTER — Other Ambulatory Visit: Payer: Self-pay | Admitting: Oncology

## 2020-07-26 ENCOUNTER — Other Ambulatory Visit: Payer: Self-pay | Admitting: Hospice and Palliative Medicine

## 2020-07-26 ENCOUNTER — Ambulatory Visit: Payer: Medicare Other | Admitting: Family

## 2020-07-26 ENCOUNTER — Ambulatory Visit: Payer: Medicare Other

## 2020-07-26 MED ORDER — GABAPENTIN 300 MG PO CAPS: 300.0000 mg | ORAL_CAPSULE | Freq: Three times a day (TID) | ORAL | 3 refills | Status: AC

## 2020-07-26 NOTE — Telephone Encounter (Signed)
Josh NP refilled med for pt

## 2020-07-26 NOTE — Telephone Encounter (Signed)
Dr. Janese Banks pt

## 2020-07-28 ENCOUNTER — Ambulatory Visit: Payer: Medicare Other | Admitting: Urology

## 2020-07-29 ENCOUNTER — Other Ambulatory Visit: Payer: Self-pay

## 2020-07-29 ENCOUNTER — Encounter: Payer: Self-pay | Admitting: Family

## 2020-07-29 ENCOUNTER — Ambulatory Visit (INDEPENDENT_AMBULATORY_CARE_PROVIDER_SITE_OTHER): Payer: Medicare Other | Admitting: Family

## 2020-07-29 VITALS — BP 110/64 | HR 77 | Ht 71.0 in | Wt 236.2 lb

## 2020-07-29 DIAGNOSIS — J90 Pleural effusion, not elsewhere classified: Secondary | ICD-10-CM

## 2020-07-29 DIAGNOSIS — E785 Hyperlipidemia, unspecified: Secondary | ICD-10-CM

## 2020-07-29 DIAGNOSIS — I25118 Atherosclerotic heart disease of native coronary artery with other forms of angina pectoris: Secondary | ICD-10-CM | POA: Diagnosis not present

## 2020-07-29 DIAGNOSIS — I10 Essential (primary) hypertension: Secondary | ICD-10-CM

## 2020-07-29 DIAGNOSIS — R059 Cough, unspecified: Secondary | ICD-10-CM

## 2020-07-29 DIAGNOSIS — R05 Cough: Secondary | ICD-10-CM

## 2020-07-29 MED ORDER — BENZONATATE 100 MG PO CAPS
100.0000 mg | ORAL_CAPSULE | Freq: Three times a day (TID) | ORAL | 0 refills | Status: DC | PRN
Start: 2020-07-29 — End: 2020-09-27

## 2020-07-29 MED ORDER — FUROSEMIDE 20 MG PO TABS
20.0000 mg | ORAL_TABLET | Freq: Every day | ORAL | 3 refills | Status: AC
Start: 2020-07-29 — End: 2020-10-27

## 2020-07-29 NOTE — Progress Notes (Signed)
Office Visit    Patient Name: Derrick Gallegos Date of Encounter: 07/30/2020  Primary Care Provider:  [facility] Primary Cardiologist:  [name], MD Electrophysiologist:  None   Chief Complaint    Derrick Gallegos is a 69 y.o. male with a hx of CAD with reported CABGx3 at [facility] in 2004 and subsequent PCIx2 to [facility] 07/2018, atrial flutter s/p TEE guided DCCV 04/2018, HTN, HLD with statin intolerance, DM2, lung cancer s/p chemo and radiation, prior tobacco use presents today for follow up after echo   Past Medical History    Past Medical History:  Diagnosis Date  . Anxiety   . Arthritis   . Atrial flutter ([place])    a. diagnosed 5/19; b. s/p TEE/DCCV 05/2018 by Dr. Humphrey Rolls; c. CHADS2VASc => 4 (HTN, age x1, DM, vascular disease)  . Complication of anesthesia    "wakes up too early"  . Coronary artery disease    a. s/p 3-v cabg in 2004 at [facility] (LIMA and radial arery used - no further details in Care Everywhere); b. LHC 8/19 underlying multi-V dz, patent LIMA-LAD, occluded VG-PDA, 90-95% stenosis of LCx s/p PCI/DES x 2  . Cough   . Depression   . Diabetes mellitus with complication ([place])   . Dyspnea   . Dysrhythmia    atrial fib.  . Essential hypertension   . Family history of premature CAD    a. father passed away from an MI at 75  . GERD (gastroesophageal reflux disease)   . Heart murmur   . HLD (hyperlipidemia)    a. statin intolerant   . Lung cancer ([place]) 09/2019   Chemo + Rad tx's.   . Mass of left lung   . MI (myocardial infarction) ([place])    2004  . Obesity    Past Surgical History:  Procedure Laterality Date  . CARDIAC CATHETERIZATION  2004  . CARDIAC CATHETERIZATION  2019   x2 stents [facility]  . CARDIAC SURGERY    . CARDIOVERSION N/A 06/20/2018   Procedure: CARDIOVERSION;  Surgeon: Dionisio David, MD;  Location: [facility] ORS;  Service: Cardiovascular;  Laterality: N/A;  . CORONARY ARTERY BYPASS GRAFT    . CORONARY STENT INTERVENTION N/A 08/08/2018    Procedure: CORONARY STENT INTERVENTION;  Surgeon: Isaias Cowman, MD;  Location: [place] CV LAB;  Service: Cardiovascular;  Laterality: N/A;  . LEFT HEART CATH AND CORONARY ANGIOGRAPHY Left 08/08/2018   Procedure: LEFT HEART CATH AND CORONARY ANGIOGRAPHY;  Surgeon: Dionisio David, MD;  Location: [place] [facility];  Service: Cardiovascular;  Laterality: Left;  . PORTA CATH INSERTION N/A 11/10/2019   Procedure: PORTA CATH INSERTION;  Surgeon: Algernon Huxley, MD;  Location: [place] [facility];  Service: Cardiovascular;  Laterality: N/A;  . TEE WITHOUT CARDIOVERSION N/A 06/20/2018   Procedure: TRANSESOPHAGEAL ECHOCARDIOGRAM (TEE);  Surgeon: Dionisio David, MD;  Location: [facility] ORS;  Service: Cardiovascular;  Laterality: N/A;  . VIDEO BRONCHOSCOPY N/A 10/29/2019   Procedure: VIDEO BRONCHOSCOPY WITH ENDOBRONCHICAL ULTRASOUND;  Surgeon: Tyler Pita, MD;  Location: [facility] ORS;  Service: Cardiopulmonary;  Laterality: N/A;    Allergies  No Known Allergies  History of Present Illness    Derrick Gallegos is a 69 y.o. male with a hx of CAD with reported CABGx3 at [facility] in 2004 and subsequent PCIx2 to [facility] 07/2018, atrial flutter s/p TEE guided DCCV 04/2018, HTN, HLD with statin intolerance, DM2, lung cancer s/p chemo and radiation, prior tobacco use  last seen 06/14/20 by Dr. Saunders Revel.  When seen in clinic 06/14/20 noted shortness of breath. Had begun immuno therapy a few months prior (diagnosed with squamos cell cancer of left lung last summer and underwent chemotherapy and radiation).   He has been intolerant of multiple statins and unable to afford PCSK9 inhibitors previously.   Seen in clinic 06/14/20 noting shortness of breath. Diagnosed with squamos cell lung cancer and underwent chemo and radiation. Began immunotherapy a few months prior to that appointment and noted significant exertional dyspnea. Also noted mylagias with immunotherapy similar to those previously noted with statins. CT chest  notable for development of pleural effusion. He was recommended for echocardiogram. His LDL was 123 and he was referred to lipid clinic for re-discussion of PCSK9i.   07/15/20 echocardiogram EF 55-60%, no RWMA, gr1DD, RV normal size and function, mildly elevated PASP, moderate left pleural effusion, borderline dilation of ascending aorta (75mm). CXR 07/15/20 with interval enlargement of moderate left-sided effusions. He was scheduled for thoracentesis per pulmonology and oncology. Underwent thoracentesis 07/19/20 of 641mL of dark bloody fluid. Post procedure CXR with decrease in left effusion and no complicating feature nor pneumothorax.  Present today for follow up with his daughter. Reports improvement in his dyspnea since thoracentesis, but still persists. Continued nonproductive cough though improved compared to previous.   Reports no chest pain, pressure, or tightness. No edema, orthopnea, PND. Reports no palpitations.    EKGs/Labs/Other Studies Reviewed:   The following studies were reviewed today: Echo 07/15/20  1. Left ventricular ejection fraction, by estimation, is 55 to 60%. The  left ventricle has normal function. The left ventricle has no regional  wall motion abnormalities. Left ventricular diastolic parameters are  consistent with Grade I diastolic  dysfunction (impaired relaxation).   2. Right ventricular systolic function is normal. The right ventricular  size is normal. There is mildly elevated pulmonary artery systolic  pressure.   3. Moderate pleural effusion in the left lateral region.   4. The mitral valve is normal in structure. No evidence of mitral valve  regurgitation.   5. The aortic valve is normal in structure. Aortic valve regurgitation is  not visualized.   6. Aortic dilatation noted. There is borderline dilatation of the  ascending aorta measuring 38 mm.   7. The inferior vena cava is normal in size with <50% respiratory  variability, suggesting right atrial  pressure of 8 mmHg.   EKG:  EKG is  ordered today.  The ekg ordered today demonstrates SR 77 bpm with RBBB  Recent Labs: 05/21/2020: TSH 0.102 06/22/2020: ALT 12; BUN 18; Creatinine, Ser 0.81; Hemoglobin 14.4; Platelets 298; Potassium 4.0; Sodium 133  Recent Lipid Panel    Component Value Date/Time   CHOL 193 06/22/2020 0842   CHOL 287 (H) 12/23/2018 1012   TRIG 131 06/22/2020 0842   HDL 44 06/22/2020 0842   HDL 41 12/23/2018 1012   CHOLHDL 4.4 06/22/2020 0842   VLDL 26 06/22/2020 0842   LDLCALC 123 (H) 06/22/2020 0842   LDLCALC Comment 12/23/2018 1012   LDLDIRECT 163 (H) 12/23/2018 1012    Home Medications   Current Meds  Medication Sig  . acetaminophen (TYLENOL) 500 MG tablet Take 1,000 mg by mouth every 6 (six) hours as needed for moderate pain.   Marland Kitchen empagliflozin (JARDIANCE) 25 MG TABS tablet Take 25 mg by mouth daily.  Marland Kitchen ezetimibe (ZETIA) 10 MG tablet Take 1 tablet (10 mg total) by mouth daily.  Marland Kitchen gabapentin (NEURONTIN) 300 MG capsule  Take 1 capsule (300 mg total) by mouth 3 (three) times daily.  Marland Kitchen glipiZIDE (GLUCOTROL) 10 MG tablet Take 10 mg by mouth daily before breakfast.   . Glycopyrrolate-Formoterol (BEVESPI AEROSPHERE) 9-4.8 MCG/ACT AERO Inhale 2 puffs into the lungs 2 (two) times daily.  Marland Kitchen LORazepam (ATIVAN) 0.5 MG tablet Take 1 tablet (0.5 mg total) by mouth 2 (two) times daily as needed for anxiety.  Marland Kitchen losartan (COZAAR) 50 MG tablet Take 50 mg by mouth daily.   . methimazole (TAPAZOLE) 10 MG tablet Take 10 mg by mouth in the morning and at bedtime.  . metoprolol tartrate (LOPRESSOR) 25 MG tablet Take 25 mg by mouth at bedtime.  Marland Kitchen morphine (ROXANOL) 20 MG/ML concentrated solution Take 1 mL (20 mg total) by mouth every 4 (four) hours as needed for severe pain.  . nitroGLYCERIN (NITROSTAT) 0.4 MG SL tablet Place 1 tablet (0.4 mg total) under the tongue every 5 (five) minutes as needed for chest pain. Maximum of 3 doses.  . Omega-3 Fatty Acids (FISH OIL) 1200 MG CAPS  Take 2,400 mg by mouth at bedtime.   Marland Kitchen omeprazole (PRILOSEC) 40 MG capsule Take 1 capsule (40 mg total) by mouth daily.  . rivaroxaban (XARELTO) 20 MG TABS tablet Take 1 tablet (20 mg total) by mouth daily with supper.  . sertraline (ZOLOFT) 25 MG tablet Take 1 tablet (25 mg total) by mouth daily.      Review of Systems    All other systems reviewed and are otherwise negative except as noted above.  Physical Exam    VS:  BP 110/64 (BP Location: Left Arm, Patient Position: Sitting, Cuff Size: Normal)   Pulse 77   Ht 5\' 11"  (1.803 m)   Wt 236 lb 3.2 oz (107.1 kg)   SpO2 96%   BMI 32.94 kg/m  , BMI Body mass index is 32.94 kg/m. GEN: Well nourished, well developed, in no acute distress. HEENT: normal. Neck: Supple, no JVD, carotid bruits, or masses. Cardiac: RRR, no murmurs, rubs, or gallops. No clubbing, cyanosis, edema.  Radials/DP/PT 2+ and equal bilaterally.  Respiratory:  Respirations regular and unlabored, clear to auscultation bilaterally. GI: Soft, nontender, nondistended, BS + x 4. MS: No deformity or atrophy. Skin: Warm and dry, no rash. Neuro:  Strength and sensation are intact. Psych: Normal affect. Assessment & Plan    1. CAD s/p CABG and PCI - Recent dyspnea coincides with lung cancer diagnosis and initiation of immunotherapy. S/p thoracentesis. Recent echo with preserved LVEF and no RWMA. No evidence of cardiomyopathy. Discussed in clinic and agreed to defer ischemic evaluation as his dyspnea is improving, EKG without acute changes, and no chest pain, pressure, tightness. Continue Metoprolol.   2. Pleural effusion - Likely malignant. S/p thoracentesis with improvement in dyspnea, but still present. Start Lasix 20mg  daily for one week. Repeat BMP in 1 week. Continue to follow with oncology and pulmonology.   3. Atrial flutter - No symptoms to suggest recurrence. Maintaining NSR on EKG today. Continue indefinite anticoagulation with Xarelto and low dose  Metoprolol.  4. Squamos cell carcinoma of lung - Continue to follow with oncology.   5. HLD - Intolerant of multiple statin. Tolerates Zetia though recent lipid penl 06/22/20 with LDL 123 (LDL goal <70). Has been referred to lipid clinic for [facility] as he will require assistance with cost.   Disposition: Follow up in 2 week(s) with Dr. Saunders Revel or APP   Loel Dubonnet, NP 07/30/2020, 11:10 PM

## 2020-07-29 NOTE — Patient Instructions (Addendum)
Medication Instructions:  1- Take Lasix (20 mg total) once daily x 1 week. (from the medication you already have at home) *If you need a refill on your cardiac medications before your next appointment, please call your pharmacy*   Lab Work:Your physician recommends that you return for lab work in: [facility] ([facility]) No appt is needed. Hours are M-F 7AM- 6 PM.   If you have labs (blood work) drawn today and your tests are completely normal, you will receive your results only by: Marland Kitchen MyChart Message (if you have MyChart) OR . A paper copy in the mail If you have any lab test that is abnormal or we need to change your treatment, we will call you to review the results.   Testing/Procedures: Your EKG today was stable compared to previous.  Your echocardiogram shows a normal heart pumping function and that your heart was mildly stiff.   Follow-Up: At [facility], you and your health needs are our priority.  As part of our continuing mission to provide you with exceptional heart care, we have created designated Provider Care Teams.  These Care Teams include your primary Cardiologist (physician) and Advanced Practice Providers (APPs -  Physician Assistants and Nurse Practitioners) who all work together to provide you with the care you need, when you need it.  We recommend signing up for the patient portal called "MyChart".  Sign up information is provided on this After Visit Summary.  MyChart is used to connect with patients for Virtual Visits (Telemedicine).  Patients are able to view lab/test results, encounter notes, upcoming appointments, etc.  Non-urgent messages can be sent to your provider as well.   To learn more about what you can do with MyChart, go to [email or web address].    Your next appointment:  Follow up in 2-3 weeks

## 2020-08-03 ENCOUNTER — Encounter: Payer: Self-pay | Admitting: Oncology

## 2020-08-05 ENCOUNTER — Other Ambulatory Visit: Payer: Self-pay | Admitting: *Deleted

## 2020-08-05 ENCOUNTER — Ambulatory Visit: Payer: Medicare Other | Admitting: Pulmonary Disease

## 2020-08-05 DIAGNOSIS — C3490 Malignant neoplasm of unspecified part of unspecified bronchus or lung: Secondary | ICD-10-CM

## 2020-08-05 DIAGNOSIS — J90 Pleural effusion, not elsewhere classified: Secondary | ICD-10-CM

## 2020-08-05 MED ORDER — HYDROCODONE-HOMATROPINE 5-1.5 MG/5ML PO SYRP: 5.0000 mL | ORAL_SOLUTION | Freq: Four times a day (QID) | ORAL | 0 refills | Status: DC | PRN

## 2020-08-05 MED ORDER — MORPHINE SULFATE (CONCENTRATE) 20 MG/ML PO SOLN: 20.0000 mg | ORAL | 0 refills | Status: DC | PRN

## 2020-08-09 ENCOUNTER — Encounter: Payer: Self-pay | Admitting: *Deleted

## 2020-08-09 ENCOUNTER — Other Ambulatory Visit: Payer: Self-pay | Admitting: *Deleted

## 2020-08-09 ENCOUNTER — Telehealth: Payer: Self-pay | Admitting: Oncology

## 2020-08-09 ENCOUNTER — Encounter: Payer: Self-pay | Admitting: Family

## 2020-08-09 DIAGNOSIS — C3412 Malignant neoplasm of upper lobe, left bronchus or lung: Secondary | ICD-10-CM

## 2020-08-09 MED ORDER — LORAZEPAM 1 MG PO TABS: 1.0000 mg | ORAL_TABLET | Freq: Three times a day (TID) | ORAL | 0 refills | Status: DC | PRN

## 2020-08-09 NOTE — Telephone Encounter (Signed)
Phone call made to patient to follow up. Pt states that he knows this is the end since he has not been feeling well over the past few weeks and recently had fluid drained from his lung. Informed pt that provider recommends follow up CT scan in 4-6 months after thoracentesis. Pt currently scheduled for CT on 9/7 but informed pt that can move his scan to early next week if he wishes since he is symptomatic. Pt stated that he needs to talk to his daughter first and would let me know. Reassurance provided and informed pt that if his cancer has recurred or progressed on recent imaging then MD can discuss further treatment options if needed. Pt verbalized understanding.   Also, pt asking for refill of lorazepam and questioned if dosage/frequency could be increased.   Please advise.

## 2020-08-09 NOTE — Telephone Encounter (Signed)
Yes he should see josh

## 2020-08-09 NOTE — Addendum Note (Signed)
Addended by: Verlon Au on: 08/09/2020 03:07 PM   Modules accepted: Orders

## 2020-08-09 NOTE — Telephone Encounter (Signed)
Was asked to schedule CT for patient and inform patient of appt date/time. CT scheduled and writer phoned patient to inform. Informed of appt date/time. Patient appeared very down on the phone and Probation officer phoned RN Navigator and requested that Navigator phone patient. Navigator stated that she would do this.

## 2020-08-09 NOTE — Telephone Encounter (Signed)
Will do. Spoke with pt and he is agreeable to moving his CT scan next week. I will move his follow up with you to 1-2 days after his CT.   Do you think pt needs to see Josh?

## 2020-08-09 NOTE — Telephone Encounter (Signed)
Can increase ativan to 1 mg TID prn

## 2020-08-16 ENCOUNTER — Encounter: Payer: Self-pay | Admitting: Family

## 2020-08-17 ENCOUNTER — Other Ambulatory Visit: Payer: Self-pay

## 2020-08-17 ENCOUNTER — Encounter: Payer: Self-pay | Admitting: *Deleted

## 2020-08-17 ENCOUNTER — Ambulatory Visit
Admission: RE | Admit: 2020-08-17 | Discharge: 2020-08-17 | Disposition: A | Discharge status: Home / Self Care | Payer: Medicare Other | Source: Ambulatory Visit | Attending: Oncology | Admitting: Oncology

## 2020-08-17 DIAGNOSIS — J90 Pleural effusion, not elsewhere classified: Secondary | ICD-10-CM

## 2020-08-17 DIAGNOSIS — C3490 Malignant neoplasm of unspecified part of unspecified bronchus or lung: Secondary | ICD-10-CM | POA: Diagnosis present

## 2020-08-17 LAB — POCT I-STAT CREATININE: Creatinine, Ser: 0.7 mg/dL (ref 0.61–1.24)

## 2020-08-17 MED ORDER — PROCHLORPERAZINE MALEATE 10 MG PO TABS
10.0000 mg | ORAL_TABLET | Freq: Four times a day (QID) | ORAL | 0 refills | Status: DC | PRN
Start: 2020-08-17 — End: 2020-09-20

## 2020-08-17 MED ORDER — IOHEXOL 300 MG/ML  SOLN
75.0000 mL | Freq: Once | INTRAMUSCULAR | Status: AC | PRN
  Administered 2020-08-17: 75 mL via INTRAVENOUS

## 2020-08-17 NOTE — Telephone Encounter (Signed)
10 mg q6 prn compazine

## 2020-08-18 LAB — FUNGAL ORGANISM REFLEX

## 2020-08-18 LAB — FUNGUS CULTURE WITH STAIN

## 2020-08-18 LAB — FUNGUS CULTURE RESULT

## 2020-08-19 ENCOUNTER — Encounter: Payer: Self-pay | Admitting: *Deleted

## 2020-08-20 ENCOUNTER — Inpatient Hospital Stay (HOSPITAL_BASED_OUTPATIENT_CLINIC_OR_DEPARTMENT_OTHER): Payer: Medicare Other | Admitting: Oncology

## 2020-08-20 ENCOUNTER — Encounter: Payer: Self-pay | Admitting: Oncology

## 2020-08-20 ENCOUNTER — Telehealth: Payer: Self-pay

## 2020-08-20 ENCOUNTER — Inpatient Hospital Stay (HOSPITAL_BASED_OUTPATIENT_CLINIC_OR_DEPARTMENT_OTHER): Payer: Medicare Other | Admitting: Hospice and Palliative Medicine

## 2020-08-20 ENCOUNTER — Ambulatory Visit: Payer: Medicare Other | Admitting: Family

## 2020-08-20 ENCOUNTER — Encounter: Payer: Self-pay | Admitting: Urology

## 2020-08-20 ENCOUNTER — Inpatient Hospital Stay: Payer: Medicare Other | Attending: Oncology

## 2020-08-20 ENCOUNTER — Other Ambulatory Visit: Payer: Self-pay

## 2020-08-20 ENCOUNTER — Other Ambulatory Visit: Payer: Self-pay | Admitting: Pulmonary Disease

## 2020-08-20 VITALS — BP 120/82 | HR 83 | Temp 96.9°F | Resp 16 | Wt 236.9 lb

## 2020-08-20 DIAGNOSIS — E0590 Thyrotoxicosis, unspecified without thyrotoxic crisis or storm: Secondary | ICD-10-CM | POA: Insufficient documentation

## 2020-08-20 DIAGNOSIS — F32A Depression, unspecified: Secondary | ICD-10-CM

## 2020-08-20 DIAGNOSIS — E041 Nontoxic single thyroid nodule: Secondary | ICD-10-CM | POA: Insufficient documentation

## 2020-08-20 DIAGNOSIS — Z87891 Personal history of nicotine dependence: Secondary | ICD-10-CM | POA: Diagnosis not present

## 2020-08-20 DIAGNOSIS — F419 Anxiety disorder, unspecified: Secondary | ICD-10-CM | POA: Insufficient documentation

## 2020-08-20 DIAGNOSIS — R0600 Dyspnea, unspecified: Secondary | ICD-10-CM

## 2020-08-20 DIAGNOSIS — C3412 Malignant neoplasm of upper lobe, left bronchus or lung: Secondary | ICD-10-CM

## 2020-08-20 DIAGNOSIS — E669 Obesity, unspecified: Secondary | ICD-10-CM | POA: Diagnosis not present

## 2020-08-20 DIAGNOSIS — E785 Hyperlipidemia, unspecified: Secondary | ICD-10-CM | POA: Insufficient documentation

## 2020-08-20 DIAGNOSIS — Z79899 Other long term (current) drug therapy: Secondary | ICD-10-CM | POA: Diagnosis not present

## 2020-08-20 DIAGNOSIS — Z7984 Long term (current) use of oral hypoglycemic drugs: Secondary | ICD-10-CM | POA: Diagnosis not present

## 2020-08-20 DIAGNOSIS — I2510 Atherosclerotic heart disease of native coronary artery without angina pectoris: Secondary | ICD-10-CM

## 2020-08-20 DIAGNOSIS — E1159 Type 2 diabetes mellitus with other circulatory complications: Secondary | ICD-10-CM | POA: Diagnosis not present

## 2020-08-20 DIAGNOSIS — C649 Malignant neoplasm of unspecified kidney, except renal pelvis: Secondary | ICD-10-CM | POA: Insufficient documentation

## 2020-08-20 DIAGNOSIS — I10 Essential (primary) hypertension: Secondary | ICD-10-CM | POA: Diagnosis not present

## 2020-08-20 DIAGNOSIS — I252 Old myocardial infarction: Secondary | ICD-10-CM | POA: Insufficient documentation

## 2020-08-20 DIAGNOSIS — Z515 Encounter for palliative care: Secondary | ICD-10-CM | POA: Diagnosis not present

## 2020-08-20 DIAGNOSIS — Z7952 Long term (current) use of systemic steroids: Secondary | ICD-10-CM | POA: Diagnosis not present

## 2020-08-20 DIAGNOSIS — F329 Major depressive disorder, single episode, unspecified: Secondary | ICD-10-CM

## 2020-08-20 DIAGNOSIS — Z7901 Long term (current) use of anticoagulants: Secondary | ICD-10-CM | POA: Insufficient documentation

## 2020-08-20 DIAGNOSIS — Z8249 Family history of ischemic heart disease and other diseases of the circulatory system: Secondary | ICD-10-CM | POA: Diagnosis not present

## 2020-08-20 DIAGNOSIS — Z923 Personal history of irradiation: Secondary | ICD-10-CM | POA: Insufficient documentation

## 2020-08-20 DIAGNOSIS — G4700 Insomnia, unspecified: Secondary | ICD-10-CM | POA: Diagnosis not present

## 2020-08-20 LAB — CBC WITH DIFFERENTIAL/PLATELET
Abs Immature Granulocytes: 0.03 10*3/uL (ref 0.00–0.07)
Basophils Absolute: 0 10*3/uL (ref 0.0–0.1)
Basophils Relative: 1 %
Eosinophils Absolute: 0.2 10*3/uL (ref 0.0–0.5)
Eosinophils Relative: 2 %
HCT: 37.2 % — ABNORMAL LOW (ref 39.0–52.0)
Hemoglobin: 12.6 g/dL — ABNORMAL LOW (ref 13.0–17.0)
Immature Granulocytes: 0 %
Lymphocytes Relative: 13 %
Lymphs Abs: 0.9 10*3/uL (ref 0.7–4.0)
MCH: 28.1 pg (ref 26.0–34.0)
MCHC: 33.9 g/dL (ref 30.0–36.0)
MCV: 82.9 fL (ref 80.0–100.0)
Monocytes Absolute: 0.9 10*3/uL (ref 0.1–1.0)
Monocytes Relative: 13 %
Neutro Abs: 4.9 10*3/uL (ref 1.7–7.7)
Neutrophils Relative %: 71 %
Platelets: 353 10*3/uL (ref 150–400)
RBC: 4.49 MIL/uL (ref 4.22–5.81)
RDW: 15.5 % (ref 11.5–15.5)
WBC: 6.9 10*3/uL (ref 4.0–10.5)
nRBC: 0 % (ref 0.0–0.2)

## 2020-08-20 LAB — COMPREHENSIVE METABOLIC PANEL
ALT: 11 U/L (ref 0–44)
AST: 13 U/L — ABNORMAL LOW (ref 15–41)
Albumin: 3.9 g/dL (ref 3.5–5.0)
Alkaline Phosphatase: 81 U/L (ref 38–126)
Anion gap: 10 (ref 5–15)
BUN: 15 mg/dL (ref 8–23)
CO2: 25 mmol/L (ref 22–32)
Calcium: 8.8 mg/dL — ABNORMAL LOW (ref 8.9–10.3)
Chloride: 100 mmol/L (ref 98–111)
Creatinine, Ser: 0.6 mg/dL — ABNORMAL LOW (ref 0.61–1.24)
GFR calc Af Amer: >60 mL/min (ref >60)
GFR calc non Af Amer: >60 mL/min (ref >60)
Glucose, Bld: 126 mg/dL — ABNORMAL HIGH (ref 70–99)
Potassium: 3.9 mmol/L (ref 3.5–5.1)
Sodium: 135 mmol/L (ref 135–145)
Total Bilirubin: 0.4 mg/dL (ref 0.3–1.2)
Total Protein: 7.5 g/dL (ref 6.5–8.1)

## 2020-08-20 MED ORDER — PREDNISONE 10 MG (21) PO TBPK: ORAL_TABLET | ORAL | 0 refills | Status: DC

## 2020-08-20 MED ORDER — HEPARIN SOD (PORK) LOCK FLUSH 100 UNIT/ML IV SOLN
INTRAVENOUS | Status: AC
  Filled 2020-08-20: qty 5

## 2020-08-20 MED ORDER — SODIUM CHLORIDE 0.9% FLUSH
10.0000 mL | Freq: Once | INTRAVENOUS | Status: AC
  Administered 2020-08-20: 10 mL via INTRAVENOUS
  Filled 2020-08-20: qty 10

## 2020-08-20 MED ORDER — HEPARIN SOD (PORK) LOCK FLUSH 100 UNIT/ML IV SOLN
500.0000 [IU] | Freq: Once | INTRAVENOUS | Status: AC
  Administered 2020-08-20: 500 [IU] via INTRAVENOUS
  Filled 2020-08-20: qty 5

## 2020-08-20 MED ORDER — HYDROCODONE-HOMATROPINE 5-1.5 MG/5ML PO SYRP
5.0000 mL | ORAL_SOLUTION | Freq: Four times a day (QID) | ORAL | 0 refills | Status: DC | PRN
Start: 2020-08-20 — End: 2020-09-03

## 2020-08-20 MED ORDER — PANTOPRAZOLE SODIUM 40 MG PO TBEC: 40.0000 mg | DELAYED_RELEASE_TABLET | Freq: Every day | ORAL | 3 refills | Status: AC

## 2020-08-20 MED ORDER — SERTRALINE HCL 50 MG PO TABS: 50.0000 mg | ORAL_TABLET | Freq: Every day | ORAL | 3 refills | Status: DC

## 2020-08-20 MED ORDER — CLONAZEPAM 0.5 MG PO TABS: 0.5000 mg | ORAL_TABLET | Freq: Two times a day (BID) | ORAL | 0 refills | Status: DC | PRN

## 2020-08-20 NOTE — Telephone Encounter (Signed)
[name] discussed patient with Dr. Hedwig Morton and would like to prescribe prednisone taper pack.  Rx for prednisone has been sent to preferred pharmacy.  Patient has been made by Dr. Hedwig Morton. Nothing further is needed.

## 2020-08-20 NOTE — Telephone Encounter (Signed)
Patient sent mychart asking if he needs anything prior to his appt. Patient was supposed to get an MRI of abdomen. Looks like radiology tried to call patient but patient canceled apt for MRI and stated he would call back to reschedule. I left a message for the patient we need to reschedule his appt for Tuesday 08/25/2020 and reschedule after he gets his MRI.

## 2020-08-20 NOTE — Progress Notes (Signed)
[place]  [phone] [phone]   Name: Derrick Gallegos Date: 08/20/2020 MRN: [id number]  DOB: Mar 06, 1951  Patient Care Team: Center, [facility] as PCP - General ([place]) End, Harrell Gave, MD as PCP - Cardiology (Cardiology) Telford Nab, RN as Registered Nurse    REASON FOR CONSULTATION: Derrick Gallegos is a 69 y.o. male with multiple medical problems including CAD status post CABG, DM2, hyperthyroidism, and stage III lung cancer status post chemo/radiation and immunotherapy.  Patient has history of a right renal mass under surveillance.  He has had significant symptoms of anxiety and fatigue resulting in discontinuation of maintenance nivolumab.  CT of the chest on 08/17/2020 revealed patchy groundglass opacity concerning for inflammatory process versus multilobar pneumonia, bilateral pleural effusions, and new right hilar lymphadenopathy could be reactive or metastatic.  Patient was referred to palliative care to help address goals and manage ongoing symptoms.  SOCIAL HISTORY:     reports that he quit smoking about 17 years ago. His smoking use included cigarettes. He uses smokeless tobacco. He reports that he does not drink alcohol and does not use drugs.   Patient is married and lives at home with his wife.  He is followed and son who are involved.  Patient previously worked as a Curator  ADVANCE DIRECTIVES:    CODE STATUS:   PAST MEDICAL HISTORY: Past Medical History:  Diagnosis Date  . Anxiety   . Arthritis   . Atrial flutter ([place])    a. diagnosed 5/19; b. s/p TEE/DCCV 05/2018 by Dr. Humphrey Rolls; c. CHADS2VASc => 4 (HTN, age x1, DM, vascular disease)  . Complication of anesthesia    "wakes up too early"  . Coronary artery disease    a. s/p 3-v cabg in 2004 at [place] (LIMA and radial arery used - no further details in Care Everywhere); b. LHC 8/19 underlying multi-V dz, patent LIMA-LAD,  occluded VG-PDA, 90-95% stenosis of LCx s/p PCI/DES x 2  . Cough   . Depression   . Diabetes mellitus with complication ([place])   . Dyspnea   . Dysrhythmia    atrial fib.  . Essential hypertension   . Family history of premature CAD    a. father passed away from an MI at 64  . GERD (gastroesophageal reflux disease)   . Heart murmur   . HLD (hyperlipidemia)    a. statin intolerant   . Lung cancer ([place]) 09/2019   Chemo + Rad tx's.   . Mass of left lung   . MI (myocardial infarction) ([place])    2004  . Obesity     PAST SURGICAL HISTORY:  Past Surgical History:  Procedure Laterality Date  . CARDIAC CATHETERIZATION  2004  . CARDIAC CATHETERIZATION  2019   x2 stents [facility]  . CARDIAC SURGERY    . CARDIOVERSION N/A 06/20/2018   Procedure: CARDIOVERSION;  Surgeon: Dionisio David, MD;  Location: [facility] ORS;  Service: Cardiovascular;  Laterality: N/A;  . CORONARY ARTERY BYPASS GRAFT    . CORONARY STENT INTERVENTION N/A 08/08/2018   Procedure: CORONARY STENT INTERVENTION;  Surgeon: Isaias Cowman, MD;  Location: [place] [facility];  Service: Cardiovascular;  Laterality: N/A;  . LEFT HEART CATH AND CORONARY ANGIOGRAPHY Left 08/08/2018   Procedure: LEFT HEART CATH AND CORONARY ANGIOGRAPHY;  Surgeon: Dionisio David, MD;  Location: [place] [facility];  Service: Cardiovascular;  Laterality: Left;  . PORTA CATH INSERTION N/A 11/10/2019   Procedure:  PORTA CATH INSERTION;  Surgeon: Algernon Huxley, MD;  Location: [place];  Service: Cardiovascular;  Laterality: N/A;  . TEE WITHOUT CARDIOVERSION N/A 06/20/2018   Procedure: TRANSESOPHAGEAL ECHOCARDIOGRAM (TEE);  Surgeon: Dionisio David, MD;  Location: [facility] ORS;  Service: Cardiovascular;  Laterality: N/A;  . VIDEO BRONCHOSCOPY N/A 10/29/2019   Procedure: VIDEO BRONCHOSCOPY WITH ENDOBRONCHICAL ULTRASOUND;  Surgeon: Tyler Pita, MD;  Location: [facility] ORS;  Service: Cardiopulmonary;  Laterality: N/A;    HEMATOLOGY/ONCOLOGY HISTORY:    Oncology History  Malignant neoplasm of upper lobe of left lung ([place])  11/04/2019 Cancer Staging   Staging form: Lung, AJCC 8th Edition - Clinical stage from 11/04/2019: Stage IIIC (cT3, cN3, cM0) - Signed by Sindy Guadeloupe, MD on 11/06/2019   11/06/2019 Initial Diagnosis   Malignant neoplasm of upper lobe of left lung ([place])   11/17/2019 - 01/07/2020 Chemotherapy   The patient had palonosetron (ALOXI) injection 0.25 mg, 0.25 mg, Intravenous,  Once, 7 of 7 cycles Administration: 0.25 mg (11/17/2019), 0.25 mg (11/26/2019), 0.25 mg (12/04/2019), 0.25 mg (12/11/2019), 0.25 mg (12/18/2019), 0.25 mg (12/25/2019), 0.25 mg (01/01/2020) CARBOplatin (PARAPLATIN) 280 mg in sodium chloride 0.9 % 250 mL chemo infusion, 280 mg (100 % of original dose 276 mg), Intravenous,  Once, 7 of 7 cycles Dose modification:   (original dose 276 mg, Cycle 1) Administration: 280 mg (11/17/2019), 280 mg (11/26/2019), 280 mg (12/04/2019), 280 mg (12/11/2019), 280 mg (12/18/2019), 280 mg (12/25/2019), 280 mg (01/01/2020) PACLitaxel (TAXOL) 108 mg in sodium chloride 0.9 % 250 mL chemo infusion (</= 79m/m2), 45 mg/m2 = 108 mg, Intravenous,  Once, 7 of 7 cycles Administration: 108 mg (11/17/2019), 108 mg (11/26/2019), 108 mg (12/04/2019), 108 mg (12/11/2019), 108 mg (12/18/2019), 108 mg (12/25/2019), 108 mg (01/01/2020)  for chemotherapy treatment.    01/30/2020 -  Chemotherapy   The patient had durvalumab (IMFINZI) 1,120 mg in sodium chloride 0.9 % 100 mL chemo infusion, 10 mg/kg = 1,120 mg, Intravenous,  Once, 5 of 6 cycles Administration: 1,120 mg (01/30/2020), 1,120 mg (02/27/2020), 1,120 mg (03/12/2020), 1,100 mg (02/13/2020), 1,120 mg (03/26/2020)  for chemotherapy treatment.      ALLERGIES:  has No Known Allergies.  MEDICATIONS:  Current Outpatient Medications  Medication Sig Dispense Refill  . acetaminophen (TYLENOL) 500 MG tablet Take 1,000 mg by mouth every 6 (six) hours as needed for moderate pain.     . benzonatate  (TESSALON PERLES) 100 MG capsule Take 1 capsule (100 mg total) by mouth 3 (three) times daily as needed for cough. (Patient not taking: Reported on 08/20/2020) 20 capsule 0  . empagliflozin (JARDIANCE) 25 MG TABS tablet Take 25 mg by mouth daily.    .Marland Kitchenezetimibe (ZETIA) 10 MG tablet Take 1 tablet (10 mg total) by mouth daily. 90 tablet 0  . furosemide (LASIX) 20 MG tablet Take 1 tablet (20 mg total) by mouth daily. For one week (Patient not taking: Reported on 08/20/2020) 90 tablet 3  . gabapentin (NEURONTIN) 300 MG capsule Take 1 capsule (300 mg total) by mouth 3 (three) times daily. 90 capsule 3  . glipiZIDE (GLUCOTROL) 10 MG tablet Take 10 mg by mouth daily before breakfast.     . Glycopyrrolate-Formoterol (BEVESPI AEROSPHERE) 9-4.8 MCG/ACT AERO Inhale 2 puffs into the lungs 2 (two) times daily. (Patient not taking: Reported on 08/20/2020) 10.7 g 11  . HYDROcodone-homatropine (HYCODAN) 5-1.5 MG/5ML syrup Take 5 mLs by mouth every 6 (six) hours as needed for cough. 120 mL 0  .  LORazepam (ATIVAN) 1 MG tablet Take 1 tablet (1 mg total) by mouth 3 (three) times daily as needed for anxiety. 60 tablet 0  . losartan (COZAAR) 50 MG tablet Take 50 mg by mouth daily.     . methimazole (TAPAZOLE) 10 MG tablet Take 10 mg by mouth in the morning and at bedtime.    . metoprolol tartrate (LOPRESSOR) 25 MG tablet Take 25 mg by mouth at bedtime.    Marland Kitchen morphine (ROXANOL) 20 MG/ML concentrated solution Take 1 mL (20 mg total) by mouth every 4 (four) hours as needed for severe pain. 60 mL 0  . nitroGLYCERIN (NITROSTAT) 0.4 MG SL tablet Place 1 tablet (0.4 mg total) under the tongue every 5 (five) minutes as needed for chest pain. Maximum of 3 doses. 25 tablet 3  . Omega-3 Fatty Acids (FISH OIL) 1200 MG CAPS Take 2,400 mg by mouth at bedtime.  (Patient not taking: Reported on 08/20/2020)    . omeprazole (PRILOSEC) 40 MG capsule Take 1 capsule (40 mg total) by mouth daily. 30 capsule 6  . prochlorperazine (COMPAZINE) 10 MG  tablet Take 1 tablet (10 mg total) by mouth every 6 (six) hours as needed for nausea or vomiting. 30 tablet 0  . rivaroxaban (XARELTO) 20 MG TABS tablet Take 1 tablet (20 mg total) by mouth daily with supper. 90 tablet 2  . sertraline (ZOLOFT) 25 MG tablet Take 1 tablet (25 mg total) by mouth daily. 90 tablet 0   No current facility-administered medications for this visit.   Facility-Administered Medications Ordered in Other Visits  Medication Dose Route Frequency Provider Last Rate Last Admin  . sodium chloride flush (NS) 0.9 % injection 10 mL  10 mL Intravenous PRN Sindy Guadeloupe, MD   10 mL at 06/22/20 0851    VITAL SIGNS: There were no vitals taken for this visit. There were no vitals filed for this visit.  Estimated body mass index is 33.04 kg/m as calculated from the following:   Height as of 07/29/20: 5' 11"  (1.803 m).   Weight as of an earlier encounter on 08/20/20: 236 lb 14.4 oz (107.5 kg).  LABS: CBC:    Component Value Date/Time   WBC 6.9 08/20/2020 1305   HGB 12.6 (L) 08/20/2020 1305   HCT 37.2 (L) 08/20/2020 1305   PLT 353 08/20/2020 1305   MCV 82.9 08/20/2020 1305   NEUTROABS 4.9 08/20/2020 1305   LYMPHSABS 0.9 08/20/2020 1305   MONOABS 0.9 08/20/2020 1305   EOSABS 0.2 08/20/2020 1305   BASOSABS 0.0 08/20/2020 1305   Comprehensive Metabolic Panel:    Component Value Date/Time   NA 135 08/20/2020 1305   K 3.9 08/20/2020 1305   CL 100 08/20/2020 1305   CO2 25 08/20/2020 1305   BUN 15 08/20/2020 1305   CREATININE 0.60 (L) 08/20/2020 1305   GLUCOSE 126 (H) 08/20/2020 1305   CALCIUM 8.8 (L) 08/20/2020 1305   AST 13 (L) 08/20/2020 1305   ALT 11 08/20/2020 1305   ALKPHOS 81 08/20/2020 1305   BILITOT 0.4 08/20/2020 1305   BILITOT 0.4 12/23/2018 1012   PROT 7.5 08/20/2020 1305   PROT 7.3 12/23/2018 1012   ALBUMIN 3.9 08/20/2020 1305   ALBUMIN 4.8 12/23/2018 1012    RADIOGRAPHIC STUDIES: CT Chest W Contrast  Result Date: 08/18/2020 CLINICAL DATA:  Non  small cell left upper lobe lung cancer status post concurrent chemo radiation therapy with maintenance immunotherapy. Restaging. EXAM: CT CHEST WITH CONTRAST TECHNIQUE: Multidetector CT imaging of  the chest was performed during intravenous contrast administration. CONTRAST:  39m OMNIPAQUE IOHEXOL 300 MG/ML  SOLN COMPARISON:  05/14/2020 chest CT. FINDINGS: Cardiovascular: Normal heart size. No significant pericardial effusion/thickening. Three-vessel coronary atherosclerosis status post CABG. Right internal jugular Port-A-Cath terminates in middle third of the SVC. Atherosclerotic nonaneurysmal thoracic aorta. Normal caliber pulmonary arteries. No central pulmonary emboli. Mediastinum/Nodes: Subcentimeter hypodense posterior left thyroid nodule is stable. Not clinically significant; no follow-up imaging recommended (ref: J Am Coll Radiol. 2015 Feb;12(2): 143-50). Stable mild circumferential wall thickening in mid to lower thoracic esophagus. No axillary adenopathy. Mildly enlarged 1.6 cm right paratracheal node (series 2/image 52), decreased from 1.8 cm. Enlarged 1.5 cm subcarinal node (series 2/image 72), stable. Mildly enlarged 1.1 cm left paratracheal node (series 2/image 50), decreased from 1.3 cm. No new pathologically enlarged mediastinal nodes. Enlarged 1.6 cm right hilar node (series 2/image 65), new. No discrete left hilar adenopathy. Lungs/Pleura: No pneumothorax. Small dependent right pleural effusion, new. Small to moderate loculated left pleural effusion with mild left pleural thickening, mildly increased. Patchy ground-glass opacity, indistinct patchy vaguely nodular foci of consolidation and scattered interlobular septal thickening throughout the right lung, largely new, for example a 1.4 cm right middle lobe peripheral nodular focus of consolidation (series 3/image 110). New patchy ground-glass opacity, consolidation and interlobular septal thickening superimposed on dense sharply marginated left  perihilar consolidation, with worsening volume loss in the left hemithorax. Upper abdomen: No acute abnormality. Heterogeneously enhancing 3.9 cm anterior upper right renal mass, increased from 3.5 cm. Musculoskeletal: No aggressive appearing focal osseous lesions. Stable scattered discontinuities in the sternotomy wires. Moderate thoracic spondylosis. IMPRESSION: 1. Largely new patchy ground-glass opacity, interlobular septal thickening and vaguely nodular regions of peripheral consolidation throughout the right greater than left lungs bilaterally. Findings are nonspecific, favoring an inflammatory etiology such as drug reaction or multilobar pneumonia. Short-term chest CT follow-up advised. 2. These new pulmonary findings are superimposed on previously visualized sharply marginated dense left perihilar consolidation favored to represent post treatment change. 3. New small dependent right pleural effusion. Small to moderate loculated left pleural effusion with mild left pleural thickening, mildly increased. 4. Mediastinal lymphadenopathy is stable to decreased. 5. New nonspecific right hilar lymphadenopathy, which could be reactive or metastatic. 6. Increased size of 3.9 cm heterogeneously enhancing anterior upper right renal mass, compatible with renal cell carcinoma. 7. Aortic Atherosclerosis (ICD10-I70.0). Electronically Signed   By: JIlona SorrelM.D.   On: 08/18/2020 08:31    PERFORMANCE STATUS (ECOG) : 1 - Symptomatic but completely ambulatory  Review of Systems Unless otherwise noted, a complete review of systems is negative.  Physical Exam General: NAD Pulmonary: Unlabored Extremities: no edema, no joint deformities Skin: no rashes Neurological: Weakness but otherwise nonfocal  IMPRESSION: I met with patient and his daughter today.  They also saw Dr RJanese Bankstoday.  CT was concerning for inflammatory changes in the lungs.  Patient has been restarted on a course of steroids.  Patient says he  generally feels miserable.  He describes having significant periods of depression and anxiety.  He wakes in the middle the night and sometimes feeling panicked.  He describes insomnia.  He says that he initially had significant improvement in his symptoms with initiation of lorazepam but now feels it is too short-lived.  He was also started on Zoloft low-dose but cannot tell much improvement.  Patient is also taking gabapentin, which can help some with anxiety.  Patient denies SI/HI.  We discussed strategies for managing chronic dyspnea including cool  air temperatures, use of blowing air, and energy conservation.  It appears that his anxiety is exacerbating his dyspnea.  I discussed referral to psychiatry but patient refused.  We will plan to increase dose of Zoloft today.  Will rotate from lorazepam to clonazepam due to longer half-life.  Patient describes significant reflux and does not feel the omeprazole is helping.  Will rotate to Protonix.  PLAN: -Continue current scope of treatment -Recommend repeating therapeutic thoracentesis as needed -Increase Zoloft 50 mg daily -Could consider Wellbutrin as an augmenting agent want Zoloft is maximized -Discontinue lorazepam and switch to clonazepam 0.5 mg twice daily as needed -Continue gabapentin 300 mg 3 times daily -Refill Hycodan -Discontinue omeprazole and switch to Protonix 40 mg daily -MyChart visit in 3 to 4 weeks  Case and plan discussed with Dr. Janese Banks  Patient expressed understanding and was in agreement with this plan. He also understands that He can call the clinic at any time with any questions, concerns, or complaints.     Time Total: 20 minutes  Visit consisted of counseling and education dealing with the complex and emotionally intense issues of symptom management and palliative care in the setting of serious and potentially life-threatening illness.Greater than 50%  of this time was spent counseling and coordinating care  related to the above assessment and plan.  Signed by: Altha Harm, PhD, NP-C

## 2020-08-23 NOTE — Progress Notes (Signed)
Hematology/Oncology Consult note [facility] : [facility]  Telephone:([phone] [phone]  Patient Care Team: Center, [facility] as PCP - General ([place]) End, Harrell Gave, MD as PCP - Cardiology (Cardiology) Telford Nab, RN as Registered Nurse   Name of the patient: Derrick Gallegos  [id number]  [birth year]   Date of visit: 08/23/20  Diagnosis- Stage IIIC SCC lung [id number]   Chief complaint/ Reason for visit-discuss CT scan results and further management  Heme/Onc history: patient is a 69 year old malewho was a former smoker and smoked about 2 to 3 packs/day for many years and quit smoking a few years ago. He presented to the ER with symptoms of cough and shortness of breathWhich prompted [place] chest x-ray followed by a CT scan. CT scan showed a 3.4 x 3.2 cm left perihilar mass along the left paratracheal precarinal and subcarinal vascular and left hilar adenopathy.   PET CT scan showed a large left upper lobe lung mass measuring 6.3 x 3.4 cm. Bilateral mediastinal adenopathy as well as left supraclavicular adenopathy. 3.4 cm partially exophytic lesion of the right kidney. No evidence of distant metastatic disease.  Patient has completed concurrent chemo/RT. Scans showe dpartial response. Maintenance durvalumab started in feb 2021  Patient has met with Dr. Erlene Quan for right renal mass which is being monitored for now and consideration for cryoablation in the future  Patienta will have multiple symptoms including anxiety fatigue and problems with balance after starting maintenance durvalumab which has been on hold since 03/26/2020.  He was also diagnosed with hyperthyroidism and follows up with endocrinology and is currently on methimazole.  Interval history-patient continues to feel fatigued.  He gets recurrent bouts of cough during the day which can last for close to a minute and makes him feel short of breath.  Still reports  feeling depressed despite using Zoloft.  He has been using as needed lorazepam for his anxiety but is not helping as much.  Overall he feels miserable.  Has trouble sleeping at night.  ECOG PS- 1 Pain scale- 3 Opioid associated constipation- no  Review of systems- Review of Systems  Constitutional: Positive for malaise/fatigue. Negative for chills, fever and weight loss.  HENT: Negative for congestion, ear discharge and nosebleeds.   Eyes: Negative for blurred vision.  Respiratory: Positive for cough and shortness of breath. Negative for hemoptysis, sputum production and wheezing.   Cardiovascular: Negative for chest pain, palpitations, orthopnea and claudication.  Gastrointestinal: Negative for abdominal pain, blood in stool, constipation, diarrhea, heartburn, melena, nausea and vomiting.  Genitourinary: Negative for dysuria, flank pain, frequency, hematuria and urgency.  Musculoskeletal: Negative for back pain, joint pain and myalgias.  Skin: Negative for rash.  Neurological: Negative for dizziness, tingling, focal weakness, seizures, weakness and headaches.  Endo/Heme/Allergies: Does not bruise/bleed easily.  Psychiatric/Behavioral: Positive for depression. Negative for suicidal ideas. The patient is nervous/anxious. The patient does not have insomnia.       No Known Allergies   Past Medical History:  Diagnosis Date  . Anxiety   . Arthritis   . Atrial flutter ([place])    a. diagnosed 5/19; b. s/p TEE/DCCV 05/2018 by Dr. Humphrey Rolls; c. CHADS2VASc => 4 (HTN, age x1, DM, vascular disease)  . Complication of anesthesia    "wakes up too early"  . Coronary artery disease    a. s/p 3-v cabg in 2004 at [place] (LIMA and radial arery used - no further details in Care Everywhere); b. LHC 8/19 underlying multi-V dz, patent  LIMA-LAD, occluded VG-PDA, 90-95% stenosis of LCx s/p PCI/DES x 2  . Cough   . Depression   . Diabetes mellitus with complication ([place])   . Dyspnea   . Dysrhythmia    atrial  fib.  . Essential hypertension   . Family history of premature CAD    a. father passed away from an MI at 27  . GERD (gastroesophageal reflux disease)   . Heart murmur   . HLD (hyperlipidemia)    a. statin intolerant   . Lung cancer ([place]) 09/2019   Chemo + Rad tx's.   . Mass of left lung   . MI (myocardial infarction) ([place])    2004  . Obesity      Past Surgical History:  Procedure Laterality Date  . CARDIAC CATHETERIZATION  2004  . CARDIAC CATHETERIZATION  2019   x2 stents [facility]  . CARDIAC SURGERY    . CARDIOVERSION N/A 06/20/2018   Procedure: CARDIOVERSION;  Surgeon: Dionisio David, MD;  Location: [facility] ORS;  Service: Cardiovascular;  Laterality: N/A;  . CORONARY ARTERY BYPASS GRAFT    . CORONARY STENT INTERVENTION N/A 08/08/2018   Procedure: CORONARY STENT INTERVENTION;  Surgeon: Isaias Cowman, MD;  Location: [place] [facility];  Service: Cardiovascular;  Laterality: N/A;  . LEFT HEART CATH AND CORONARY ANGIOGRAPHY Left 08/08/2018   Procedure: LEFT HEART CATH AND CORONARY ANGIOGRAPHY;  Surgeon: Dionisio David, MD;  Location: [place] [facility];  Service: Cardiovascular;  Laterality: Left;  . PORTA CATH INSERTION N/A 11/10/2019   Procedure: PORTA CATH INSERTION;  Surgeon: Algernon Huxley, MD;  Location: [place] [facility];  Service: Cardiovascular;  Laterality: N/A;  . TEE WITHOUT CARDIOVERSION N/A 06/20/2018   Procedure: TRANSESOPHAGEAL ECHOCARDIOGRAM (TEE);  Surgeon: Dionisio David, MD;  Location: [facility] ORS;  Service: Cardiovascular;  Laterality: N/A;  . VIDEO BRONCHOSCOPY N/A 10/29/2019   Procedure: VIDEO BRONCHOSCOPY WITH ENDOBRONCHICAL ULTRASOUND;  Surgeon: Tyler Pita, MD;  Location: [facility] ORS;  Service: Cardiopulmonary;  Laterality: N/A;    Social History   Socioeconomic History  . Marital status: Married    Spouse name: Not on file  . Number of children: Not on file  . Years of education: Not on file  . Highest education level: Not on file    Occupational History  . Not on file  Tobacco Use  . Smoking status: Former Smoker    Types: Cigarettes    Quit date: 2004    Years since quitting: 17.6  . Smokeless tobacco: Current User  . Tobacco comment: Quit 2004  Vaping Use  . Vaping Use: Never used  Substance and Sexual Activity  . Alcohol use: No  . Drug use: No  . Sexual activity: Not Currently  Other Topics Concern  . Not on file  Social History Narrative  . Not on file   Social Determinants of Health   Financial Resource Strain:   . Difficulty of Paying Living Expenses: Not on file  Food Insecurity:   . Worried About Charity fundraiser in the Last Year: Not on file  . Ran Out of Food in the Last Year: Not on file  Transportation Needs:   . Lack of Transportation (Medical): Not on file  . Lack of Transportation (Non-Medical): Not on file  Physical Activity:   . Days of Exercise per Week: Not on file  . Minutes of Exercise per Session: Not on file  Stress:   . Feeling of Stress : Not on file  Social Connections:   . Frequency of Communication with Friends and Family: Not on file  . Frequency of Social Gatherings with Friends and Family: Not on file  . Attends Religious Services: Not on file  . Active Member of Clubs or Organizations: Not on file  . Attends Archivist Meetings: Not on file  . Marital Status: Not on file  Intimate Partner Violence:   . Fear of Current or Ex-Partner: Not on file  . Emotionally Abused: Not on file  . Physically Abused: Not on file  . Sexually Abused: Not on file    Family History  Problem Relation Age of Onset  . CAD Mother   . CAD Father        a. MI at 57-->death  . CAD Brother      Current Outpatient Medications:  .  acetaminophen (TYLENOL) 500 MG tablet, Take 1,000 mg by mouth every 6 (six) hours as needed for moderate pain. , Disp: , Rfl:  .  empagliflozin (JARDIANCE) 25 MG TABS tablet, Take 25 mg by mouth daily., Disp: , Rfl:  .  ezetimibe (ZETIA) 10  MG tablet, Take 1 tablet (10 mg total) by mouth daily., Disp: 90 tablet, Rfl: 0 .  gabapentin (NEURONTIN) 300 MG capsule, Take 1 capsule (300 mg total) by mouth 3 (three) times daily., Disp: 90 capsule, Rfl: 3 .  glipiZIDE (GLUCOTROL) 10 MG tablet, Take 10 mg by mouth daily before breakfast. , Disp: , Rfl:  .  losartan (COZAAR) 50 MG tablet, Take 50 mg by mouth daily. , Disp: , Rfl:  .  methimazole (TAPAZOLE) 10 MG tablet, Take 10 mg by mouth in the morning and at bedtime., Disp: , Rfl:  .  metoprolol tartrate (LOPRESSOR) 25 MG tablet, Take 25 mg by mouth at bedtime., Disp: , Rfl:  .  morphine (ROXANOL) 20 MG/ML concentrated solution, Take 1 mL (20 mg total) by mouth every 4 (four) hours as needed for severe pain., Disp: 60 mL, Rfl: 0 .  nitroGLYCERIN (NITROSTAT) 0.4 MG SL tablet, Place 1 tablet (0.4 mg total) under the tongue every 5 (five) minutes as needed for chest pain. Maximum of 3 doses., Disp: 25 tablet, Rfl: 3 .  prochlorperazine (COMPAZINE) 10 MG tablet, Take 1 tablet (10 mg total) by mouth every 6 (six) hours as needed for nausea or vomiting., Disp: 30 tablet, Rfl: 0 .  rivaroxaban (XARELTO) 20 MG TABS tablet, Take 1 tablet (20 mg total) by mouth daily with supper., Disp: 90 tablet, Rfl: 2 .  benzonatate (TESSALON PERLES) 100 MG capsule, Take 1 capsule (100 mg total) by mouth 3 (three) times daily as needed for cough. (Patient not taking: Reported on 08/20/2020), Disp: 20 capsule, Rfl: 0 .  clonazePAM (KLONOPIN) 0.5 MG tablet, Take 1 tablet (0.5 mg total) by mouth 2 (two) times daily as needed for anxiety., Disp: 60 tablet, Rfl: 0 .  furosemide (LASIX) 20 MG tablet, Take 1 tablet (20 mg total) by mouth daily. For one week (Patient not taking: Reported on 08/20/2020), Disp: 90 tablet, Rfl: 3 .  Glycopyrrolate-Formoterol (BEVESPI AEROSPHERE) 9-4.8 MCG/ACT AERO, Inhale 2 puffs into the lungs 2 (two) times daily. (Patient not taking: Reported on 08/20/2020), Disp: 10.7 g, Rfl: 11 .   HYDROcodone-homatropine (HYCODAN) 5-1.5 MG/5ML syrup, Take 5 mLs by mouth every 6 (six) hours as needed for cough., Disp: 120 mL, Rfl: 0 .  Omega-3 Fatty Acids (FISH OIL) 1200 MG CAPS, Take 2,400 mg by mouth at bedtime.  (Patient not  taking: Reported on 08/20/2020), Disp: , Rfl:  .  pantoprazole (PROTONIX) 40 MG tablet, Take 1 tablet (40 mg total) by mouth daily., Disp: 30 tablet, Rfl: 3 .  predniSONE (STERAPRED UNI-PAK 21 TAB) 10 MG (21) TBPK tablet, Use as directed, Disp: 21 each, Rfl: 0 .  sertraline (ZOLOFT) 50 MG tablet, Take 1 tablet (50 mg total) by mouth daily., Disp: 30 tablet, Rfl: 3 No current facility-administered medications for this visit.  Facility-Administered Medications Ordered in Other Visits:  .  sodium chloride flush (NS) 0.9 % injection 10 mL, 10 mL, Intravenous, PRN, Sindy Guadeloupe, MD, 10 mL at 06/22/20 0851  Physical exam:  Vitals:   08/20/20 1329  BP: 120/82  Pulse: 83  Resp: 16  Temp: (!) 96.9 F (36.1 C)  TempSrc: Tympanic  SpO2: 95%  Weight: 236 lb 14.4 oz (107.5 kg)   Physical Exam Constitutional:      Comments: Appears fatigued  Cardiovascular:     Rate and Rhythm: Normal rate and regular rhythm.     Heart sounds: Normal heart sounds.  Pulmonary:     Effort: Pulmonary effort is normal.     Breath sounds: Normal breath sounds.  Abdominal:     General: Bowel sounds are normal.     Palpations: Abdomen is soft.  Skin:    General: Skin is warm and dry.  Neurological:     Mental Status: He is alert and oriented to person, place, and time.      CMP Latest Ref Rng & Units 08/20/2020  Glucose 70 - 99 mg/dL 126(H)  BUN 8 - 23 mg/dL 15  Creatinine 0.61 - 1.24 mg/dL 0.60(L)  Sodium 135 - 145 mmol/L 135  Potassium 3.5 - 5.1 mmol/L 3.9  Chloride 98 - 111 mmol/L 100  CO2 22 - 32 mmol/L 25  Calcium 8.9 - 10.3 mg/dL 8.8(L)  Total Protein 6.5 - 8.1 g/dL 7.5  Total Bilirubin 0.3 - 1.2 mg/dL 0.4  Alkaline Phos 38 - 126 U/L 81  AST 15 - 41 U/L 13(L)  ALT  0 - 44 U/L 11   CBC Latest Ref Rng & Units 08/20/2020  WBC 4.0 - 10.5 K/uL 6.9  Hemoglobin 13.0 - 17.0 g/dL 12.6(L)  Hematocrit 39 - 52 % 37.2(L)  Platelets 150 - 400 K/uL 353    No images are attached to the encounter.  CT Chest W Contrast  Result Date: 08/18/2020 CLINICAL DATA:  Non small cell left upper lobe lung cancer status post concurrent chemo radiation therapy with maintenance immunotherapy. Restaging. EXAM: CT CHEST WITH CONTRAST TECHNIQUE: Multidetector CT imaging of the chest was performed during intravenous contrast administration. CONTRAST:  6m OMNIPAQUE IOHEXOL 300 MG/ML  SOLN COMPARISON:  05/14/2020 chest CT. FINDINGS: Cardiovascular: Normal heart size. No significant pericardial effusion/thickening. Three-vessel coronary atherosclerosis status post CABG. Right internal jugular Port-A-Cath terminates in middle third of the SVC. Atherosclerotic nonaneurysmal thoracic aorta. Normal caliber pulmonary arteries. No central pulmonary emboli. Mediastinum/Nodes: Subcentimeter hypodense posterior left thyroid nodule is stable. Not clinically significant; no follow-up imaging recommended (ref: J Am Coll Radiol. 2015 Feb;12(2): 143-50). Stable mild circumferential wall thickening in mid to lower thoracic esophagus. No axillary adenopathy. Mildly enlarged 1.6 cm right paratracheal node (series 2/image 52), decreased from 1.8 cm. Enlarged 1.5 cm subcarinal node (series 2/image 72), stable. Mildly enlarged 1.1 cm left paratracheal node (series 2/image 50), decreased from 1.3 cm. No new pathologically enlarged mediastinal nodes. Enlarged 1.6 cm right hilar node (series 2/image 65), new. No discrete left  hilar adenopathy. Lungs/Pleura: No pneumothorax. Small dependent right pleural effusion, new. Small to moderate loculated left pleural effusion with mild left pleural thickening, mildly increased. Patchy ground-glass opacity, indistinct patchy vaguely nodular foci of consolidation and scattered  interlobular septal thickening throughout the right lung, largely new, for example a 1.4 cm right middle lobe peripheral nodular focus of consolidation (series 3/image 110). New patchy ground-glass opacity, consolidation and interlobular septal thickening superimposed on dense sharply marginated left perihilar consolidation, with worsening volume loss in the left hemithorax. Upper abdomen: No acute abnormality. Heterogeneously enhancing 3.9 cm anterior upper right renal mass, increased from 3.5 cm. Musculoskeletal: No aggressive appearing focal osseous lesions. Stable scattered discontinuities in the sternotomy wires. Moderate thoracic spondylosis. IMPRESSION: 1. Largely new patchy ground-glass opacity, interlobular septal thickening and vaguely nodular regions of peripheral consolidation throughout the right greater than left lungs bilaterally. Findings are nonspecific, favoring an inflammatory etiology such as drug reaction or multilobar pneumonia. Short-term chest CT follow-up advised. 2. These new pulmonary findings are superimposed on previously visualized sharply marginated dense left perihilar consolidation favored to represent post treatment change. 3. New small dependent right pleural effusion. Small to moderate loculated left pleural effusion with mild left pleural thickening, mildly increased. 4. Mediastinal lymphadenopathy is stable to decreased. 5. New nonspecific right hilar lymphadenopathy, which could be reactive or metastatic. 6. Increased size of 3.9 cm heterogeneously enhancing anterior upper right renal mass, compatible with renal cell carcinoma. 7. Aortic Atherosclerosis (ICD10-I70.0). Electronically Signed   By: Ilona Sorrel M.D.   On: 08/18/2020 08:31     Assessment and plan- Patient is a 69 y.o. male with stage III lung cancer s/p concurrent chemoradiation and was on maintenance durvalumab which has been on hold since April 2021.  He is here to discuss CT scan results and further  management  I have reviewed CT chest pelvis images independently and discussed findings with the patient and his daughter.  I have also discussed this case with [name].  Patient's immunotherapy has been on hold since April 2021.  Present scans do not show any overt evidence of disease progression.  Therefore I do not see any need to restart immunotherapy at this time.  Patient continues to have multiple physical complaints including insomnia depression and anxiety as well as fatigue cough and shortness of breath.  For his depression we will increase the dose of Zoloft to 50 mg daily.    Cough: He will also continue to use gabapentin 300 mg 3 times a day along with Hycodan which he will continue.  Anxiety: He has also seen NP Altha Harm today and plan is to switch him from her lorazepam to clonazepam.  Shortness of breath: Etiology is unclear.  No overt evidence of pneumonitis.  He has received antibiotic courses in the past for possible pneumonia which has not helped.  Given the possibility inflammatory findings with groundglass opacities in bilateral lungs, plan is to start him on a steroid taper which will be directed by pulmonary and he has a follow-up with their clinic thereafter.He has also had prior thoracentesis and cytology has been negative for malignancy.  Pathology showed mixed inflammatory cells including neutrophils, eosinophils and lymphocytes  Renal cell carcinoma: Currently being monitored    I will see him in 3 months with CBC with differential CMP and CT chest abdomen and pelvis with contrast prior   Visit Diagnosis 1. Malignant neoplasm of upper lobe of left lung (HCC)      [name], MD,  [facility] [phone] 08/23/2020 2:44 PM

## 2020-08-25 ENCOUNTER — Ambulatory Visit: Payer: Medicare Other | Admitting: Urology

## 2020-08-31 ENCOUNTER — Ambulatory Visit: Payer: Medicare Other

## 2020-09-01 LAB — ACID FAST CULTURE WITH REFLEXED SENSITIVITIES (MYCOBACTERIA): Acid Fast Culture: NEGATIVE

## 2020-09-03 ENCOUNTER — Encounter: Payer: Self-pay | Admitting: *Deleted

## 2020-09-03 ENCOUNTER — Other Ambulatory Visit: Payer: Self-pay | Admitting: *Deleted

## 2020-09-03 MED ORDER — MORPHINE SULFATE (CONCENTRATE) 20 MG/ML PO SOLN
20.0000 mg | ORAL | 0 refills | Status: DC | PRN
Start: 2020-09-03 — End: 2020-09-30

## 2020-09-03 MED ORDER — HYDROCODONE-HOMATROPINE 5-1.5 MG/5ML PO SYRP
5.0000 mL | ORAL_SOLUTION | Freq: Four times a day (QID) | ORAL | 0 refills | Status: DC | PRN
Start: 2020-09-03 — End: 2020-09-14

## 2020-09-06 ENCOUNTER — Encounter: Payer: Self-pay | Admitting: Urology

## 2020-09-07 ENCOUNTER — Encounter: Payer: Self-pay | Admitting: Pulmonary Disease

## 2020-09-07 ENCOUNTER — Ambulatory Visit: Payer: Medicare Other | Admitting: Oncology

## 2020-09-07 ENCOUNTER — Ambulatory Visit
Admission: RE | Admit: 2020-09-07 | Discharge: 2020-09-07 | Disposition: A | Discharge status: Home / Self Care | Payer: Medicare Other | Attending: Pulmonary Disease | Admitting: Pulmonary Disease

## 2020-09-07 ENCOUNTER — Ambulatory Visit
Admission: RE | Admit: 2020-09-07 | Discharge: 2020-09-07 | Disposition: A | Discharge status: Home / Self Care | Payer: Medicare Other | Source: Ambulatory Visit | Attending: Pulmonary Disease | Admitting: Pulmonary Disease

## 2020-09-07 ENCOUNTER — Ambulatory Visit (INDEPENDENT_AMBULATORY_CARE_PROVIDER_SITE_OTHER): Payer: Medicare Other | Admitting: Pulmonary Disease

## 2020-09-07 ENCOUNTER — Other Ambulatory Visit: Payer: Self-pay

## 2020-09-07 ENCOUNTER — Other Ambulatory Visit
Admission: RE | Admit: 2020-09-07 | Discharge: 2020-09-07 | Disposition: A | Discharge status: Home / Self Care | Payer: Medicare Other | Source: Home / Self Care | Attending: Pulmonary Disease | Admitting: Pulmonary Disease

## 2020-09-07 ENCOUNTER — Other Ambulatory Visit: Payer: Medicare Other

## 2020-09-07 VITALS — BP 124/80 | HR 100 | Temp 97.3°F | Ht 71.0 in | Wt 234.2 lb

## 2020-09-07 DIAGNOSIS — C3402 Malignant neoplasm of left main bronchus: Secondary | ICD-10-CM | POA: Diagnosis present

## 2020-09-07 DIAGNOSIS — J90 Pleural effusion, not elsewhere classified: Secondary | ICD-10-CM | POA: Diagnosis not present

## 2020-09-07 DIAGNOSIS — C3412 Malignant neoplasm of upper lobe, left bronchus or lung: Secondary | ICD-10-CM

## 2020-09-07 DIAGNOSIS — J449 Chronic obstructive pulmonary disease, unspecified: Secondary | ICD-10-CM

## 2020-09-07 DIAGNOSIS — R0602 Shortness of breath: Secondary | ICD-10-CM

## 2020-09-07 LAB — CBC WITH DIFFERENTIAL/PLATELET
Abs Immature Granulocytes: 0.05 10*3/uL (ref 0.00–0.07)
Basophils Absolute: 0 10*3/uL (ref 0.0–0.1)
Basophils Relative: 1 %
Eosinophils Absolute: 0.1 10*3/uL (ref 0.0–0.5)
Eosinophils Relative: 1 %
HCT: 41.4 % (ref 39.0–52.0)
Hemoglobin: 13.7 g/dL (ref 13.0–17.0)
Immature Granulocytes: 1 %
Lymphocytes Relative: 11 %
Lymphs Abs: 0.9 10*3/uL (ref 0.7–4.0)
MCH: 27.8 pg (ref 26.0–34.0)
MCHC: 33.1 g/dL (ref 30.0–36.0)
MCV: 84.1 fL (ref 80.0–100.0)
Monocytes Absolute: 0.9 10*3/uL (ref 0.1–1.0)
Monocytes Relative: 10 %
Neutro Abs: 6.6 10*3/uL (ref 1.7–7.7)
Neutrophils Relative %: 76 %
Platelets: 315 10*3/uL (ref 150–400)
RBC: 4.92 MIL/uL (ref 4.22–5.81)
RDW: 16.1 % — ABNORMAL HIGH (ref 11.5–15.5)
WBC: 8.6 10*3/uL (ref 4.0–10.5)
nRBC: 0 % (ref 0.0–0.2)

## 2020-09-07 LAB — BRAIN NATRIURETIC PEPTIDE: B Natriuretic Peptide: 75.5 pg/mL (ref 0.0–100.0)

## 2020-09-07 LAB — COMPREHENSIVE METABOLIC PANEL
ALT: 11 U/L (ref 0–44)
AST: 14 U/L — ABNORMAL LOW (ref 15–41)
Albumin: 3.7 g/dL (ref 3.5–5.0)
Alkaline Phosphatase: 82 U/L (ref 38–126)
Anion gap: 13 (ref 5–15)
BUN: 13 mg/dL (ref 8–23)
CO2: 25 mmol/L (ref 22–32)
Calcium: 9.3 mg/dL (ref 8.9–10.3)
Chloride: 97 mmol/L — ABNORMAL LOW (ref 98–111)
Creatinine, Ser: 0.77 mg/dL (ref 0.61–1.24)
GFR calc Af Amer: >60 mL/min (ref >60)
GFR calc non Af Amer: >60 mL/min (ref >60)
Glucose, Bld: 172 mg/dL — ABNORMAL HIGH (ref 70–99)
Potassium: 3.9 mmol/L (ref 3.5–5.1)
Sodium: 135 mmol/L (ref 135–145)
Total Bilirubin: 0.6 mg/dL (ref 0.3–1.2)
Total Protein: 7.6 g/dL (ref 6.5–8.1)

## 2020-09-07 MED ORDER — IPRATROPIUM-ALBUTEROL 0.5-2.5 (3) MG/3ML IN SOLN: 3.0000 mL | Freq: Four times a day (QID) | RESPIRATORY_TRACT | 3 refills | Status: AC | PRN

## 2020-09-07 NOTE — Patient Instructions (Addendum)
You were seen today by Lauraine Rinne, NP  for:   1. Shortness of breath 2. Pleural effusion  - Pulmonary function test; Future - DG Chest 2 View; Future  We will obtain a chest x-ray today as well as lab work  I suspect she likely will be requiring a additional therapeutic thoracentesis  We will have to discuss this with oncology as well as cardiology since you are on Xarelto  I will also discuss with cardiology whether or not they believe you would benefit from being on maintenance Lasix based off your kidney functioning that we will obtain today from lab work  3. Malignant neoplasm of hilus of left lung (HCC)  Keep follow-up next week with oncology  We will notify them of our findings today as well as route our office note to them  4. COPD suggested by initial evaluation ([place])  - Pulmonary function test; Future  We will order a nebulizer as well as scheduled DuoNeb nebulized medication you can use this every 6 hours as will be an replacement of the inhalers you have previously been trialed on we will also order a pulmonary function test to further evaluate your breathing as well as to confirm whether or not you have COPD which is what we suspect  We recommend today:  Orders Placed This Encounter  Procedures  . DG Chest 2 View    Standing Status:   Future    Standing Expiration Date:   01/07/2021    Order Specific Question:   Reason for Exam (SYMPTOM  OR DIAGNOSIS REQUIRED)    Answer:   pleural effusion    Order Specific Question:   Preferred imaging location?    Answer:   [facility]    Order Specific Question:   Radiology Contrast Protocol - do NOT remove file path    Answer:   [email or web address]  . Pulmonary function test    Standing Status:   Future    Standing Expiration Date:   09/07/2021    Order Specific Question:   Where should this test be performed?    Answer:   [facility]    Order Specific  Question:   Full PFT: includes the following: basic spirometry, spirometry pre & post bronchodilator, diffusion capacity (DLCO), lung volumes    Answer:   Full PFT   Orders Placed This Encounter  Procedures  . DG Chest 2 View  . Pulmonary function test   No orders of the defined types were placed in this encounter.   Follow Up:    Return in about 4 weeks (around 10/05/2020), or if symptoms worsen or fail to improve, for [facility] - [name].  4 to 6-week follow-up with [name].  If nothing available please send message to her to see if a held slot can be used  Notification of test results are managed in the following manner: If there are  any recommendations or changes to the  plan of care discussed in office today,  we will contact you and let you know what they are. If you do not hear from [place], then your results are normal and you can view them through your  MyChart account , or a letter will be sent to you. Thank you again for trusting [place] with your care  - Thank you, [facility]    It is flu season:   >>> Best ways to protect herself from the flu: Receive the yearly flu vaccine, practice good hand hygiene washing  with soap and also using hand sanitizer when available, eat a nutritious meals, get adequate rest, hydrate appropriately       Please contact the office if your symptoms worsen or you have concerns that you are not improving.   Thank you for choosing [facility] for your healthcare, and for allowing [place] to partner with you on your healthcare journey. I am thankful to be able to provide care to you today.   Wyn Quaker FNP-C

## 2020-09-07 NOTE — Assessment & Plan Note (Signed)
Suspect patient shortness of breath is likely multifactorial given his stage III squamous cell carcinoma, suspected COPD, known pleural effusions.  Plan: Lab work today Chest x-ray today May need to consider therapeutic thoracentesis based off of chest x-ray Patient also may benefit from maintenance diuretic, will check lab work and kidney functioning today and can discuss with cardiology

## 2020-09-07 NOTE — Assessment & Plan Note (Signed)
Plan: Continue follow-up with oncology Chest x-ray today Lab work today

## 2020-09-07 NOTE — Assessment & Plan Note (Signed)
Suspected COPD Former smoker Continued dyspnea Struggled with mobilizing DPI Did not feel much clinical relief with Bevespi  Plan: We will order nebulizer today We will order DuoNeb scheduled nebulized medication We will order pulmonary function testing Chest x-ray today

## 2020-09-07 NOTE — Assessment & Plan Note (Addendum)
Most recent CT shows a right pleural effusion as well as a potentially left loculated pleural effusion Status post thoracentesis in July/2021 which provide symptomatic relief Diminished breath sounds in the bases Patient reporting worsened orthopnea Patient with stage III left lung cancer  Plan: Chest x-ray today Lab work today We will need to route chart to cardiology to see if patient may be able to proceed forward with a thoracentesis and receive further instructions of patient's Xarelto We will route note to oncology as patient may need therapeutic thoracentesis and may need to be considered a candidate for a Pleurx drain in the future

## 2020-09-07 NOTE — Progress Notes (Signed)
_0  ID: Derrick Gallegos, male    DOB: [birth year], 69 y.o.   MRN: [id number]  Chief Complaint  Patient presents with   Follow-up    Patient states that when he was taking the prednisone his cough went away and he was breathing better and then about 2 days letter he started having trouble breathing again. Patient had Thora on 7/26 and felt a little better.     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 Pt of: [name]  09/07/2020  - Visit   69 year old male former smoker followed in our office for shortness of breath and malignant neoplasm of upper lobe of left lung.  He is followed by [name].Last seen in our office in July/2021 plan of care at that office visit was for the patient to trial Trelegy Ellipta.  Cough was felt to be residual lung scarring post chemoradiation it was requested that he obtain pulmonary function testing but he wanted to hold off on this.  He was also encouraged to keep follow-up with oncology for stage III squamous cell carcinoma status post chemoradiation.  He was recommended to have follow-up in 3 to 4 weeks with [name].  Patient's most recent CT of chest in August/2021 shows potentially inflammatory etiology or multilobular pneumonia.  Patient was seen by oncology NP on 08/20/2020.  Assessment and plan from that visit is listed below:  IMPRESSION: I met with patient and his daughter today.  They also saw [name] today.  CT was concerning for inflammatory changes in the lungs.  Patient has been restarted on a course of steroids.  Patient says he generally feels miserable.  He describes having significant periods of depression and anxiety.  He wakes in the  middle the night and sometimes feeling panicked.  He describes insomnia.  He says that he initially had significant improvement in his symptoms with initiation of lorazepam but now feels it is too short-lived.  He was also started on Zoloft low-dose but cannot tell much improvement.  Patient is also taking gabapentin, which can help some with anxiety.  Patient denies SI/HI.  We discussed strategies for managing chronic dyspnea including cool air temperatures, use of blowing air, and energy conservation.  It appears that his anxiety is exacerbating his dyspnea.  I discussed referral to psychiatry but patient refused.  We will plan to increase dose of Zoloft today.  Will rotate from lorazepam to clonazepam due to longer half-life.  Patient describes significant reflux and does not feel the omeprazole is helping.  Will rotate to Protonix.  PLAN: -Continue current scope of treatment -Recommend repeating therapeutic thoracentesis as needed -Increase Zoloft 50 mg daily -Could consider Wellbutrin as an augmenting agent want Zoloft is maximized -Discontinue lorazepam and switch to clonazepam 0.5 mg twice daily as needed -Continue gabapentin 300 mg 3 times daily -Refill Hycodan -Discontinue omeprazole and switch to Protonix 40 mg daily -MyChart visit in 3 to 4 weeks  Case and plan discussed with Dr. Janese Banks  Patient expressed understanding and was in agreement with this plan. He also understands that He can call the clinic at any time with any questions, concerns, or complaints.   Patient reports that he finishes prednisone taper. He did feel that it helped. He had  previously tried [employer] but he struggled with mobilizing the dry powder. He was not sure if the Bevespi or the Trelegy Ellipta ever truly helped. He feels the Combivent which is his wife's works the best. He reports today that he is willing to complete a pulmonary function test. We will work to coordinate this.  Patient has  had acute worsening shortness of breath over the past 4 to 6 weeks. He reports the shortness of breath is worsened over the last 2 weeks. He is having more short of breath when laying flat. Also some occasional wheezing when at rest. He does not have a nebulizer at home. He is unsure but he feels that may be the pleural effusions that he has had have worsened. We will discuss and evaluate this today.   Questionaires / Pulmonary Flowsheets:   ACT:  No flowsheet data found.  MMRC: mMRC Dyspnea Scale mMRC Score  09/07/2020 3    Epworth:  No flowsheet data found.  Tests:   05/14/2020-CT chest with contrast-new small left pleural effusion associated atelectasis or consolidation, significantly increased, somewhat bandlike consolidation  07/19/2020-left thoracentesis-approximately 600 mL of dark bloody fluid was removed  08/18/2020-CT chest with contrast-largely new patchy groundglass opacity, interlobular septal thickening and vaguely nodular regions of peripheral consolidation findings are nonspecific and favor an inflammatory etiology such as drug reaction or multilobular pneumonia, short-term chest CT follow-up advised, new pulmonary findings are superimposed on previously visualized sharply marginated dense left perihilar consolidation favored to represent posttreatment change, new small dependent right pleural effusion, small to moderate loculated left pleural effusion with mild left pleural thickening, mediastinal lymphadenopathy,  FENO:  No results found for: NITRICOXIDE  PFT: No flowsheet data found.  WALK:  SIX MIN WALK 07/01/2020 11/07/2019  Supplimental Oxygen during Test? (L/min) No No  Tech Comments: moderate pace- sob -    Imaging: CT Chest W Contrast  Result Date: 08/18/2020 CLINICAL DATA:  Non small cell left upper lobe lung cancer status post concurrent chemo radiation therapy with maintenance immunotherapy. Restaging. EXAM: CT CHEST WITH CONTRAST TECHNIQUE: Multidetector  CT imaging of the chest was performed during intravenous contrast administration. CONTRAST:  14m OMNIPAQUE IOHEXOL 300 MG/ML  SOLN COMPARISON:  05/14/2020 chest CT. FINDINGS: Cardiovascular: Normal heart size. No significant pericardial effusion/thickening. Three-vessel coronary atherosclerosis status post CABG. Right internal jugular Port-A-Cath terminates in middle third of the SVC. Atherosclerotic nonaneurysmal thoracic aorta. Normal caliber pulmonary arteries. No central pulmonary emboli. Mediastinum/Nodes: Subcentimeter hypodense posterior left thyroid nodule is stable. Not clinically significant; no follow-up imaging recommended (ref: J Am Coll Radiol. 2015 Feb;12(2): 143-50). Stable mild circumferential wall thickening in mid to lower thoracic esophagus. No axillary adenopathy. Mildly enlarged 1.6 cm right paratracheal node (series 2/image 52), decreased from 1.8 cm. Enlarged 1.5 cm subcarinal node (series 2/image 72), stable. Mildly enlarged 1.1 cm left paratracheal node (series 2/image 50), decreased from 1.3 cm. No new pathologically enlarged mediastinal nodes. Enlarged 1.6 cm right hilar node (series 2/image 65), new. No discrete left hilar adenopathy. Lungs/Pleura: No pneumothorax. Small dependent right pleural effusion, new. Small to moderate loculated left pleural effusion with mild left pleural thickening, mildly increased. Patchy ground-glass opacity, indistinct patchy vaguely nodular foci of consolidation and scattered interlobular septal thickening throughout the right lung, largely new, for example a 1.4 cm right middle lobe peripheral nodular focus of consolidation (series 3/image 110). New patchy ground-glass opacity, consolidation and interlobular septal thickening superimposed on dense sharply marginated left perihilar consolidation, with worsening volume loss in the left  hemithorax. Upper abdomen: No acute abnormality. Heterogeneously enhancing 3.9 cm anterior upper right renal mass,  increased from 3.5 cm. Musculoskeletal: No aggressive appearing focal osseous lesions. Stable scattered discontinuities in the sternotomy wires. Moderate thoracic spondylosis. IMPRESSION: 1. Largely new patchy ground-glass opacity, interlobular septal thickening and vaguely nodular regions of peripheral consolidation throughout the right greater than left lungs bilaterally. Findings are nonspecific, favoring an inflammatory etiology such as drug reaction or multilobar pneumonia. Short-term chest CT follow-up advised. 2. These new pulmonary findings are superimposed on previously visualized sharply marginated dense left perihilar consolidation favored to represent post treatment change. 3. New small dependent right pleural effusion. Small to moderate loculated left pleural effusion with mild left pleural thickening, mildly increased. 4. Mediastinal lymphadenopathy is stable to decreased. 5. New nonspecific right hilar lymphadenopathy, which could be reactive or metastatic. 6. Increased size of 3.9 cm heterogeneously enhancing anterior upper right renal mass, compatible with renal cell carcinoma. 7. Aortic Atherosclerosis (ICD10-I70.0). Electronically Signed   By: Ilona Sorrel M.D.   On: 08/18/2020 08:31    Lab Results:  CBC    Component Value Date/Time   WBC 8.6 09/07/2020 1220   RBC 4.92 09/07/2020 1220   HGB 13.7 09/07/2020 1220   HCT 41.4 09/07/2020 1220   PLT 315 09/07/2020 1220   MCV 84.1 09/07/2020 1220   MCH 27.8 09/07/2020 1220   MCHC 33.1 09/07/2020 1220   RDW 16.1 (H) 09/07/2020 1220   LYMPHSABS 0.9 09/07/2020 1220   MONOABS 0.9 09/07/2020 1220   EOSABS 0.1 09/07/2020 1220   BASOSABS 0.0 09/07/2020 1220    BMET    Component Value Date/Time   NA 135 08/20/2020 1305   K 3.9 08/20/2020 1305   CL 100 08/20/2020 1305   CO2 25 08/20/2020 1305   GLUCOSE 126 (H) 08/20/2020 1305   BUN 15 08/20/2020 1305   CREATININE 0.60 (L) 08/20/2020 1305   CALCIUM 8.8 (L) 08/20/2020 1305    GFRNONAA >60 08/20/2020 1305   GFRAA >60 08/20/2020 1305    BNP No results found for: BNP  ProBNP No results found for: PROBNP  Specialty Problems      Pulmonary Problems   Malignant neoplasm of upper lobe of left lung (HCC)   Malignant neoplasm of lung (HCC)   Shortness of breath   Pleural effusion      No Known Allergies  Immunization History  Administered Date(s) Administered   Fluad Quad(high Dose 65+) 11/26/2019    Past Medical History:  Diagnosis Date   Anxiety    Arthritis    Atrial flutter ([place])    a. diagnosed 5/19; b. s/p TEE/DCCV 05/2018 by Dr. Humphrey Rolls; c. CHADS2VASc => 4 (HTN, age x1, DM, vascular disease)   Complication of anesthesia    "wakes up too early"   Coronary artery disease    a. s/p 3-v cabg in 2004 at [place] (LIMA and radial arery used - no further details in Care Everywhere); b. LHC 8/19 underlying multi-V dz, patent LIMA-LAD, occluded VG-PDA, 90-95% stenosis of LCx s/p PCI/DES x 2   Cough    Depression    Diabetes mellitus with complication (HCC)    Dyspnea    Dysrhythmia    atrial fib.   Essential hypertension    Family history of premature CAD    a. father passed away from an MI at 53   GERD (gastroesophageal reflux disease)    Heart murmur    HLD (hyperlipidemia)    a. statin intolerant  Lung cancer ([place]) 09/2019   Chemo + Rad tx's.    Mass of left lung    MI (myocardial infarction) ([place])    2004   Obesity     Tobacco History: Social History   Tobacco Use  Smoking Status Former Smoker   Types: Cigarettes   Quit date: 2004   Years since quitting: 17.7  Smokeless Tobacco Current User  Tobacco Comment   Quit 2004   Ready to quit: Not Answered Counseling given: Not Answered Comment: Quit 2004   Continue to not smoke  Outpatient Encounter Medications as of 09/07/2020  Medication Sig   acetaminophen (TYLENOL) 500 MG tablet Take 1,000 mg by mouth every 6 (six) hours as needed for moderate pain.      benzonatate (TESSALON PERLES) 100 MG capsule Take 1 capsule (100 mg total) by mouth 3 (three) times daily as needed for cough.   clonazePAM (KLONOPIN) 0.5 MG tablet Take 1 tablet (0.5 mg total) by mouth 2 (two) times daily as needed for anxiety.   empagliflozin (JARDIANCE) 25 MG TABS tablet Take 25 mg by mouth daily.   ezetimibe (ZETIA) 10 MG tablet Take 1 tablet (10 mg total) by mouth daily.   furosemide (LASIX) 20 MG tablet Take 1 tablet (20 mg total) by mouth daily. For one week   gabapentin (NEURONTIN) 300 MG capsule Take 1 capsule (300 mg total) by mouth 3 (three) times daily.   glipiZIDE (GLUCOTROL) 10 MG tablet Take 10 mg by mouth daily before breakfast.    Glycopyrrolate-Formoterol (BEVESPI AEROSPHERE) 9-4.8 MCG/ACT AERO Inhale 2 puffs into the lungs 2 (two) times daily.   HYDROcodone-homatropine (HYCODAN) 5-1.5 MG/5ML syrup Take 5 mLs by mouth every 6 (six) hours as needed for cough.   losartan (COZAAR) 50 MG tablet Take 50 mg by mouth daily.    methimazole (TAPAZOLE) 10 MG tablet Take 10 mg by mouth in the morning and at bedtime.   metoprolol tartrate (LOPRESSOR) 25 MG tablet Take 25 mg by mouth at bedtime.   morphine (ROXANOL) 20 MG/ML concentrated solution Take 1 mL (20 mg total) by mouth every 4 (four) hours as needed for severe pain.   nitroGLYCERIN (NITROSTAT) 0.4 MG SL tablet Place 1 tablet (0.4 mg total) under the tongue every 5 (five) minutes as needed for chest pain. Maximum of 3 doses.   Omega-3 Fatty Acids (FISH OIL) 1200 MG CAPS Take 2,400 mg by mouth at bedtime.    pantoprazole (PROTONIX) 40 MG tablet Take 1 tablet (40 mg total) by mouth daily.   prochlorperazine (COMPAZINE) 10 MG tablet Take 1 tablet (10 mg total) by mouth every 6 (six) hours as needed for nausea or vomiting.   rivaroxaban (XARELTO) 20 MG TABS tablet Take 1 tablet (20 mg total) by mouth daily with supper.   sertraline (ZOLOFT) 50 MG tablet Take 1 tablet (50 mg total) by mouth daily.    ipratropium-albuterol (DUONEB) 0.5-2.5 (3) MG/3ML SOLN Take 3 mLs by nebulization every 6 (six) hours as needed.   [DISCONTINUED] predniSONE (STERAPRED UNI-PAK 21 TAB) 10 MG (21) TBPK tablet Use as directed   Facility-Administered Encounter Medications as of 09/07/2020  Medication   sodium chloride flush (NS) 0.9 % injection 10 mL     Review of Systems  Review of Systems  Constitutional: Positive for fatigue. Negative for activity change, chills, fever and unexpected weight change.  HENT: Negative for congestion, postnasal drip, rhinorrhea, sinus pressure, sinus pain and sore throat.   Eyes: Negative.   Respiratory: Positive  for cough, shortness of breath and wheezing.        Increased orthopnea  Cardiovascular: Negative for chest pain and palpitations.  Gastrointestinal: Negative for constipation, diarrhea, nausea and vomiting.  Endocrine: Negative.   Genitourinary: Negative.   Musculoskeletal: Negative.   Skin: Negative.   Neurological: Negative for dizziness and headaches.  Psychiatric/Behavioral: Negative.  Negative for dysphoric mood. The patient is not nervous/anxious.   All other systems reviewed and are negative.    Physical Exam  BP 124/80 (BP Location: Left Arm, Patient Position: Sitting, Cuff Size: Large)    Pulse 100    Temp (!) 97.3 F (36.3 C) (Temporal)    Ht _0  (1.803 m)    Wt 234 lb 3.2 oz (106.2 kg)    SpO2 96%    BMI 32.66 kg/m   Wt Readings from Last 5 Encounters:  09/07/20 234 lb 3.2 oz (106.2 kg)  08/20/20 236 lb 14.4 oz (107.5 kg)  07/29/20 236 lb 3.2 oz (107.1 kg)  07/01/20 239 lb (108.4 kg)  06/22/20 237 lb 8 oz (107.7 kg)    BMI Readings from Last 5 Encounters:  09/07/20 32.66 kg/m  08/20/20 33.04 kg/m  07/29/20 32.94 kg/m  07/01/20 33.33 kg/m  06/22/20 33.12 kg/m     Physical Exam Vitals and nursing note reviewed.  Constitutional:      General: He is not in acute distress.    Appearance: Normal appearance. He is obese. He is  ill-appearing.     Comments: Chronically ill elderly male  HENT:     Head: Normocephalic and atraumatic.     Right Ear: Hearing and external ear normal.     Left Ear: Hearing and external ear normal.     Nose: No mucosal edema.     Right Turbinates: Not enlarged.     Left Turbinates: Not enlarged.  Eyes:     Pupils: Pupils are equal, round, and reactive to light.  Cardiovascular:     Rate and Rhythm: Normal rate and regular rhythm.     Pulses: Normal pulses.     Heart sounds: Normal heart sounds. No murmur heard.   Pulmonary:     Effort: Pulmonary effort is normal.     Breath sounds: Examination of the right-lower field reveals decreased breath sounds. Examination of the left-lower field reveals decreased breath sounds. Decreased breath sounds and wheezing (Occasional faint expiratory) present. No rales.     Comments: Scattered squeaks Musculoskeletal:     Cervical back: Normal range of motion.     Right lower leg: No edema.     Left lower leg: No edema.  Lymphadenopathy:     Cervical: No cervical adenopathy.  Skin:    General: Skin is warm and dry.     Capillary Refill: Capillary refill takes less than 2 seconds.     Findings: No erythema or rash.  Neurological:     General: No focal deficit present.     Mental Status: He is alert and oriented to person, place, and time.     Motor: No weakness.     Coordination: Coordination normal.     Gait: Gait is intact. Gait normal.  Psychiatric:        Mood and Affect: Mood normal.        Behavior: Behavior normal. Behavior is cooperative.        Thought Content: Thought content normal.        Judgment: Judgment normal.       Assessment &  Plan:   Pleural effusion Most recent CT shows a right pleural effusion as well as a potentially left loculated pleural effusion Status post thoracentesis in July/2021 which provide symptomatic relief Diminished breath sounds in the bases Patient reporting worsened orthopnea Patient with  stage III left lung cancer  Plan: Chest x-ray today Lab work today We will need to route chart to cardiology to see if patient may be able to proceed forward with a thoracentesis and receive further instructions of patient's Xarelto We will route note to oncology as patient may need therapeutic thoracentesis and may need to be considered a candidate for a Pleurx drain in the future  Malignant neoplasm of upper lobe of left lung ([place]) Plan: Continue follow-up with oncology Chest x-ray today Lab work today   Shortness of breath Suspect patient shortness of breath is likely multifactorial given his stage III squamous cell carcinoma, suspected COPD, known pleural effusions.  Plan: Lab work today Chest x-ray today May need to consider therapeutic thoracentesis based off of chest x-ray Patient also may benefit from maintenance diuretic, will check lab work and kidney functioning today and can discuss with cardiology  COPD suggested by initial evaluation ([place]) Suspected COPD Former smoker Continued dyspnea Struggled with mobilizing DPI Did not feel much clinical relief with Bevespi  Plan: We will order nebulizer today We will order DuoNeb scheduled nebulized medication We will order pulmonary function testing Chest x-ray today    Return in about 4 weeks (around 10/05/2020), or if symptoms worsen or fail to improve, for [facility] - [name].   Lauraine Rinne, NP 09/07/2020   This appointment required 45 minutes of patient care (this includes precharting, chart review, review of results, face-to-face care, etc.).

## 2020-09-10 ENCOUNTER — Ambulatory Visit: Payer: Medicare Other

## 2020-09-10 ENCOUNTER — Other Ambulatory Visit: Payer: Self-pay

## 2020-09-10 DIAGNOSIS — J90 Pleural effusion, not elsewhere classified: Secondary | ICD-10-CM

## 2020-09-10 NOTE — Progress Notes (Signed)
Noted, thank you for your input and follow up  Derrick Gallegos

## 2020-09-10 NOTE — Progress Notes (Signed)
Agree with the details as noted below.  He will likely need rebronchoscopy suspect ongoing progression of malignancy.  Will reassess on follow-up.  Renold Don, MD [facility]

## 2020-09-14 ENCOUNTER — Other Ambulatory Visit: Payer: Self-pay | Admitting: *Deleted

## 2020-09-14 ENCOUNTER — Ambulatory Visit: Payer: Medicare Other | Admitting: Urology

## 2020-09-14 ENCOUNTER — Inpatient Hospital Stay: Payer: Medicare Other | Attending: Hospice and Palliative Medicine | Admitting: Hospice and Palliative Medicine

## 2020-09-14 DIAGNOSIS — F329 Major depressive disorder, single episode, unspecified: Secondary | ICD-10-CM | POA: Diagnosis not present

## 2020-09-14 DIAGNOSIS — F419 Anxiety disorder, unspecified: Secondary | ICD-10-CM | POA: Diagnosis not present

## 2020-09-14 DIAGNOSIS — Z515 Encounter for palliative care: Secondary | ICD-10-CM

## 2020-09-14 DIAGNOSIS — C3412 Malignant neoplasm of upper lobe, left bronchus or lung: Secondary | ICD-10-CM | POA: Diagnosis not present

## 2020-09-14 DIAGNOSIS — I2510 Atherosclerotic heart disease of native coronary artery without angina pectoris: Secondary | ICD-10-CM | POA: Diagnosis not present

## 2020-09-14 DIAGNOSIS — F32A Depression, unspecified: Secondary | ICD-10-CM

## 2020-09-14 MED ORDER — LORAZEPAM 1 MG PO TABS: 1.0000 mg | ORAL_TABLET | Freq: Three times a day (TID) | ORAL | 0 refills | Status: DC | PRN

## 2020-09-14 MED ORDER — HYDROCODONE-HOMATROPINE 5-1.5 MG/5ML PO SYRP
5.0000 mL | ORAL_SOLUTION | Freq: Four times a day (QID) | ORAL | 0 refills | Status: DC | PRN
Start: 2020-09-14 — End: 2020-09-28

## 2020-09-14 NOTE — Progress Notes (Signed)
Virtual Visit via Video Note  I connected with Derrick Gallegos on 09/14/20 at 10:30 AM EDT by a video enabled telemedicine application and verified that I am speaking with the correct person using two identifiers.   I discussed the limitations of evaluation and management by telemedicine and the availability of in person appointments. The patient expressed understanding and agreed to proceed.  History of Present Illness: Derrick Gallegos is a 69 y.o. male with multiple medical problems including CAD status post CABG, DM2, hyperthyroidism, and stage III lung cancer status post chemo/radiation and immunotherapy.  Patient has history of a right renal mass under surveillance.  He has had significant symptoms of anxiety and fatigue resulting in discontinuation of maintenance nivolumab.  CT of the chest on 08/17/2020 revealed patchy groundglass opacity concerning for inflammatory process versus multilobar pneumonia, bilateral pleural effusions, and new right hilar lymphadenopathy could be reactive or metastatic.  Patient was referred to palliative care to help address goals and manage ongoing symptoms.  Observations/Objective: Had a MyChart visit with patient and daughter.  Patient saw Wyn Quaker with pulmonary on 9/14.  Chest x-ray revealed loculated pleural effusion.  It appears patient was referred to CVTS with Dr. Genevive Bi.  Patient says that he feels overall miserable.  He continues to endorse chronic shortness of breath, which is worse with exertion.  He says the only time when he felt relief was after his thoracentesis.  Unfortunately, most recent chest x-ray shows loculation.  Again, patient is pending appointment with Dr. Genevive Bi.   Patient says that he started sleeping more after the last dose increase of Zoloft to 50mg .  He reduced the dose back to 25 mg daily.  However, he still says that he sleeps a lot.  He has little energy.  He cannot tell any improvement with rotation to clonazepam from lorazepam for  management of anxiety.  He says that he discontinued the clonazepam and has resumed the lorazepam at 1 mg 3 times daily.  He requests a refill of the lorazepam.  Patient says that he has also had nausea with poor oral intake.  He has been taking Compazine as needed.  He is also taking morphine elixir as needed for pain/dyspnea.  Discussed the role of sedating medications as potentially a causative factor for his drowsiness.  Patient had questions from [name] office.  I reached out to [name] and requested someone call patient/family.  Patient is in agreement to referral for psychiatry eval for management of intractable depression/anxiety.  Assessment and Plan: Stage III lung cancer -  on active surveillance.  Followed by Dr. Janese Banks.  Discussed case with her and next oncology follow-up visit is scheduled in November.  Last chest CT was 08/17/2020 and favored inflammatory etiology.  Symptoms do not seem to be secondary to cancer.  Loculated pleural effusion with chronic dyspnea -pending evaluation by CVTS  Anxiety/depression -continue Zoloft 25 mg daily.  Refill lorazepam 1 mg 3 times daily as needed #90.  PDMP reviewed.  Referral to psychiatry  Case and plan discussed with Drs. Janese Banks and Patsey Berthold  Follow Up Instructions: RTC 2 months or sooner if needed   I discussed the assessment and treatment plan with the patient. The patient was provided an opportunity to ask questions and all were answered. The patient agreed with the plan and demonstrated an understanding of the instructions.   The patient was advised to call back or seek an in-person evaluation if the symptoms worsen or if the condition fails  to improve as anticipated.  I provided 30 minutes of non-face-to-face time during this encounter.   Irean Hong, NP

## 2020-09-14 NOTE — Progress Notes (Signed)
Called pt and and got voicemail and left message that he was scheduled for video visit with palliative care today. If he can call back and we can go over chart info

## 2020-09-15 ENCOUNTER — Encounter: Payer: Self-pay | Admitting: Family

## 2020-09-16 ENCOUNTER — Encounter: Payer: Self-pay | Admitting: Oncology

## 2020-09-16 ENCOUNTER — Other Ambulatory Visit: Payer: Self-pay | Admitting: Hospice and Palliative Medicine

## 2020-09-16 ENCOUNTER — Telehealth: Payer: Self-pay | Admitting: *Deleted

## 2020-09-16 MED ORDER — MORPHINE SULFATE ER 30 MG PO TBCR: 30.0000 mg | EXTENDED_RELEASE_TABLET | Freq: Two times a day (BID) | ORAL | 0 refills | Status: DC

## 2020-09-16 NOTE — Telephone Encounter (Signed)
I spoke with patient/wife regarding pain. Discussed with Dr. Janese Banks. Rx sent for MS Contin

## 2020-09-16 NOTE — Progress Notes (Addendum)
I received a call from patient and wife.  Patient reports that he is having generalized head to toe pain, which is not fully controlled with morphine elixir.  He says that he is currently taking morphine elixir 20 mg (1 mL) every 4 hours around-the-clock.  He asked if it be okay to increase the dose.  Instead, I suggested that we could start a long-acting opioid such as MS Contin to hopefully reduce need for frequent short acting morphine.  Both patient and wife are in agreement that plan.  Also discussed with Dr. Janese Banks.  Plan: -Start MS Contin 30 mg every 12 hours, #60 tablets -Continue morphine elixir 20 mg every 4 hours as needed for breakthrough pain -Continue prophylactic bowel regimen -Naloxone kit

## 2020-09-16 NOTE — Telephone Encounter (Signed)
Call from wife reporting that patient has been having increasing pain worse in the past 4 days and he is requesting an increase in his morphine. He would like a return call from Sharion Dove, NP to discuss.[phone]

## 2020-09-17 MED ORDER — NALOXONE HCL 4 MG/0.1ML NA LIQD: NASAL | 0 refills | Status: AC

## 2020-09-17 NOTE — Addendum Note (Signed)
Addended by: Altha Harm R on: 09/17/2020 09:09 AM   Modules accepted: Orders

## 2020-09-19 ENCOUNTER — Encounter: Payer: Self-pay | Admitting: Oncology

## 2020-09-20 ENCOUNTER — Other Ambulatory Visit: Payer: Self-pay | Admitting: *Deleted

## 2020-09-20 MED ORDER — PROCHLORPERAZINE MALEATE 10 MG PO TABS: 10.0000 mg | ORAL_TABLET | Freq: Four times a day (QID) | ORAL | 0 refills | Status: AC | PRN

## 2020-09-20 MED ORDER — PROCHLORPERAZINE MALEATE 10 MG PO TABS: 10.0000 mg | ORAL_TABLET | Freq: Four times a day (QID) | ORAL | 0 refills | Status: DC | PRN

## 2020-09-23 ENCOUNTER — Other Ambulatory Visit: Payer: Self-pay | Admitting: *Deleted

## 2020-09-23 ENCOUNTER — Telehealth: Payer: Self-pay

## 2020-09-23 MED ORDER — SERTRALINE HCL 50 MG PO TABS: 50.0000 mg | ORAL_TABLET | Freq: Every day | ORAL | 1 refills | Status: AC

## 2020-09-23 NOTE — Telephone Encounter (Signed)
Spoke with judy from [place] office regarding is referreal to there clinic. Bethena Roys stated she could not get in touch with Mr.Wilson but she would try again.

## 2020-09-24 ENCOUNTER — Telehealth: Payer: Self-pay | Admitting: Pulmonary Disease

## 2020-09-24 NOTE — Telephone Encounter (Signed)
Spoke to [place] with [facility], who is questioning if patient had PFT.  Claiborne Billings is aware that PFT was ordered on 09/07/2020, however it has not been scheduled as of yet. Nothing further needed.

## 2020-09-27 ENCOUNTER — Emergency Department: Payer: Medicare Other

## 2020-09-27 ENCOUNTER — Other Ambulatory Visit: Payer: Self-pay

## 2020-09-27 ENCOUNTER — Encounter: Payer: Self-pay | Admitting: Oncology

## 2020-09-27 ENCOUNTER — Inpatient Hospital Stay
Admission: EM | Admit: 2020-09-27 | Discharge: 2020-09-27 | DRG: 187 | Discharge status: Against Medical Advice | Payer: Medicare Other | Attending: Internal Medicine | Admitting: Internal Medicine

## 2020-09-27 ENCOUNTER — Telehealth: Payer: Self-pay | Admitting: *Deleted

## 2020-09-27 ENCOUNTER — Encounter: Payer: Self-pay | Admitting: Cardiothoracic Surgery

## 2020-09-27 ENCOUNTER — Ambulatory Visit (INDEPENDENT_AMBULATORY_CARE_PROVIDER_SITE_OTHER): Payer: Medicare Other | Admitting: Cardiothoracic Surgery

## 2020-09-27 VITALS — BP 113/78 | HR 137 | Temp 97.9°F | Resp 16 | Ht 71.0 in | Wt 231.0 lb

## 2020-09-27 DIAGNOSIS — I4891 Unspecified atrial fibrillation: Secondary | ICD-10-CM | POA: Diagnosis present

## 2020-09-27 DIAGNOSIS — Z7901 Long term (current) use of anticoagulants: Secondary | ICD-10-CM | POA: Diagnosis not present

## 2020-09-27 DIAGNOSIS — J90 Pleural effusion, not elsewhere classified: Principal | ICD-10-CM | POA: Diagnosis present

## 2020-09-27 DIAGNOSIS — I252 Old myocardial infarction: Secondary | ICD-10-CM | POA: Diagnosis not present

## 2020-09-27 DIAGNOSIS — I2510 Atherosclerotic heart disease of native coronary artery without angina pectoris: Secondary | ICD-10-CM | POA: Diagnosis present

## 2020-09-27 DIAGNOSIS — I4892 Unspecified atrial flutter: Secondary | ICD-10-CM | POA: Diagnosis present

## 2020-09-27 DIAGNOSIS — R0902 Hypoxemia: Secondary | ICD-10-CM | POA: Diagnosis present

## 2020-09-27 DIAGNOSIS — Z8249 Family history of ischemic heart disease and other diseases of the circulatory system: Secondary | ICD-10-CM | POA: Diagnosis not present

## 2020-09-27 DIAGNOSIS — I10 Essential (primary) hypertension: Secondary | ICD-10-CM | POA: Diagnosis present

## 2020-09-27 DIAGNOSIS — Z87891 Personal history of nicotine dependence: Secondary | ICD-10-CM

## 2020-09-27 DIAGNOSIS — Z79891 Long term (current) use of opiate analgesic: Secondary | ICD-10-CM | POA: Diagnosis not present

## 2020-09-27 DIAGNOSIS — K219 Gastro-esophageal reflux disease without esophagitis: Secondary | ICD-10-CM | POA: Diagnosis present

## 2020-09-27 DIAGNOSIS — R000 Tachycardia, unspecified: Secondary | ICD-10-CM | POA: Diagnosis present

## 2020-09-27 DIAGNOSIS — R0789 Other chest pain: Secondary | ICD-10-CM

## 2020-09-27 DIAGNOSIS — C3492 Malignant neoplasm of unspecified part of left bronchus or lung: Secondary | ICD-10-CM | POA: Diagnosis present

## 2020-09-27 DIAGNOSIS — E119 Type 2 diabetes mellitus without complications: Secondary | ICD-10-CM | POA: Diagnosis present

## 2020-09-27 DIAGNOSIS — R0602 Shortness of breath: Secondary | ICD-10-CM

## 2020-09-27 DIAGNOSIS — I4510 Unspecified right bundle-branch block: Secondary | ICD-10-CM | POA: Diagnosis present

## 2020-09-27 DIAGNOSIS — Z923 Personal history of irradiation: Secondary | ICD-10-CM

## 2020-09-27 DIAGNOSIS — I471 Supraventricular tachycardia: Secondary | ICD-10-CM

## 2020-09-27 DIAGNOSIS — F32A Depression, unspecified: Secondary | ICD-10-CM | POA: Diagnosis present

## 2020-09-27 DIAGNOSIS — Z955 Presence of coronary angioplasty implant and graft: Secondary | ICD-10-CM

## 2020-09-27 DIAGNOSIS — Z20822 Contact with and (suspected) exposure to covid-19: Secondary | ICD-10-CM | POA: Diagnosis present

## 2020-09-27 DIAGNOSIS — E785 Hyperlipidemia, unspecified: Secondary | ICD-10-CM | POA: Diagnosis present

## 2020-09-27 DIAGNOSIS — Z7984 Long term (current) use of oral hypoglycemic drugs: Secondary | ICD-10-CM

## 2020-09-27 DIAGNOSIS — F419 Anxiety disorder, unspecified: Secondary | ICD-10-CM | POA: Diagnosis present

## 2020-09-27 DIAGNOSIS — Z9221 Personal history of antineoplastic chemotherapy: Secondary | ICD-10-CM

## 2020-09-27 DIAGNOSIS — J9600 Acute respiratory failure, unspecified whether with hypoxia or hypercapnia: Secondary | ICD-10-CM | POA: Diagnosis present

## 2020-09-27 DIAGNOSIS — R079 Chest pain, unspecified: Secondary | ICD-10-CM | POA: Diagnosis present

## 2020-09-27 LAB — COMPREHENSIVE METABOLIC PANEL
ALT: 11 U/L (ref 0–44)
AST: 13 U/L — ABNORMAL LOW (ref 15–41)
Albumin: 3.6 g/dL (ref 3.5–5.0)
Alkaline Phosphatase: 77 U/L (ref 38–126)
Anion gap: 10 (ref 5–15)
BUN: 11 mg/dL (ref 8–23)
CO2: 23 mmol/L (ref 22–32)
Calcium: 9.8 mg/dL (ref 8.9–10.3)
Chloride: 100 mmol/L (ref 98–111)
Creatinine, Ser: 0.72 mg/dL (ref 0.61–1.24)
GFR calc Af Amer: >60 mL/min (ref >60)
GFR calc non Af Amer: >60 mL/min (ref >60)
Glucose, Bld: 208 mg/dL — ABNORMAL HIGH (ref 70–99)
Potassium: 3.6 mmol/L (ref 3.5–5.1)
Sodium: 133 mmol/L — ABNORMAL LOW (ref 135–145)
Total Bilirubin: 0.6 mg/dL (ref 0.3–1.2)
Total Protein: 7.1 g/dL (ref 6.5–8.1)

## 2020-09-27 LAB — CBC WITH DIFFERENTIAL/PLATELET
Abs Immature Granulocytes: 0.05 10*3/uL (ref 0.00–0.07)
Basophils Absolute: 0 10*3/uL (ref 0.0–0.1)
Basophils Relative: 1 %
Eosinophils Absolute: 0.1 10*3/uL (ref 0.0–0.5)
Eosinophils Relative: 1 %
HCT: 37.9 % — ABNORMAL LOW (ref 39.0–52.0)
Hemoglobin: 12.3 g/dL — ABNORMAL LOW (ref 13.0–17.0)
Immature Granulocytes: 1 %
Lymphocytes Relative: 10 %
Lymphs Abs: 0.6 10*3/uL — ABNORMAL LOW (ref 0.7–4.0)
MCH: 27.3 pg (ref 26.0–34.0)
MCHC: 32.5 g/dL (ref 30.0–36.0)
MCV: 84 fL (ref 80.0–100.0)
Monocytes Absolute: 0.8 10*3/uL (ref 0.1–1.0)
Monocytes Relative: 12 %
Neutro Abs: 4.9 10*3/uL (ref 1.7–7.7)
Neutrophils Relative %: 75 %
Platelets: 368 10*3/uL (ref 150–400)
RBC: 4.51 MIL/uL (ref 4.22–5.81)
RDW: 16.3 % — ABNORMAL HIGH (ref 11.5–15.5)
WBC: 6.4 10*3/uL (ref 4.0–10.5)
nRBC: 0 % (ref 0.0–0.2)

## 2020-09-27 LAB — MAGNESIUM: Magnesium: 2 mg/dL (ref 1.7–2.4)

## 2020-09-27 LAB — RESPIRATORY PANEL BY RT PCR (FLU A&B, COVID)
Influenza A by PCR: NEGATIVE
Influenza B by PCR: NEGATIVE
SARS Coronavirus 2 by RT PCR: NEGATIVE

## 2020-09-27 LAB — LACTIC ACID, PLASMA: Lactic Acid, Venous: 1.7 mmol/L (ref 0.5–1.9)

## 2020-09-27 LAB — TROPONIN I (HIGH SENSITIVITY): Troponin I (High Sensitivity): 10 ng/L (ref <18)

## 2020-09-27 MED ORDER — FENTANYL CITRATE (PF) 100 MCG/2ML IJ SOLN
50.0000 ug | Freq: Once | INTRAMUSCULAR | Status: AC
  Administered 2020-09-27: 50 ug via INTRAVENOUS
  Filled 2020-09-27: qty 2

## 2020-09-27 MED ORDER — LIDOCAINE-EPINEPHRINE 2 %-1:100000 IJ SOLN
20.0000 mL | Freq: Once | INTRAMUSCULAR | Status: AC
  Administered 2020-09-27: 20 mL
  Filled 2020-09-27: qty 1

## 2020-09-27 NOTE — ED Notes (Signed)
Pt stated that he wanted to leave and not be admitted. Dr. Tamala Julian in to see pt and had a long discussion, but pt requested to leave.

## 2020-09-27 NOTE — Telephone Encounter (Signed)
Daughter Derrick Gallegos called reporting that patient just discharged form hospital and that she said someone wanted to discuss hospice at home care Please return her call [phone]

## 2020-09-27 NOTE — Telephone Encounter (Signed)
Dr. Janese Banks has spoke to daughter and waiting to hear back from ER attending about what happened in ER and the result of talking about hospice care. Dr. Janese Banks will call daughter back when she has all the info about pt

## 2020-09-27 NOTE — Patient Instructions (Addendum)
Our surgery scheduler will call to schedule your surgery within 24-48 hours. Please have the [facility] surgery sheet available when speaking with her.   Please read the information packet on Pleurx Catheter and Plueral Efffusion.   Patient transported to Emergency room for care.

## 2020-09-27 NOTE — ED Triage Notes (Signed)
[facility]. Pt presents with generalized CP across the pt's entire chest. Pt was at the surgery center to get fluid off of his lung. Pt has an extensive cardiac hx and hx of lung cancer. Pt also c/o SOB. Pt on 4L chronically. EMS gave pt 324 of ASA. On arrival, pt HR in the 130s. Pt A&Ox4

## 2020-09-27 NOTE — Progress Notes (Signed)
Patient ID: Derrick Gallegos, male   DOB: Jun 04, 1951, 69 y.o.   MRN: [id number]  Chief Complaint  Patient presents with  . New Patient (Initial Visit)    recurrent pleural effusion    Referred By Dr. Ilda Basset Reason for Referral recurrent left pleural effusion  HPI Location, Quality, Duration, Severity, Timing, Context, Modifying Factors, Associated Signs and Symptoms.  Derrick Gallegos is a 69 y.o. male.  This patient is accompanied today in the office by his daughter.  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  He received radiation therapy and chemotherapy and has been treated with immunotherapy which she did not tolerate very well.  Over the last several months he has been complaining of increasing shortness of breath.  He states that he is unable to walk more than a few feet before he gets short of breath and also experiences a cough.  He is unable to lie on his back because he gets so short of breath and coughs.  He did undergo a thoracentesis several weeks ago which considerably improved his symptoms.  He had a cytology examination of the pleural fluid and this was negative for malignancy.  He comes in today to discuss further evaluations.  His most recent CT scan was reviewed by me and discussed with the patient and his daughter.  Also during my examination he was noted to be in A. fib with a rapid ventricular response.  His heart rate was about 130s.  EMS was called and a portable EKG showed what appears to be a supraventricular tachycardia arrhythmia with a right bundle branch block.  He does have a history of coronary disease and has recently undergone some stent placement as recently as a year or 2 ago.  He is currently on Xarelto for that.   Past Medical History:  Diagnosis Date  . Anxiety   . Arthritis   . Atrial flutter ([place])    a. diagnosed 5/19; b. s/p TEE/DCCV 05/2018 by Dr. Humphrey Rolls; c. CHADS2VASc =>  4 (HTN, age x1, DM, vascular disease)  . Complication of anesthesia    "wakes up too early"  . Coronary artery disease    a. s/p 3-v cabg in 2004 at [facility] (LIMA and radial arery used - no further details in Care Everywhere); b. LHC 8/19 underlying multi-V dz, patent LIMA-LAD, occluded VG-PDA, 90-95% stenosis of LCx s/p PCI/DES x 2  . Cough   . Depression   . Diabetes mellitus with complication ([place])   . Dyspnea   . Dysrhythmia    atrial fib.  . Essential hypertension   . Family history of premature CAD    a. father passed away from an MI at 19  . GERD (gastroesophageal reflux disease)   . Heart murmur   . HLD (hyperlipidemia)    a. statin intolerant   . Lung cancer ([place]) 09/2019   Chemo + Rad tx's.   . Mass of left lung   . MI (myocardial infarction) ([place])    2004  . Obesity     Past Surgical History:  Procedure Laterality Date  . CARDIAC CATHETERIZATION  2004  . CARDIAC CATHETERIZATION  2019   x2 stents [facility]  . CARDIAC SURGERY    . CARDIOVERSION N/A 06/20/2018   Procedure: CARDIOVERSION;  Surgeon: Dionisio David, MD;  Location: [facility] ORS;  Service: Cardiovascular;  Laterality: N/A;  . CORONARY ARTERY BYPASS GRAFT    .  CORONARY STENT INTERVENTION N/A 08/08/2018   Procedure: CORONARY STENT INTERVENTION;  Surgeon: Isaias Cowman, MD;  Location: [place] [facility];  Service: Cardiovascular;  Laterality: N/A;  . LEFT HEART CATH AND CORONARY ANGIOGRAPHY Left 08/08/2018   Procedure: LEFT HEART CATH AND CORONARY ANGIOGRAPHY;  Surgeon: Dionisio David, MD;  Location: [place] [facility];  Service: Cardiovascular;  Laterality: Left;  . PORTA CATH INSERTION N/A 11/10/2019   Procedure: PORTA CATH INSERTION;  Surgeon: Algernon Huxley, MD;  Location: [place] [facility];  Service: Cardiovascular;  Laterality: N/A;  . TEE WITHOUT CARDIOVERSION N/A 06/20/2018   Procedure: TRANSESOPHAGEAL ECHOCARDIOGRAM (TEE);  Surgeon: Dionisio David, MD;  Location: [facility] ORS;  Service: Cardiovascular;   Laterality: N/A;  . VIDEO BRONCHOSCOPY N/A 10/29/2019   Procedure: VIDEO BRONCHOSCOPY WITH ENDOBRONCHICAL ULTRASOUND;  Surgeon: Tyler Pita, MD;  Location: [facility] ORS;  Service: Cardiopulmonary;  Laterality: N/A;    Family History  Problem Relation Age of Onset  . CAD Mother   . CAD Father        a. MI at 57-->death  . CAD Brother     Social History Social History   Tobacco Use  . Smoking status: Former Smoker    Packs/day: 3.00    Types: Cigarettes    Quit date: 2004    Years since quitting: 17.7  . Smokeless tobacco: Current User  . Tobacco comment: Quit 2004  Vaping Use  . Vaping Use: Never used  Substance Use Topics  . Alcohol use: No  . Drug use: No    No Known Allergies  Current Outpatient Medications  Medication Sig Dispense Refill  . acetaminophen (TYLENOL) 500 MG tablet Take 1,000 mg by mouth every 6 (six) hours as needed for moderate pain.     . benzonatate (TESSALON PERLES) 100 MG capsule Take 1 capsule (100 mg total) by mouth 3 (three) times daily as needed for cough. 20 capsule 0  . empagliflozin (JARDIANCE) 25 MG TABS tablet Take 25 mg by mouth daily.    Marland Kitchen ezetimibe (ZETIA) 10 MG tablet Take 1 tablet (10 mg total) by mouth daily. 90 tablet 0  . furosemide (LASIX) 20 MG tablet Take 1 tablet (20 mg total) by mouth daily. For one week 90 tablet 3  . gabapentin (NEURONTIN) 300 MG capsule Take 1 capsule (300 mg total) by mouth 3 (three) times daily. 90 capsule 3  . glipiZIDE (GLUCOTROL) 10 MG tablet Take 10 mg by mouth daily before breakfast.     . Glycopyrrolate-Formoterol (BEVESPI AEROSPHERE) 9-4.8 MCG/ACT AERO Inhale 2 puffs into the lungs 2 (two) times daily. 10.7 g 11  . HYDROcodone-homatropine (HYCODAN) 5-1.5 MG/5ML syrup Take 5 mLs by mouth every 6 (six) hours as needed for cough. 120 mL 0  . ipratropium-albuterol (DUONEB) 0.5-2.5 (3) MG/3ML SOLN Take 3 mLs by nebulization every 6 (six) hours as needed. 360 mL 3  . LORazepam (ATIVAN) 1 MG tablet Take 1  tablet (1 mg total) by mouth every 8 (eight) hours as needed for anxiety. 90 tablet 0  . losartan (COZAAR) 50 MG tablet Take 50 mg by mouth daily.     . methimazole (TAPAZOLE) 10 MG tablet Take 10 mg by mouth in the morning and at bedtime.    . metoprolol tartrate (LOPRESSOR) 25 MG tablet Take 25 mg by mouth at bedtime.    Marland Kitchen morphine (MS CONTIN) 30 MG 12 hr tablet Take 1 tablet (30 mg total) by mouth every 12 (twelve) hours. 60 tablet  0  . morphine (ROXANOL) 20 MG/ML concentrated solution Take 1 mL (20 mg total) by mouth every 4 (four) hours as needed for severe pain. 60 mL 0  . naloxone (NARCAN) nasal spray 4 mg/0.1 mL 1 spray into nostril upon signs of opioid overdose. Call 911. May repeat once if no response within 2-3 minutes. 1 each 0  . nitroGLYCERIN (NITROSTAT) 0.4 MG SL tablet Place 1 tablet (0.4 mg total) under the tongue every 5 (five) minutes as needed for chest pain. Maximum of 3 doses. 25 tablet 3  . Omega-3 Fatty Acids (FISH OIL) 1200 MG CAPS Take 2,400 mg by mouth at bedtime.     . pantoprazole (PROTONIX) 40 MG tablet Take 1 tablet (40 mg total) by mouth daily. 30 tablet 3  . prochlorperazine (COMPAZINE) 10 MG tablet Take 1 tablet (10 mg total) by mouth every 6 (six) hours as needed for nausea or vomiting. 30 tablet 0  . rivaroxaban (XARELTO) 20 MG TABS tablet Take 1 tablet (20 mg total) by mouth daily with supper. 90 tablet 2  . sertraline (ZOLOFT) 50 MG tablet Take 1 tablet (50 mg total) by mouth daily. 90 tablet 1   No current facility-administered medications for this visit.   Facility-Administered Medications Ordered in Other Visits  Medication Dose Route Frequency Provider Last Rate Last Admin  . sodium chloride flush (NS) 0.9 % injection 10 mL  10 mL Intravenous PRN Sindy Guadeloupe, MD   10 mL at 06/22/20 [id number]      Review of Systems A complete review of systems was asked and was negative except for the following positive findings malaise, orthopnea, nausea, cough,  shortness of breath, wheezing.  Blood pressure 113/78, pulse (!) 137, temperature 97.9 F (36.6 C), temperature source Oral, resp. rate 16, height 5\' 11"  (1.803 m), weight 231 lb (104.8 kg), SpO2 94 %.  Physical Exam CONSTITUTIONAL:  Pleasant, well-developed, well-nourished, and in no acute distress. EYES: Pupils equal and reactive to light, Sclera non-icteric EARS, NOSE, MOUTH AND THROAT:  The oropharynx was clear.  Dentition is good repair.  Oral mucosa pink and moist. LYMPH NODES:  Lymph nodes in the neck and axillae were normal RESPIRATORY:  Lungs were clear on the right but diminished on the left..  Normal respiratory effort without pathologic use of accessory muscles of respiration CARDIOVASCULAR: Heart was regular without murmurs though he was tachycardic.Marland Kitchen  There were no carotid bruits. GI: The abdomen was soft, nontender, and nondistended. There were no palpable masses. There was no hepatosplenomegaly. There were normal bowel sounds in all quadrants. GU:  Rectal deferred.   MUSCULOSKELETAL:  Normal muscle strength and tone.  No clubbing or cyanosis.   SKIN:  There were no pathologic skin lesions.  There were no nodules on palpation. NEUROLOGIC:  Sensation is normal.  Cranial nerves are grossly intact. PSYCH:  Oriented to person, place and time.  Mood and affect are normal.  Data Reviewed CT scans  I have personally reviewed the patient's imaging, laboratory findings and medical records.    Assessment    Recurrent left pleural effusion    Plan    I had a long discussion with the patient and his family regarding the indications and risks of further evaluation of his pleural effusion.  We discussed the role of thoracoscopy possible thoracotomy for pleural biopsy talc pleurodesis and Pleurx catheter insertion.  I also reviewed with him the indications and risks of Pleurx catheter insertion alone.  We also discussed the possibility of  performing continued thoracenteses.  After an  extensive discussion he and his daughter were unable to come up with any definitive answers as to how they would like to proceed.  At this point in time the EMS team had arrived and he will be transported to our emergency department for further evaluation of his supraventricular tach arrhythmia.  At that point if he is admitted to the hospital we can then proceed with additional inpatient evaluation.     Redmond Baseman, MD 09/27/2020, 9:53 AM

## 2020-09-27 NOTE — ED Notes (Signed)
Pt state

## 2020-09-27 NOTE — ED Provider Notes (Signed)
[facility] Emergency Department Provider Note ____________________________________________   First MD Initiated Contact with Patient 09/27/20 1021     (approximate)  I have reviewed the triage vital signs and the nursing notes.  HISTORY  Chief Complaint Chest Pain and Tachycardia   HPI Derrick Gallegos is a 69 y.o. malewho presents to the ED for evaluation of chest pain and tachycardia.   Chart review indicates patient was evaluated this morning by CT surgery due to a known left-sided pleural effusion, referred by pulmonology. 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  Worsening chronic shortness of breath, found to have left-sided pleural effusion. History of CAD status post stenting.  Anticoagulated on Xarelto due to atrial flutter.    Patient reports about 2 weeks of progressively worsening shortness of breath and aching chest pain.  Patient denies fever, cough, sputum production, syncope, vomiting or abdominal pain.  He reports poor p.o. intake due to poor appetite.  He reports inability to ambulate normal distances throughout his house due to dyspnea and generalized weakness.  Patient was transferred from CT surgery clinic to the ED due to tachycardia to the 130s-40s.  Past Medical History:  Diagnosis Date  . Anxiety   . Arthritis   . Atrial flutter ([place])    a. diagnosed 5/19; b. s/p TEE/DCCV 05/2018 by Dr. Humphrey Rolls; c. CHADS2VASc => 4 (HTN, age x1, DM, vascular disease)  . Complication of anesthesia    "wakes up too early"  . Coronary artery disease    a. s/p 3-v cabg in 2004 at [place] (LIMA and radial arery used - no further details in Care Everywhere); b. LHC 8/19 underlying multi-V dz, patent LIMA-LAD, occluded VG-PDA, 90-95% stenosis of LCx s/p PCI/DES x 2  . Cough   . Depression   . Diabetes mellitus with complication ([place])   . Dyspnea   . Dysrhythmia    atrial fib.  . Essential  hypertension   . Family history of premature CAD    a. father passed away from an MI at 3  . GERD (gastroesophageal reflux disease)   . Heart murmur   . HLD (hyperlipidemia)    a. statin intolerant   . Lung cancer ([place]) 09/2019   Chemo + Rad tx's.   . Mass of left lung   . MI (myocardial infarction) ([place])    2004  . Obesity     Patient Active Problem List   Diagnosis Date Noted  . Acute respiratory failure ([place]) 09/27/2020  . Pleural effusion 09/07/2020  . COPD suggested by initial evaluation ([place]) 09/07/2020  . Shortness of breath 06/14/2020  . Malignant neoplasm of lung ([place]) 06/14/2020  . Need for prophylactic vaccination and inoculation against influenza 11/25/2019  . Malignant neoplasm of upper lobe of left lung ([place]) 11/06/2019  . Goals of care, counseling/discussion 11/06/2019  . Atrial flutter ([place]) 03/31/2019  . Unstable angina ([place]) 08/08/2018  . CAD (coronary artery disease) 08/08/2018  . Coronary artery disease   . Family history of premature CAD   . Hyperlipidemia LDL goal <70   . Obesity   . Chest pain of uncertain etiology 24/58/0998  . DMII (diabetes mellitus, type 2) ([place]) 03/01/2015  . HTN (hypertension) 03/01/2015  . History of myocardial infarct at age less than 55 years 03/01/2015  . Pre-op evaluation 12/28/2014  . Chronic knee pain 10/24/2013    Past Surgical History:  Procedure Laterality Date  . CARDIAC CATHETERIZATION  2004  .  CARDIAC CATHETERIZATION  2019   x2 stents [facility]  . CARDIAC SURGERY    . CARDIOVERSION N/A 06/20/2018   Procedure: CARDIOVERSION;  Surgeon: Dionisio David, MD;  Location: [facility] ORS;  Service: Cardiovascular;  Laterality: N/A;  . CORONARY ARTERY BYPASS GRAFT    . CORONARY STENT INTERVENTION N/A 08/08/2018   Procedure: CORONARY STENT INTERVENTION;  Surgeon: Isaias Cowman, MD;  Location: [place] [facility];  Service: Cardiovascular;  Laterality: N/A;  . LEFT HEART CATH AND CORONARY ANGIOGRAPHY Left 08/08/2018    Procedure: LEFT HEART CATH AND CORONARY ANGIOGRAPHY;  Surgeon: Dionisio David, MD;  Location: [place] [facility];  Service: Cardiovascular;  Laterality: Left;  . PORTA CATH INSERTION N/A 11/10/2019   Procedure: PORTA CATH INSERTION;  Surgeon: Algernon Huxley, MD;  Location: [place] [facility];  Service: Cardiovascular;  Laterality: N/A;  . TEE WITHOUT CARDIOVERSION N/A 06/20/2018   Procedure: TRANSESOPHAGEAL ECHOCARDIOGRAM (TEE);  Surgeon: Dionisio David, MD;  Location: [facility] ORS;  Service: Cardiovascular;  Laterality: N/A;  . VIDEO BRONCHOSCOPY N/A 10/29/2019   Procedure: VIDEO BRONCHOSCOPY WITH ENDOBRONCHICAL ULTRASOUND;  Surgeon: Tyler Pita, MD;  Location: [facility] ORS;  Service: Cardiopulmonary;  Laterality: N/A;    Prior to Admission medications   Medication Sig Start Date End Date Taking? Authorizing Provider  acetaminophen (TYLENOL) 500 MG tablet Take 1,000 mg by mouth every 6 (six) hours as needed for moderate pain.     [provider]  empagliflozin (JARDIANCE) 25 MG TABS tablet Take 25 mg by mouth daily.    [provider]  ezetimibe (ZETIA) 10 MG tablet Take 1 tablet (10 mg total) by mouth daily. 07/02/20   End, Harrell Gave, MD  furosemide (LASIX) 20 MG tablet Take 1 tablet (20 mg total) by mouth daily. For one week 07/29/20 10/27/20  Loel Dubonnet, NP  gabapentin (NEURONTIN) 300 MG capsule Take 1 capsule (300 mg total) by mouth 3 (three) times daily. 07/26/20   Borders, Kirt Boys, NP  GLIPIZIDE XL 10 MG 24 hr tablet Take 10 mg by mouth 2 (two) times daily. 06/02/20   [provider]  HYDROcodone-homatropine (HYCODAN) 5-1.5 MG/5ML syrup Take 5 mLs by mouth every 6 (six) hours as needed for cough. 09/14/20   Borders, Kirt Boys, NP  ipratropium-albuterol (DUONEB) 0.5-2.5 (3) MG/3ML SOLN Take 3 mLs by nebulization every 6 (six) hours as needed. 09/07/20   Lauraine Rinne, NP  LORazepam (ATIVAN) 1 MG tablet Take 1 tablet (1 mg total) by mouth every 8 (eight) hours as  needed for anxiety. 09/14/20   Borders, Kirt Boys, NP  losartan (COZAAR) 50 MG tablet Take 50 mg by mouth daily.  09/29/19   [provider]  methimazole (TAPAZOLE) 10 MG tablet Take 10 mg by mouth in the morning and at bedtime. 04/20/20 04/20/21  [provider]  metoprolol tartrate (LOPRESSOR) 25 MG tablet Take 25 mg by mouth at bedtime.    [provider]  morphine (MS CONTIN) 30 MG 12 hr tablet Take 1 tablet (30 mg total) by mouth every 12 (twelve) hours. 09/16/20   Borders, Kirt Boys, NP  morphine (ROXANOL) 20 MG/ML concentrated solution Take 1 mL (20 mg total) by mouth every 4 (four) hours as needed for severe pain. 09/03/20   Borders, Kirt Boys, NP  naloxone [facility]) nasal spray 4 mg/0.1 mL 1 spray into nostril upon signs of opioid overdose. Call 911. May repeat once if no response within 2-3 minutes. 09/17/20   Borders, Kirt Boys,  NP  nitroGLYCERIN (NITROSTAT) 0.4 MG SL tablet Place 1 tablet (0.4 mg total) under the tongue every 5 (five) minutes as needed for chest pain. Maximum of 3 doses. 06/14/20   End, Harrell Gave, MD  Omega-3 Fatty Acids (FISH OIL) 1200 MG CAPS Take 2,400 mg by mouth at bedtime.     [provider]  pantoprazole (PROTONIX) 40 MG tablet Take 1 tablet (40 mg total) by mouth daily. 08/20/20   Borders, Kirt Boys, NP  prochlorperazine (COMPAZINE) 10 MG tablet Take 1 tablet (10 mg total) by mouth every 6 (six) hours as needed for nausea or vomiting. 09/20/20   Sindy Guadeloupe, MD  rivaroxaban (XARELTO) 20 MG TABS tablet Take 1 tablet (20 mg total) by mouth daily with supper. 06/09/20   End, Harrell Gave, MD  sertraline (ZOLOFT) 50 MG tablet Take 1 tablet (50 mg total) by mouth daily. 09/23/20   Sindy Guadeloupe, MD    Allergies Patient has no known allergies.  Family History  Problem Relation Age of Onset  . CAD Mother   . CAD Father        a. MI at 57-->death  . CAD Brother     Social History Social History   Tobacco Use  . Smoking status: Former  Smoker    Packs/day: 3.00    Types: Cigarettes    Quit date: 2004    Years since quitting: 17.7  . Smokeless tobacco: Current User  . Tobacco comment: Quit 2004  Vaping Use  . Vaping Use: Never used  Substance Use Topics  . Alcohol use: No  . Drug use: No    Review of Systems  Constitutional: No fever/chills.  Positive generalized weakness. Eyes: No visual changes. ENT: No sore throat. Cardiovascular: Positive chest pain Respiratory: Positive shortness of breath Gastrointestinal: No abdominal pain.  No nausea, no vomiting.  No diarrhea.  No constipation. Genitourinary: Negative for dysuria. Musculoskeletal: Negative for back pain. Skin: Negative for rash. Neurological: Negative for headaches, focal weakness or numbness.  ____________________________________________   PHYSICAL EXAM:  VITAL SIGNS: Vitals:   09/27/20 1330 09/27/20 1400  BP: 97/69 140/89  Pulse: 88 (!) 104  Resp: 20 (!) 21  Temp:    SpO2: 96% 99%      Constitutional: Alert and oriented.  Uncomfortable-appearing.  Preferring to sit up on the side of the bed with his legs hanging over the side.  Speaking in full sentences. Eyes: Conjunctivae are normal. PERRL. EOMI. Head: Atraumatic. Nose: No congestion/rhinnorhea. Mouth/Throat: Mucous membranes are moist.  Oropharynx non-erythematous. Neck: No stridor. No cervical spine tenderness to palpation. Cardiovascular: Tachycardic rate, irregular rhythm. Grossly normal heart sounds.  Good peripheral circulation. Respiratory: No retractions.  Requiring 4-6 L nasal cannula.  Tachypnea to the mid 20s.  Absent breath sounds on the left, clear on the right. Gastrointestinal: Soft , nondistended, nontender to palpation. No abdominal bruits. No CVA tenderness. Musculoskeletal: No lower extremity tenderness.  No joint effusions. No signs of acute trauma. Neurologic:  Normal speech and language. No gross focal neurologic deficits are appreciated. No gait instability  noted. Skin:  Skin is warm, dry and intact. No rash noted. Psychiatric: Mood and affect are normal. Speech and behavior are normal.  ____________________________________________   LABS (all labs ordered are listed, but only abnormal results are displayed)  Labs Reviewed  CBC WITH DIFFERENTIAL/PLATELET - Abnormal; Notable for the following components:      Result Value   Hemoglobin 12.3 (*)    HCT 37.9 (*)  RDW 16.3 (*)    Lymphs Abs 0.6 (*)    All other components within normal limits  COMPREHENSIVE METABOLIC PANEL - Abnormal; Notable for the following components:   Sodium 133 (*)    Glucose, Bld 208 (*)    AST 13 (*)    All other components within normal limits  RESPIRATORY PANEL BY RT PCR (FLU A&B, COVID)  LACTIC ACID, PLASMA  MAGNESIUM  TROPONIN I (HIGH SENSITIVITY)  TROPONIN I (HIGH SENSITIVITY)  TROPONIN I (HIGH SENSITIVITY)   ____________________________________________  12 [place] EKG  A flutter with 2-1 block, rate of 126 bpm.  Normal axis.  Evidence of right bundle branch block.  No evidence of acute ischemia. ____________________________________________  RADIOLOGY  ED MD interpretation:   CXR with complete opacification of left hemothorax  Official radiology report(s): CT Chest Wo Contrast  Result Date: 09/27/2020 CLINICAL DATA:  History of lung cancer.  Pneumonia. EXAM: CT CHEST WITHOUT CONTRAST TECHNIQUE: Multidetector CT imaging of the chest was performed following the standard protocol without IV contrast. COMPARISON:  August 17, 2020. FINDINGS: Cardiovascular: Status post coronary artery bypass graft. Atherosclerosis of thoracic aorta without aneurysm formation. Normal cardiac size. Minimal pericardial effusion may be present. Mediastinum/Nodes: Thyroid gland is unremarkable. Small sliding-type hiatal hernia is noted. Stable mildly enlarged mediastinal adenopathy is noted which may be reactive or metastatic in etiology. Lungs/Pleura: No pneumothorax is noted.  Moderate size loculated left pleural effusion is noted. Moderate right pleural effusion is noted as well. Increased left upper lobe airspace opacity is noted concerning for worsening atelectasis or pneumonia. Some degree of radiation fibrosis may be present as well. Multiple nodules are noted in the right lung base in both the right middle and lower lobes which are increased compared to prior exam and may represent metastatic disease or possibly increased inflammation. Increased interstitial densities and opacities are noted in the right upper lobe which may represent possible pneumonia. Upper Abdomen: No acute abnormality. Musculoskeletal: No chest wall mass or suspicious bone lesions identified. IMPRESSION: 1. Moderate size loculated left pleural effusion is noted. 2. Moderate right pleural effusion is noted as well. 3. Increased left upper lobe airspace opacity is noted concerning for worsening atelectasis or pneumonia. Some degree of radiation fibrosis may be present as well. 4. Multiple nodules are noted in the right lung base in both the right middle and lower lobes which are increased compared to prior exam and may represent metastatic disease or possibly increased inflammation. 5. Increased interstitial densities and opacities are noted in the right upper lobe which may represent possible pneumonia. 6. Stable mildly enlarged mediastinal adenopathy is noted which may be reactive or metastatic in etiology. 7. Small sliding-type hiatal hernia. 8. Aortic atherosclerosis. Aortic Atherosclerosis (ICD10-I70.0). Electronically Signed   By: Marijo Conception M.D.   On: 09/27/2020 12:48   DG Chest Portable 1 View  Result Date: 09/27/2020 CLINICAL DATA:  Left pleural effusion, known left lung cancer EXAM: PORTABLE CHEST 1 VIEW COMPARISON:  09/07/2020 FINDINGS: Increased, near total opacification of the left hemithorax. Persistent right lung interstitial prominence. Cardiomediastinal contours remain partially  obscured. No pneumothorax. Right chest wall port catheter is unchanged. IMPRESSION: Increased, near total opacification of the left hemithorax, which may reflect combination of pleural effusion and atelectasis. Persistent nonspecific right lung interstitial prominence. Electronically Signed   By: Macy Mis M.D.   On: 09/27/2020 11:01    ____________________________________________   PROCEDURES and INTERVENTIONS  Procedure(s) performed (including Critical Care):  .1-3 [place] EKG Interpretation Performed by:  Vladimir Crofts, MD Authorized by: Vladimir Crofts, MD     Interpretation: abnormal     ECG rate:  110   ECG rate assessment: tachycardic     Rhythm: atrial flutter     Ectopy: PVCs     Conduction: abnormal (RBBB)   .Critical Care Performed by: Vladimir Crofts, MD Authorized by: Vladimir Crofts, MD   Critical care provider statement:    Critical care time (minutes):  45   Critical care was necessary to treat or prevent imminent or life-threatening deterioration of the following conditions:  Circulatory failure and respiratory failure   Critical care was time spent personally by me on the following activities:  Discussions with consultants, evaluation of patient's response to treatment, examination of patient, ordering and performing treatments and interventions, ordering and review of laboratory studies, ordering and review of radiographic studies, pulse oximetry, re-evaluation of patient's condition, obtaining history from patient or surrogate and review of old charts CHEST TUBE INSERTION  Date/Time: 09/27/2020 3:54 PM Performed by: Vladimir Crofts, MD Authorized by: Vladimir Crofts, MD   Consent:    Consent obtained:  Written   Consent given by:  Patient   Risks discussed:  Bleeding, incomplete drainage, pain, infection and damage to surrounding structures   Alternatives discussed:  No treatment Pre-procedure details:    Skin preparation:  ChloraPrep   Preparation: Patient was prepped  and draped in the usual sterile fashion   Sedation:    Sedation type:  Anxiolysis Anesthesia (see MAR for exact dosages):    Anesthesia method:  Local infiltration   Local anesthetic:  Lidocaine 1% WITH epi Procedure details:    Placement location:  L lateral Comments:     18-gauge needle on 10 cc syringe was utilized to access the pleural space from the anterior axillary line at the fourth intercostal space.  Needle was advanced until rib was encountered, and then inserted superior to this into the pleural space.  Small amount of bloody return, but no release of pressure that I would expect from accessing a significant pleural effusion.  I redirect one time, and encounter similar results.  Patient tolerates this well, but I would expect a large return of fluid based of his CXR results.  Due to this discrepancy, I stopped the procedure at this point and sent the patient to a CT scan.    Medications  lidocaine-EPINEPHrine (XYLOCAINE W/EPI) 2 %-1:100000 (with pres) injection 20 mL (20 mLs Other Given 09/27/20 1147)  fentaNYL (SUBLIMAZE) injection 50 mcg (50 mcg Intravenous Given 09/27/20 1147)    ____________________________________________   MDM / ED COURSE  69 year old male with known bronchogenic carcinoma presents from CT surgery clinic due to tachycardia in the office, found to have complete opacification of his left hemithorax, likely due to bronchial obstruction from known cancer progressing, and patient ultimately leaves AMA from the ED and refusing medical admission.  Patient presented tachycardic in A. fib with RVR, but hemodynamically stable.  His tachycardia improved with analgesia.  Blood work is largely unremarkable without acute pathology.  CXR shows opacification of his left hemithorax, concerning for large pleural effusion.  I attempted pigtail catheter placement, as documented above, but do not hit a discrete fluid collection and therefore abort the procedure in order to not  cause any harm to the patient.  CT imaging performed after this, and demonstrates complex pathology within his left chest and only a small basilar pleural effusion.  Touch base with CT surgery, who assessed the patient in person and offers  thoracoscopy with talc pleurodesis and biopsy, but patient declines and requests to go home and "to die with my family."  Patient has full capacity to make decision, and son is at the bedside and agrees that patient is at his baseline mental status and has capacity.  He indicates understanding that he will likely deteriorate rapidly at home, but reports he does not want to be admitted.  We discussed following up with his oncologist, and I provided him with information for palliative/hospice care as it sounds like he would most benefit from hospice and comfort care.  We discussed return precautions for the ED.  Patient leaves AMA with his son.  Clinical Course as of Sep 27 1550  Mon Sep 27, 2020  1117 Spoke with Dr. Genevive Bi, CT surg, who saw the patient this AM.  We discussed patient's presentation with total opacity of his left lung field precipitating his a flutter and hypoxia.  He recommends thoracentesis drainage, either straight stick or more preferably pigtail catheter.  He reports no urgent needs for Pleurx placement at this time, but he will likely need in the future.   [DS]  1209 Empiric left-sided chest tube placement based off of CXR results and his tachypnea/distress.  Pigtail chest tube attempted, as above.  Due to relative dry tap, and no discrete fluid collection I would expect based off his CXR results, procedure was paused and patient was sent to CT to further assess his left hemithorax opacity.  [DS]  60 Educated patient and son on CT findings.  Patient sitting up in the side of the bed with legs hanging over.  No distress.  [DS]  1331 Spoke with IR physician on call, who reviewed the images with me, and indicates that he is happy to facilitate a  thoracentesis tomorrow, but there is no significant fluid collection to perform pigtail chest tube at this time emergently, and indicates that the fluid "is the least of his concerns" due to his apparent complete obstruction of his left mainstem bronchus from bronchogenic carcinoma.  [DS]  7026 Patient requesting to leave AMA.  I go to the bedside multiple times over the course of 30 minutes to speak to the patient and his son.  Dr. Faith Rogue with CT surgery also speaks with him.  Patient reports understanding that he is dying of cancer, and he wishes to go home with his family.  I believe he has capacity to make this decision.  He is staying on nasal cannula and is normoxic, not hypotensive and minimally tachycardic.  He sits up in the side of the bed and insists that he wishes to go home.  Son is at the bedside and reports that he is acting appropriately and agrees that the patient seems to have capacity.  I enforce my education and recommendation that he should be admitted medically, but he requests to be discharged.   [DS]    Clinical Course User Index [DS] Vladimir Crofts, MD     ____________________________________________   FINAL CLINICAL IMPRESSION(S) / ED DIAGNOSES  Final diagnoses:  Loculated pleural effusion  Other chest pain  Shortness of breath  Hypoxia  Bronchogenic cancer of left lung [facility])     ED Discharge Orders    None       Kynisha Memon   Note:  This document was prepared using Dragon voice recognition software and may include unintentional dictation errors.   Vladimir Crofts, MD 09/27/20 [phone]

## 2020-09-28 ENCOUNTER — Encounter: Payer: Self-pay | Admitting: Oncology

## 2020-09-28 ENCOUNTER — Other Ambulatory Visit: Payer: Self-pay

## 2020-09-28 MED ORDER — NALOXEGOL OXALATE 12.5 MG PO TABS: 12.5000 mg | ORAL_TABLET | Freq: Every day | ORAL | 0 refills | Status: AC

## 2020-09-28 MED ORDER — LORAZEPAM 1 MG PO TABS: 1.0000 mg | ORAL_TABLET | Freq: Three times a day (TID) | ORAL | 0 refills | Status: DC | PRN

## 2020-09-28 MED ORDER — HYDROCODONE-HOMATROPINE 5-1.5 MG/5ML PO SYRP
5.0000 mL | ORAL_SOLUTION | Freq: Four times a day (QID) | ORAL | 0 refills | Status: DC | PRN
Start: 2020-09-28 — End: 2020-09-28

## 2020-09-28 NOTE — Telephone Encounter (Signed)
I spoke with patient's daughter and then spoke with patient and wife by phone.  Patient was seen yesterday in the ER and felt that he was told that there were no other medical options short of keeping him comfortable under hospice care.  However, today patient verbalized a desire to pursue anything procedurally available with Dr. Genevive Bi that could be done for symptom relief or diagnostic purposes.  Message was relayed to Drs. Wyona Almas, and [place]

## 2020-09-29 ENCOUNTER — Telehealth: Payer: Self-pay | Admitting: *Deleted

## 2020-09-29 ENCOUNTER — Encounter: Payer: Self-pay | Admitting: Oncology

## 2020-09-29 MED ORDER — HYDROCODONE-HOMATROPINE 5-1.5 MG/5ML PO SYRP
5.0000 mL | ORAL_SOLUTION | Freq: Four times a day (QID) | ORAL | 0 refills | Status: DC | PRN
Start: 2020-09-29 — End: 2020-10-14

## 2020-09-29 NOTE — Telephone Encounter (Signed)
Hassan Rowan can you please see which pharmacy we can send it to and do the needful?

## 2020-09-29 NOTE — Telephone Encounter (Signed)
[facility] called reporting that they do not have any Hycodan cough syrup and request that it be sent to another pharmacy as they cannot transfer a controlled substance

## 2020-09-29 NOTE — Telephone Encounter (Signed)
I see that it was sent to [facility] yesterday, then to [facility] this morning, so no need to do anything else

## 2020-09-30 ENCOUNTER — Other Ambulatory Visit: Payer: Self-pay | Admitting: *Deleted

## 2020-09-30 ENCOUNTER — Ambulatory Visit
Admission: RE | Admit: 2020-09-30 | Discharge: 2020-09-30 | Disposition: A | Discharge status: Home / Self Care | Payer: Medicare Other | Source: Ambulatory Visit | Attending: Cardiothoracic Surgery | Admitting: Cardiothoracic Surgery

## 2020-09-30 ENCOUNTER — Other Ambulatory Visit: Payer: Self-pay | Admitting: Emergency Medicine

## 2020-09-30 ENCOUNTER — Ambulatory Visit
Admission: RE | Admit: 2020-09-30 | Discharge: 2020-09-30 | Disposition: A | Discharge status: Home / Self Care | Payer: Medicare Other | Attending: Cardiothoracic Surgery | Admitting: Cardiothoracic Surgery

## 2020-09-30 ENCOUNTER — Other Ambulatory Visit: Payer: Self-pay

## 2020-09-30 DIAGNOSIS — J90 Pleural effusion, not elsewhere classified: Secondary | ICD-10-CM | POA: Diagnosis not present

## 2020-09-30 MED ORDER — MORPHINE SULFATE (CONCENTRATE) 20 MG/ML PO SOLN
20.0000 mg | ORAL | 0 refills | Status: DC | PRN
Start: 2020-09-30 — End: 2020-10-14

## 2020-09-30 NOTE — Progress Notes (Signed)
Chest  

## 2020-10-01 ENCOUNTER — Encounter: Payer: Self-pay | Admitting: Cardiothoracic Surgery

## 2020-10-01 ENCOUNTER — Other Ambulatory Visit: Payer: Self-pay | Admitting: Emergency Medicine

## 2020-10-01 ENCOUNTER — Telehealth: Payer: Self-pay | Admitting: Emergency Medicine

## 2020-10-01 ENCOUNTER — Other Ambulatory Visit
Admission: RE | Admit: 2020-10-01 | Discharge: 2020-10-01 | Disposition: A | Discharge status: Home / Self Care | Payer: Medicare Other | Source: Ambulatory Visit | Attending: Cardiothoracic Surgery | Admitting: Cardiothoracic Surgery

## 2020-10-01 ENCOUNTER — Ambulatory Visit (INDEPENDENT_AMBULATORY_CARE_PROVIDER_SITE_OTHER): Payer: Medicare Other | Admitting: Cardiothoracic Surgery

## 2020-10-01 VITALS — BP 104/69 | HR 90 | Temp 97.9°F | Resp 14 | Wt 225.0 lb

## 2020-10-01 DIAGNOSIS — J90 Pleural effusion, not elsewhere classified: Secondary | ICD-10-CM

## 2020-10-01 DIAGNOSIS — Z20822 Contact with and (suspected) exposure to covid-19: Secondary | ICD-10-CM | POA: Insufficient documentation

## 2020-10-01 DIAGNOSIS — I2510 Atherosclerotic heart disease of native coronary artery without angina pectoris: Secondary | ICD-10-CM | POA: Diagnosis not present

## 2020-10-01 DIAGNOSIS — Z01812 Encounter for preprocedural laboratory examination: Secondary | ICD-10-CM | POA: Insufficient documentation

## 2020-10-01 LAB — SARS CORONAVIRUS 2 (TAT 6-24 HRS): SARS Coronavirus 2: NEGATIVE

## 2020-10-01 NOTE — Progress Notes (Signed)
[facility] Follow Up Note  Patient ID: Derrick Gallegos, male   DOB: Sep 29, 1951, 69 y.o.   MRN: [id number]  HISTORY: He returns today in follow-up.  He continues to complain of significant shortness of breath.  His vital signs today were normal with an oxygen saturation of 91%.  He did have a chest x-ray made yesterday.  It is essentially unchanged to my independent review.    Vitals:   10/01/20 0754  BP: 104/69  Pulse: 90  Resp: 14  Temp: 97.9 F (36.6 C)  SpO2: 91%     EXAM:  Resp: Lungs are clear on the right but diminished throughout on the left.  No respiratory distress, normal effort. Heart:  Regular without murmurs Abd:  Abdomen is soft, non distended and non tender. No masses are palpable.  There is no rebound and no guarding.  Neurological: Alert and oriented to person, place, and time. Coordination normal.  Skin: Skin is warm and dry. No rash noted. No diaphoretic. No erythema. No pallor.  Psychiatric: Normal mood and affect. Normal behavior. Judgment and thought content normal.    Independent review of his chest x-ray shows extensive volume loss on the left with a loculated pleural effusion  ASSESSMENT: Loculated left pleural effusion with consolidation of left upper lobe status post chemoradiation for lung cancer   PLAN:   I reviewed with the patient the indications and risks of the various treatment options for both diagnosis and therapy.  These included bronchoscopy to assess the endobronchial anatomy to better understand why the left upper lobe is collapsed.  This may give [place] additional information as well regarding his overall cancer status.  I also reviewed with him the option of thoracentesis with sampling of the pleural fluid.  We can then assess whether or not the lung will reexpand after that is completed.  A final option would be thoracoscopy or thoracotomy for pleural biopsy and possible talc pleurodesis or Pleurx catheter insertion.  I reviewed with him the  indications and risks as well as the advantages and disadvantages of these techniques for both definitive diagnosis and for management of symptoms.  He appeared to understand.  He was accompanied today by his daughter.  Ultimately they have decided for an image guided thoracentesis with subsequent follow-up with me.  The patient is on Xarelto we will need to get that cleared with our interventional radiologist prior to his thoracentesis.    Nestor Lewandowsky, MD

## 2020-10-01 NOTE — Patient Instructions (Addendum)
We will schedule you for a thoracentesis. We will also let you know when to stop your Xarelto. Once we have this appointment we will call you to let you know and also schedule a follow up visit.   Call the office if you have any questions or concerns.   Flexible Bronchoscopy  Flexible bronchoscopy is a procedure that is used to examine the passageways in the lungs. During the procedure, a thin, flexible tool with a camera on it (bronchoscope) is passed into the mouth or nose, down through the windpipe (trachea), and into the air tubes (bronchi) in the lungs. This tool allows your health care provider to look at your lungs from the inside and take testing (diagnostic) samples if needed. Tell a health care provider about:  Any allergies you have.  All medicines you are taking, including vitamins, herbs, eye drops, creams, and over-the-counter medicines.  Any problems you or family members have had with anesthetic medicines.  Any blood disorders you have.  Any surgeries you have had.  Any medical conditions you have.  Whether you are pregnant or may be pregnant. What are the risks? Generally, this is a safe procedure. However, problems may occur, including:  Infection.  Bleeding.  Damage to other structures or organs.  Allergic reactions to medicines.  Collapsed lung (pneumothorax).  Increased need for oxygen or difficulty breathing after the procedure. What happens before the procedure? Medicines Ask your health care provider about:  Changing or stopping your regular medicines. This is especially important if you are taking diabetes medicines or blood thinners.  Taking medicines such as aspirin and ibuprofen. These medicines can thin your blood. Do not take these medicines before your procedure if your health care provider instructs you not to. You may be given antibiotic medicine to help prevent infection. Staying hydrated Follow instructions from your health care provider  about hydration, which may include:  Up to 2 hours before the procedure - you may continue to drink clear liquids, such as water, clear fruit juice, black coffee, and plain tea. Eating and drinking Follow instructions from your health care provider about eating and drinking, which may include:  8 hours before the procedure - stop eating heavy meals or foods such as meat, fried foods, or fatty foods.  6 hours before the procedure - stop eating light meals or foods, such as toast or cereal.  6 hours before the procedure - stop drinking milk or drinks that contain milk.  2 hours before the procedure - stop drinking clear liquids. General instructions  Plan to have someone take you home from the hospital or clinic.  If you will be going home right after the procedure, plan to have someone with you for 24 hours. What happens during the procedure?  To lower your risk of infection: ? Your health care team will wash or sanitize their hands. ? Your skin will be washed with soap.  An IV tube will be inserted into one of your veins.  You will be given a medicine (local anesthetic) to numb your mouth, nose, throat, and voice box (larynx). You may also be given one or more of the following: ? A medicine to help you relax (sedative). ? A medicine to control coughing. ? A medicine to dry up any fluids in your lungs (secretions).  A bronchoscope will be passed into your nose or mouth, and into your lungs. Your health care provider will examine your lungs.  Samples of airway secretions may be collected for  testing.  If abnormal areas are seen in your airways, tissue samples may be removed for examination under a microscope (biopsy).  If tissue samples are needed from the outer parts of the lung, a type of X-ray (fluoroscopy) may be used to guide the bronchoscope to these areas.  If bleeding occurs, you may be given medicine to stop or decrease the bleeding. The procedure may vary among health  care providers and hospitals. What happens after the procedure?  Do not drive for 24 hours if you were given a sedative.  Your blood pressure, heart rate, breathing rate, and blood oxygen level will be monitored until the medicines you were given have worn off.  You may have a chest X-ray to check for signs of pneumothorax.  You will not be allowed to eat or drink anything for 2 hours after your procedure.  If a biopsy was taken, it is up to you to get the results of your procedure. Ask your health care provider, or the department that is doing the procedure, when your results will be ready. Summary  Flexible bronchoscopy is a procedure that allows your health care provider to look closely at your lungs from the inside and take testing (diagnostic) samples if needed.  Risks of flexible bronchoscopy include bleeding, infection, and pneumothorax.  Before a flexible bronchoscopy, you will be given a medicine (local anesthetic) to numb your mouth, nose, throat, and voice box (larynx). Then, a bronchoscope will be passed into your nose or mouth, and into your lungs.  After the procedure, your blood pressure, heart rate, breathing rate, and blood oxygen level will be monitored until the medicines you were given have worn off. You may have a chest X-ray to check for signs of pneumothorax.  You will not be allowed to eat or drink anything for 2 hours after your procedure. This information is not intended to replace advice given to you by your health care provider. Make sure you discuss any questions you have with your health care provider. Document Revised: 11/23/2017 Document Reviewed: 01/13/2017 Elsevier Patient Education  [place].  Thoracentesis  A thoracentesis is a procedure to remove excess fluid that has built up in the space between the linings of the chest wall and the lungs (pleural space). It is normal to have a small amount of fluid in the pleural space. Some medical  conditions, such as heart failure, pneumonia, kidney problems, or cancer, can create too much fluid. This extra fluid is removed using a needle that is inserted through the skin and tissue and into the pleural space. A thoracentesis may be done to:  Understand why there is extra fluid in the pleural space and to create a treatment plan that is right for you.  Help get rid of shortness of breath, discomfort, or pain that is caused by the extra fluid. Tell a health care provider about:  Any allergies you have.  All medicines you are taking, including vitamins, herbs, eye drops, creams, and over-the-counter medicines. This includes any use of steroids, either by mouth or as a cream.  Any problems you or family members have had with anesthetic medicines.  Any blood disorders you have, including any history of blood clots.  Any surgeries you have had.  Medical conditions you have, including a frequent cough or coughing episodes.  Whether you are pregnant or may be pregnant. What are the risks? Generally, this is a safe procedure. However, problems may occur, including:  Infection.  Bleeding.  Injury  to the lung.  Injury to surrounding tissues or organs.  Collapse of the lung. What happens before the procedure?  Follow instructions from your health care provider about hydration, which may include: ? Up to 2 hours before the procedure - you may continue to drink clear liquids, such as water, clear fruit juice, black coffee, and plain tea. Eating and drinking restrictions Follow instructions from your health care provider about eating and drinking, which may include:  8 hours before the procedure - stop eating heavy meals or foods such as meat, fried foods, or fatty foods.  6 hours before the procedure - stop eating light meals or foods, such as toast or cereal.  6 hours before the procedure - stop drinking milk or drinks that contain milk.  2 hours before the procedure - stop  drinking clear liquids. Medicines Ask your health care provider about:  Changing or stopping your regular medicines. This is especially important if you are taking diabetes medicines or blood thinners.  Taking medicines such as aspirin and ibuprofen. These medicines can thin your blood. Do not take these medicines unless your health care provider tells you to take them.  Taking over-the-counter medicines, vitamins, herbs, and supplements.  Taking a cough suppressant if you have a frequent cough or coughing episodes. General instructions  You may have a chest X-ray or another imaging test, such as a CT scan or ultrasound, to determine the location and amount of fluid in your pleural space.  Plan to have someone take you home from the hospital or clinic.  Plan to have a responsible adult care for you for at least 24 hours after you leave the hospital or clinic. This is important.  Ask your health care provider what steps will be taken to help prevent infection. These may include: ? Removing hair at the needle-insertion site. ? Washing skin with a germ-killing soap. What happens during the procedure?  You will be asked to sit upright and lean slightly forward for the procedure.  An IV will be inserted into one of your veins.  You will be given one or both of the following: ? A medicine to help you relax (sedative). ? A medicine to numb the area (local anesthetic).  The health care provider will insert a needle into your back so that it goes between the ribs and into the pleural space. You may feel pressure or slight pain as the needle is positioned into the pleural space.  Fluid will be removed from the pleural space through the needle. You may feel pressure as the fluid is removed.  The health care provider will take the needle out after the excess fluid has been removed. A sample of the fluid may be sent to the lab for testing.  The needle insertion site (puncture site) will be  covered with a bandage (dressing). The procedure may vary among health care providers and hospitals. What happens after the procedure?  Your blood pressure, heart rate, breathing rate, and blood oxygen level will be monitored until you leave the hospital or clinic.  A chest X-ray may be done to check the amount of fluid that remains in your pleural space.  If a sample of fluid was sent for testing, ask your health care provider, or the department that did the procedure, when your results will be ready. It is up to you to get your test results.  Do not drive for 24 hours if you were given a sedative during your procedure. Summary  A thoracentesis is a procedure to remove excess fluid that has built up in the space between the linings of the chest wall and the lungs (pleural space).  Some medical conditions, such as heart failure, pneumonia, kidney problems, or cancer, can create too much fluid.  For the procedure, a needle will be inserted between your ribs and into the pleural space. Fluid will be removed from the pleural space through the needle. The fluid may be sent to a lab for testing.  After the procedure, you may have a chest X-ray to check the amount of fluid still in your pleural space. This information is not intended to replace advice given to you by your health care provider. Make sure you discuss any questions you have with your health care provider. Document Revised: 11/23/2017 Document Reviewed: 11/05/2017 Elsevier Patient Education  2020 [place].  Thoracotomy Thoracotomy is surgery to open the chest to get access to the organs and tissue inside. This type of surgery is often used to repair or treat the lungs, heart, or arteries, or to remove tissue. Tell a health care provider about: Any allergies you have. All medicines you are taking, including vitamins, herbs, eye drops, creams, and over-the-counter medicines. Any problems you or family members have had with  anesthetic medicines. Any blood disorders you have. Any surgeries you have had. Any medical conditions you have. Whether you are pregnant or may be pregnant. What are the risks? Generally, this is a safe procedure. However, problems may occur, including: Infection. Severe bleeding (hemorrhage). Allergic reaction to medicines. Damage to other structures or organs, such as nerves. Abnormal heart rhythm (arrhythmia). Respiratory failure. This is when not enough oxygen passes from the lungs into the blood. In the case of this procedure, this is most often caused by lung collapse. What happens before the procedure? Medicines Ask your health care provider about: Changing or stopping your regular medicines. This is especially important if you are taking diabetes medicines or blood thinners. Taking medicines such as aspirin and ibuprofen. These medicines can thin your blood. Do not take these medicines before your procedure if your health care provider instructs you not to. You may be given antibiotic medicine to help prevent infection. Staying hydrated Follow instructions from your health care provider about hydration, which may include: Up to 2 hours before the procedure - you may continue to drink clear liquids, such as water, clear fruit juice, black coffee, and plain tea. Eating and drinking restrictions Follow instructions from your health care provider about eating and drinking, which may include: 8 hours before the procedure - stop eating heavy meals or foods such as meat, fried foods, or fatty foods. 6 hours before the procedure - stop eating light meals or foods, such as toast or cereal. 6 hours before the procedure - stop drinking milk or drinks that contain milk. 2 hours before the procedure - stop drinking clear liquids. General instructions  You may have tests, such as: X-rays. MRI. CT scan. Tests of mucus from your lungs (sputum culture). Blood tests. Lung (pulmonary)  function tests. A test of heart function and rhythm (electrocardiogram, ECG). A test to evaluate the blood vessels in your lungs (pulmonary angiogram). You may be asked to shower with a germ-killing soap. Ask your health care provider how your surgical site will be marked or identified. Plan to have someone take you home from the hospital. Do not use any products that contain nicotine or tobacco, such as cigarettes and e-cigarettes. If you need  help quitting, ask your health care provider. What happens during the procedure? To lower your risk of infection: Your health care team will wash or sanitize their hands. Your skin will be washed with soap. An IV tube will be inserted into one of your veins. You will be given a medicine to make you fall asleep (general anesthetic). You may also be given a medicine to help you relax (sedative). A thin tube (catheter) will be inserted into your urethra and bladder to drain your urine. A tube will be placed down your throat to help you breathe. A 5-10 inch (13-25 cm) incision will be made in your chest. The size and exact location of the incision varies depending on the purpose of the procedure. A tool (retractor) will be used to separate muscle, tissue, and in some cases, your rib cage. This is done to create easier access to organs and tissue. Any damaged or diseased tissue inside of your chest will be removed. A chest tube will be inserted between your ribs to drain fluid. This helps to prevent fluid buildup in your lungs. Your incision will be closed with stitches (sutures) or staples. A bandage (dressing) may be placed over the incision. The procedure may vary among health care providers and hospitals. What happens after the procedure? Your blood pressure, heart rate, breathing rate, and blood oxygen level will be monitored. You will be given pain medicine as needed. You will continue to have a chest tube draining fluid from your lungs for 24-48  hours. You will be monitored closely for signs of fluid buildup in your lungs. You may continue: To have a breathing tube. To receive fluids and medicines through an IV tube. To have a catheter draining your urine. You may have to wear compression stockings. These stockings help to prevent blood clots and reduce swelling in your legs. You may be shown how to do breathing exercises and how to use a tool that measures how well you are filling your lungs with each breath (incentive spirometer). These can help prevent pneumonia. Summary Thoracotomy is surgery to open the chest to get access to the organs and tissue inside. This type of surgery is often used to repair or treat the lungs, heart, or arteries, or to remove tissue. During the procedure, a 5-10 inch (13-25 cm) incision will be made in your chest. The size and exact location of the incision varies depending on the purpose of the procedure. After this procedure, you will continue to have a chest tube draining fluid from your lungs for 24-48 hours. You will be monitored closely for signs of fluid buildup in your lungs. This information is not intended to replace advice given to you by your health care provider. Make sure you discuss any questions you have with your health care provider. Document Revised: 11/23/2017 Document Reviewed: 09/04/2016 Elsevier Patient Education  2020 [employer].  Thoracoscopy Thoracoscopy is a procedure to examine the lungs and other structures in a person's chest. It involves the use of a thin, lighted telescope with an attached camera (thoracoscope). Your health care provider inserts this instrument through a small surgical cut (incision) in your chest wall. You may have thoracoscopy to:  Diagnose conditions in the lungs and chest area.  Obtain a tissue sample to be examined under a microscope (biopsy).  Have medicines put directly into your lungs.  Remove fluid, blood, or pus from your lungs.  Perform  minor surgical repairs. Tell a health care provider about:  Any allergies  you have.  All medicines you are taking, including vitamins, herbs, eye drops, creams, and over-the-counter medicines.  Any problems you or family members have had with anesthetic medicines.  Any blood disorders you have.  Any surgeries you have had.  Any medical conditions you have.  Whether you are pregnant or may be pregnant. What are the risks? Generally, this is a safe procedure. However, problems may occur, including:  Bleeding. If this happens, your health care provider may need to open your chest with a large incision (thoracotomy) to control the bleeding.  Injury to nerves or other structures in your chest.  Collapsed lung. This can happen after the removal of the chest tube that is used for the procedure. The tube may need to be reinserted and left in place for a period of time.  Infection.  Heart rhythm abnormalities. What happens before the procedure? Staying hydrated Follow instructions from your health care provider about hydration, which may include:  Up to 2 hours before the procedure - you may continue to drink clear liquids, such as water, clear fruit juice, black coffee, and plain tea. Eating and drinking restrictions Follow instructions from your health care provider about eating and drinking, which may include:  8 hours before the procedure - stop eating heavy meals or foods such as meat, fried foods, or fatty foods.  6 hours before the procedure - stop eating light meals or foods, such as toast or cereal.  6 hours before the procedure - stop drinking milk or drinks that contain milk.  2 hours before the procedure - stop drinking clear liquids. Medicines Ask your health care provider about:  Changing or stopping your regular medicines. This is especially important if you are taking diabetes medicines or blood thinners.  Taking medicines such as aspirin and ibuprofen. These  medicines can thin your blood. Do not take these medicines unless your health care provider tells you to take them.  Taking over-the-counter medicines, vitamins, herbs, and supplements. General instructions  Plan to have someone take you home from the hospital or clinic.  Plan to have a responsible adult care for you for at least 24 hours after you leave the hospital or clinic. This is important.  You may need to have tests prior to the procedure. These include: ? Blood tests. ? Urine tests. ? Chest X-rays. ? Electrocardiogram (ECG).  Ask your health care provider what steps will be taken to help prevent infection. These may include: ? Removing hair at the surgery site. ? Washing skin with a germ-killing soap. ? Antibiotic medicine. What happens during the procedure?   An IV will be inserted into one of your veins.  You will be given one or more of the following: ? A medicine to help you relax (sedative). ? A medicine to numb the area (local anesthetic). ? A medicine to make you fall asleep (general anesthetic).  Your health care provider will make two or three small incisions on the side of the chest that is being examined.  Your health care provider will place the thoracoscope through one of the incisions and use it to look inside your chest. The camera will project the images from the scope to a monitor in the operating room.  Your health care provider will use the other incisions to insert surgical instruments to take a sample of lung tissue or fluid, or to do a minor repair.  A small drain will be placed in your chest to drain any blood or fluid  that collects after surgery.  The scope and instruments will be removed.  The incisions will be closed with stitches (sutures).  A bandage (dressing) may be placed over the incisions. The procedure may vary among health care providers and hospitals. What happens after the procedure?  Your blood pressure, heart rate, breathing  rate, and blood oxygen level will be monitored until you leave the hospital or clinic.  You may be given medicine for pain as needed.[email or web address]  Your drain may be removed before you go home.  Do not drive for 24 hours if you were given a sedative. Summary  Thoracoscopy is a procedure to examine your lungs and other structures in your chest.  The procedure uses a thin, lighted telescope with a camera (thoracoscope). Your health care provider inserts this instrument through a small surgical cut (incision) in your chest wall and uses it to look inside your chest.  Thoracoscopy may be done for various reasons. These include diagnosing a disease, removing a tissue (biopsy) or other sample, putting medicines into your lungs, or doing a minor surgical repair. This information is not intended to replace advice given to you by your health care provider. Make sure you discuss any questions you have with your health care provider. Document Revised: 11/23/2017 Document Reviewed: 11/20/2017 Elsevier Patient Education  2020 [employer].

## 2020-10-01 NOTE — Telephone Encounter (Signed)
Patient called and made aware of the following appts (daughter Cephas Darby also listened as well): -told to head to [facility] to get COVID test done today -thoracentesis scheduled for 10/04/20 at 230pm. Arrival time 2pm. At the [facility]. Patient able to eat and drink. Patient does not need to stop Xarelto.  -f/u appt with Dr Genevive Bi 10/08/20 at 1030am. Patient and daughter voiced understanding.

## 2020-10-02 ENCOUNTER — Encounter: Payer: Self-pay | Admitting: Oncology

## 2020-10-02 NOTE — Telephone Encounter (Signed)
LG, please advise. Thanks

## 2020-10-04 ENCOUNTER — Ambulatory Visit
Admission: RE | Admit: 2020-10-04 | Discharge: 2020-10-04 | Disposition: A | Discharge status: Home / Self Care | Payer: Medicare Other | Source: Ambulatory Visit | Attending: Cardiothoracic Surgery | Admitting: Cardiothoracic Surgery

## 2020-10-04 ENCOUNTER — Other Ambulatory Visit: Payer: Self-pay

## 2020-10-04 ENCOUNTER — Other Ambulatory Visit: Payer: Self-pay | Admitting: *Deleted

## 2020-10-04 DIAGNOSIS — J90 Pleural effusion, not elsewhere classified: Secondary | ICD-10-CM

## 2020-10-04 LAB — BODY FLUID CELL COUNT WITH DIFFERENTIAL
Eos, Fluid: 0 %
Lymphs, Fluid: 36 %
Monocyte-Macrophage-Serous Fluid: 55 %
Neutrophil Count, Fluid: 9 %
Total Nucleated Cell Count, Fluid: 56 cu mm

## 2020-10-04 MED ORDER — OLANZAPINE 10 MG PO TABS: 10.0000 mg | ORAL_TABLET | Freq: Every day | ORAL | 1 refills | Status: AC

## 2020-10-04 NOTE — Telephone Encounter (Signed)
Okay to book patient for follow-up.  Then we can schedule for bronchoscopy.  I would have to see him prior to bronchoscopy due to history and physical requirements.

## 2020-10-04 NOTE — Procedures (Signed)
Interventional Radiology Procedure Note  Procedure: Ultrasound guided left thoracentesis  Findings: Please refer to procedural dictation for full description. Loculated effusion. 400 mL dark amber colored fluid removed.  Samples sent for fluid analysis.  Complications: None immediate  Estimated Blood Loss: <5 mL  Recommendations: -Follow up post-thora chest radiograph -Follow up fluid analysis.  Ruthann Cancer, MD Pager: [phone]

## 2020-10-05 ENCOUNTER — Encounter: Payer: Self-pay | Admitting: Oncology

## 2020-10-06 ENCOUNTER — Other Ambulatory Visit: Payer: Self-pay

## 2020-10-06 ENCOUNTER — Encounter: Payer: Self-pay | Admitting: Pulmonary Disease

## 2020-10-06 ENCOUNTER — Ambulatory Visit (INDEPENDENT_AMBULATORY_CARE_PROVIDER_SITE_OTHER): Payer: Medicare Other | Admitting: Pulmonary Disease

## 2020-10-06 VITALS — BP 120/78 | HR 73 | Temp 97.3°F | Ht 71.0 in | Wt 225.8 lb

## 2020-10-06 DIAGNOSIS — C3492 Malignant neoplasm of unspecified part of left bronchus or lung: Secondary | ICD-10-CM | POA: Diagnosis not present

## 2020-10-06 DIAGNOSIS — J90 Pleural effusion, not elsewhere classified: Secondary | ICD-10-CM

## 2020-10-06 DIAGNOSIS — J9601 Acute respiratory failure with hypoxia: Secondary | ICD-10-CM

## 2020-10-06 NOTE — Progress Notes (Addendum)
Subjective:    Patient ID: Derrick Gallegos, male    DOB: [birth year], 69 y.o.   MRN: [id number]  HPI Derrick Gallegos is a 69 year old former smoker (quit 2004) who presents for follow-up on the issue of dyspnea and loculated left pleural effusion.  Patient previously had been evaluated for a left lung mass underwent bronchoscopy on 29 October 2019.  Pathology from that bronchoscopy was consistent with squamous cell carcinoma stage III, unresectable.  Initial tumor burden was high with lung mass measuring 6.3 x 3.4 cm.  He had bilateral mediastinal adenopathy as well as left supraclavicular adenopathy.  The patient underwent concurrent chemo XRT and was started on maintenance durvalumab in February 2021.  Scan showed initial partial response.  The patient also has a right renal mass which has been monitored.  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  A CT scan of the chest in May 2021 did not show evidence of pneumonitis associated with the rheumatic event.  He had a small left pleural effusion decreased size of the mediastinal adenopathy.  There was also post radiation changes.  We evaluated the patient in July due to increasing shortness of breath this was believed to have been secondary to his COPD.  The patient at that time also was being evaluated by cardiology and underwent a 2D echo in of July that showed that the previously noted small pleural effusion on the left had increased dramatically.  Patient had a chest x-ray PA and lateral and subsequently thoracentesis was arranged when the effusion was noted to be larger by chest x-ray.  Was performed on 26 July and at that time 600 mL of serosanguineous fluid was removed.  Noted to be complex and loculated initial cytology and chemistries were unremarkable.  Patient has continued to have increasing shortness of breath and a CT scan of the chest was  performed on 17 August 2020 CT chest on 27 September 2020.  The most recent CT scan shows multiple nodules noted on the right lung base consistent with metastatic disease.  There is also almost complete opacity of the left lung with very loculated pleural effusion enlarging mediastinal adenopathy and increased size of the previously noted right renal mass.  Findings are consistent with recurrent tumor and increase in the previously noted renal mass consistent with renal cell carcinoma.  Patient had thoracentesis performed yesterday, post thoracentesis he has persistent white out of his left lung.  Continues to have issues with increasing dyspnea.  He looks very debilitated.  Gait is very unsteady.  Review of Systems A 10 point review of systems was performed and it is as noted above otherwise negative.    Objective:   Physical Exam BP 120/78 (BP Location: Right Arm, Patient Position: Sitting, Cuff Size: Normal)   Pulse 73   Temp (!) 97.3 F (36.3 C) (Temporal)   Ht 5\' 11"  (1.803 m)   Wt 225 lb 12.8 oz (102.4 kg)   SpO2 93%   BMI 31.49 kg/m  GENERAL: Well-developed, well-nourished gentleman, presents in transport chair due to dyspnea.    Chronically ill-appearing.  Debilitated. HEAD: Normocephalic, atraumatic.  EYES: Pupils equal, round, reactive to light.  No scleral icterus.  MOUTH: Nose/mouth/throat not examined due to masking requirements for COVID 19. NECK: Supple. No thyromegaly. Trachea midline. No JVD.  No adenopathy. PULMONARY: Symmetrical air entry.  Diminished on left three quarters of the way up.  Coarse  breath sounds on right, no other adventitious sounds. CARDIOVASCULAR: S1 and S2. Regular rate and rhythm.  No rubs, murmurs or gallops. GASTROINTESTINAL: Benign. MUSCULOSKELETAL: No joint deformity, no clubbing, no edema.  NEUROLOGIC: No overt focal deficit.  Halting speech at times.   SKIN: Intact,warm,dry.  Limited exam no rashes PSYCH: Mood and behavior normal.:  Ambulatory  oximetry was formed.  It was very difficult due to patient's unsteady gait.  Within 250 feet ambulated the patient desaturated to 87% and shortness of breath increase.  Heart rate also increased.  Patient was placed on 2 L/min via nasal cannula maintaining oxygen saturations at 90% or above.  Chest x-ray after thoracentesis performed yesterday:      Assessment & Plan:     ICD-10-CM   1. Squamous cell carcinoma lung, left (HCC)  C34.92 CANCELED: AMB REFERRAL FOR DME   It appears patient currently has recurrent disease No interventions are possible at this time given the extent of involvement Transition to hospice care  2. Loculated pleural effusion  J90    Due to recurrence of squamous cell carcinoma likely Await cytology from thoracentesis 10/12  3. Acute respiratory failure with hypoxia (HCC)  J96.01    Patient desaturated to 87% with minimal ambulation Will need oxygen at 2 L/min We will defer to hospice service   Discussed with Altha Harm, NP.  Patient will benefit from initiation of hospice care.  I also discussed with Dr. Nestor Lewandowsky who agrees with this.  Patient did have thoracentesis yesterday with no effects on total white out of left lung.  This is consistent with progressive disease.  Patient also on most recent CT shows evidence of metastatic disease to the right lung as well.  Discussion with the patient and his daughter who is present with him today.  Recommend transitioning to hospice care.  Renold Don, MD [facility]   *This note was dictated using voice recognition software/Dragon.  Despite best efforts to proofread, errors can occur which can change the meaning.  Any change was purely unintentional.

## 2020-10-06 NOTE — Patient Instructions (Signed)
Follow-up as needed, we are setting up hospice with Billey Chang, NP.

## 2020-10-07 ENCOUNTER — Ambulatory Visit: Payer: Medicare Other | Admitting: Pulmonary Disease

## 2020-10-07 ENCOUNTER — Encounter: Payer: Self-pay | Admitting: Urology

## 2020-10-07 NOTE — Telephone Encounter (Signed)
I called and left message that the med was sent to [facility] and it will be paid for on [employer] fund

## 2020-10-08 ENCOUNTER — Ambulatory Visit: Payer: Medicare Other | Admitting: Cardiothoracic Surgery

## 2020-10-08 LAB — BODY FLUID CULTURE
Culture: NO GROWTH
Gram Stain: NONE SEEN

## 2020-10-11 LAB — CYTOLOGY - NON PAP

## 2020-10-14 ENCOUNTER — Other Ambulatory Visit: Payer: Self-pay | Admitting: *Deleted

## 2020-10-14 MED ORDER — MORPHINE SULFATE (CONCENTRATE) 20 MG/ML PO SOLN
20.0000 mg | ORAL | 0 refills | Status: DC | PRN
Start: 2020-10-14 — End: 2020-10-18

## 2020-10-14 MED ORDER — HYDROCODONE-HOMATROPINE 5-1.5 MG/5ML PO SYRP
5.0000 mL | ORAL_SOLUTION | Freq: Four times a day (QID) | ORAL | 0 refills | Status: DC | PRN
Start: 2020-10-14 — End: 2020-10-25

## 2020-10-15 ENCOUNTER — Telehealth: Payer: Self-pay | Admitting: Hospice and Palliative Medicine

## 2020-10-15 NOTE — Telephone Encounter (Signed)
I called and spoke with patient's wife. She says the patient has been having "anxiety attacks" at night. Patient is being followed by hospice and they apparently have started him on a new medication for anxiety but she was unsure of what medication. She feels that patient is well cared for with hospice. I encouraged her to call us if there are any changes or concerns.

## 2020-10-18 ENCOUNTER — Telehealth: Payer: Self-pay | Admitting: Hospice and Palliative Medicine

## 2020-10-18 MED ORDER — MORPHINE SULFATE ER 15 MG PO TBCR: 15.0000 mg | EXTENDED_RELEASE_TABLET | Freq: Three times a day (TID) | ORAL | 0 refills | Status: AC

## 2020-10-18 MED ORDER — MORPHINE SULFATE (CONCENTRATE) 20 MG/ML PO SOLN
20.0000 mg | ORAL | 0 refills | Status: DC | PRN
Start: 2020-10-18 — End: 2020-10-25

## 2020-10-18 NOTE — Telephone Encounter (Signed)
I received a call back from patient's hospice nurse.  She spoke with patient's wife and patient/family would like to restart MS Contin but at a lower dose.  We will start MS Contin 15 mg every 8 hours (#45 tablets).  Okay to also liberalize morphine elixir 20 mg daily to 1 to 2 hours as needed for breakthrough pain.  Ensure patient has adequate bowel regimen to prevent opioid-induced constipation.

## 2020-10-18 NOTE — Telephone Encounter (Signed)
I spoke with patient's hospice nurse.  Pain remains poorly controlled.  She was asking for an order to liberalize morphine elixir to 20 mg hourly.  Current order is for morphine elixir 20 mg every 4 hours as needed.  However, she says the patient is no longer taking MS Contin and has not been for weeks.  She says that it made him drowsy.  I encouraged her to again speak with patient and family about restarting MS Contin either at same or lower dose.  Given frequent need for short acting morphine, I think his pain would be overall better controlled with a long-acting analgesic.

## 2020-10-21 ENCOUNTER — Ambulatory Visit: Payer: Medicare Other

## 2020-10-25 ENCOUNTER — Other Ambulatory Visit: Payer: Self-pay | Admitting: Hospice and Palliative Medicine

## 2020-10-25 MED ORDER — HYDROCODONE-HOMATROPINE 5-1.5 MG/5ML PO SYRP
5.0000 mL | ORAL_SOLUTION | Freq: Four times a day (QID) | ORAL | 0 refills | Status: AC | PRN
Start: 2020-10-25 — End: ?

## 2020-10-25 MED ORDER — MORPHINE SULFATE (CONCENTRATE) 20 MG/ML PO SOLN
20.0000 mg | ORAL | 0 refills | Status: AC | PRN
Start: 2020-10-25 — End: ?

## 2020-10-25 NOTE — Progress Notes (Signed)
I received a call from hospice nurse requesting refills of morphine elixir and Hycodan.

## 2020-10-26 ENCOUNTER — Ambulatory Visit: Payer: Medicare Other | Admitting: Urology

## 2020-11-03 ENCOUNTER — Other Ambulatory Visit: Payer: Self-pay | Admitting: Hospice and Palliative Medicine

## 2020-11-03 MED ORDER — LORAZEPAM 1 MG PO TABS: 1.0000 mg | ORAL_TABLET | ORAL | 0 refills | Status: AC | PRN

## 2020-11-03 NOTE — Progress Notes (Signed)
Refill of lorazepam requested by hospice. Will increase frequency to Q4H PRN.

## 2020-11-15 ENCOUNTER — Other Ambulatory Visit: Payer: Medicare Other

## 2020-11-16 ENCOUNTER — Ambulatory Visit: Payer: Medicare Other | Admitting: Oncology

## 2020-11-16 ENCOUNTER — Ambulatory Visit: Payer: Medicare Other | Admitting: Hospice and Palliative Medicine

## 2020-11-24 DEATH — deceased

## 2020-12-27 IMAGING — US US THORACENTESIS ASP PLEURAL SPACE W/IMG GUIDE
1 series · 5 of 5 positions shown · non-contrast
Comparison: none

INDICATION: Patient with history of lung cancer, now with left pleural effusion.
Request made for diagnostic and therapeutic thoracentesis.

[Series 1: us thoracentesis asp pleural space w/img guide · 0.30mm/px · 5 of 5 slices shown]
[im 1/5]
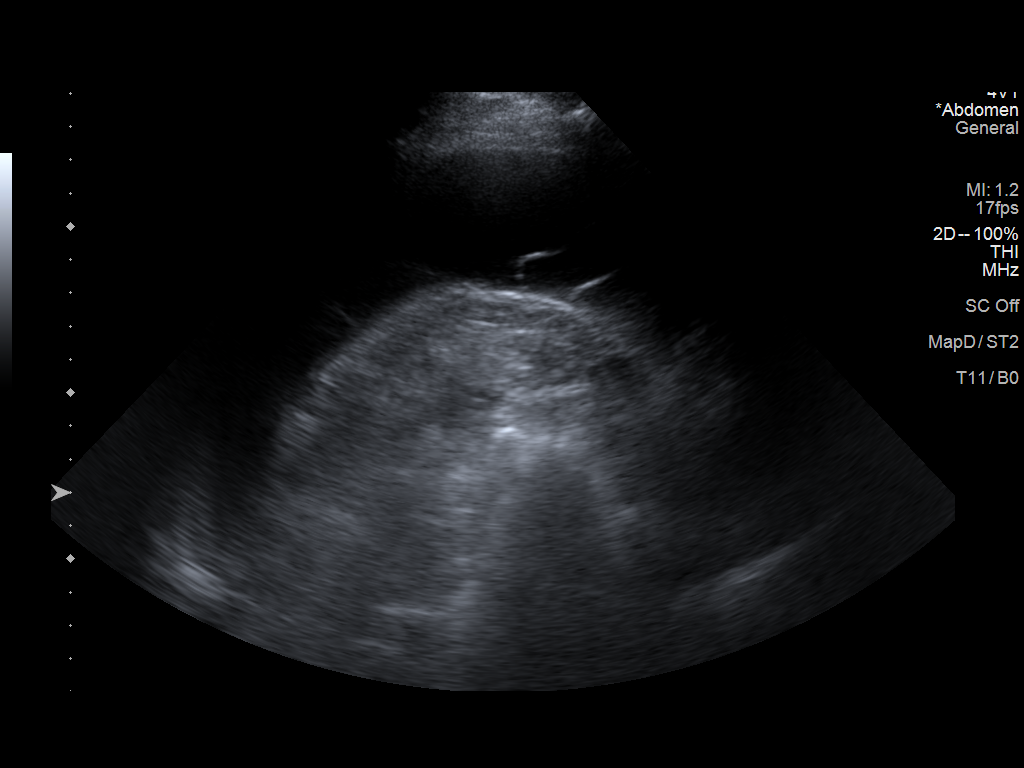
[im 2/5]
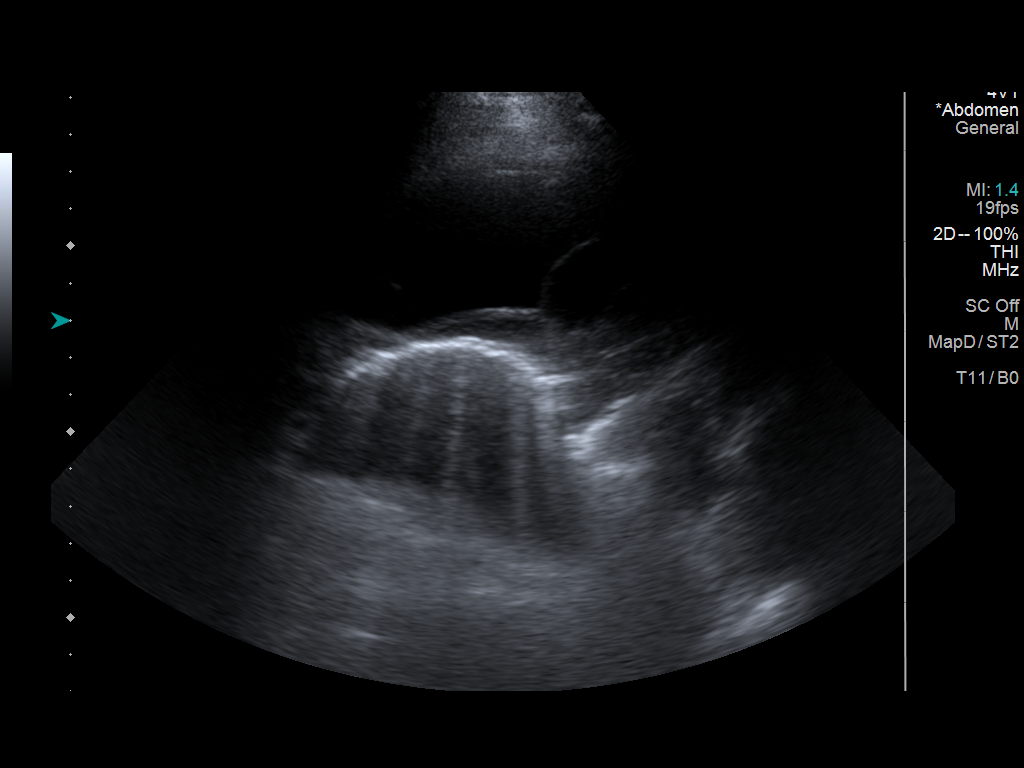
[im 3/5]
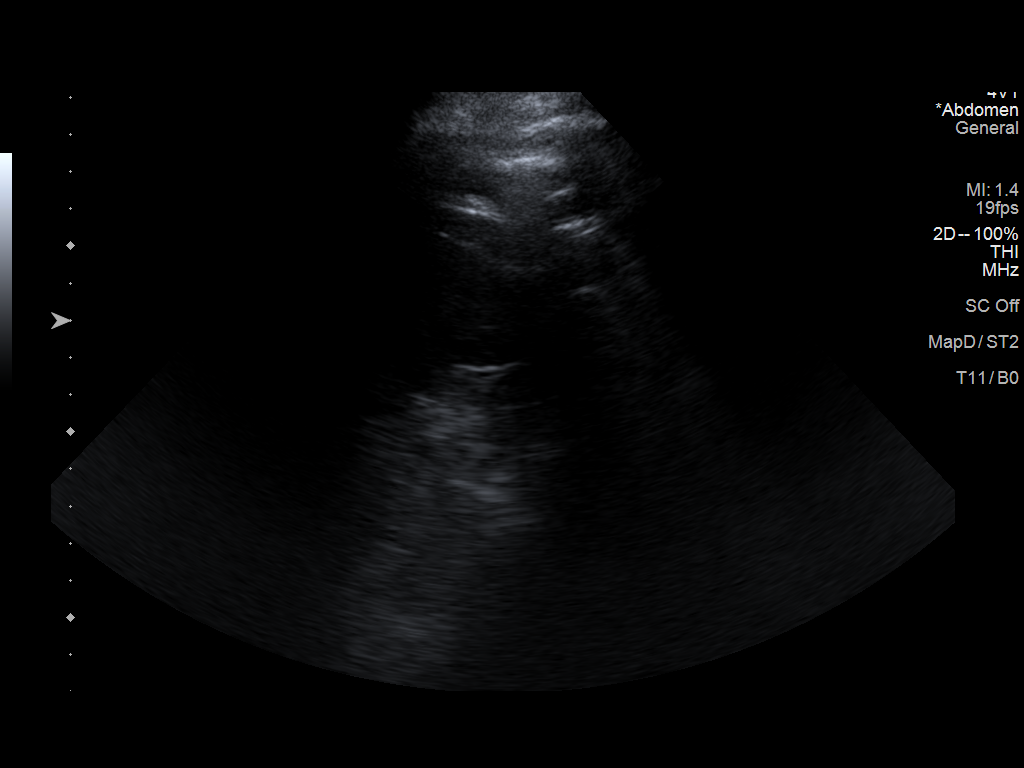
[im 4/5]
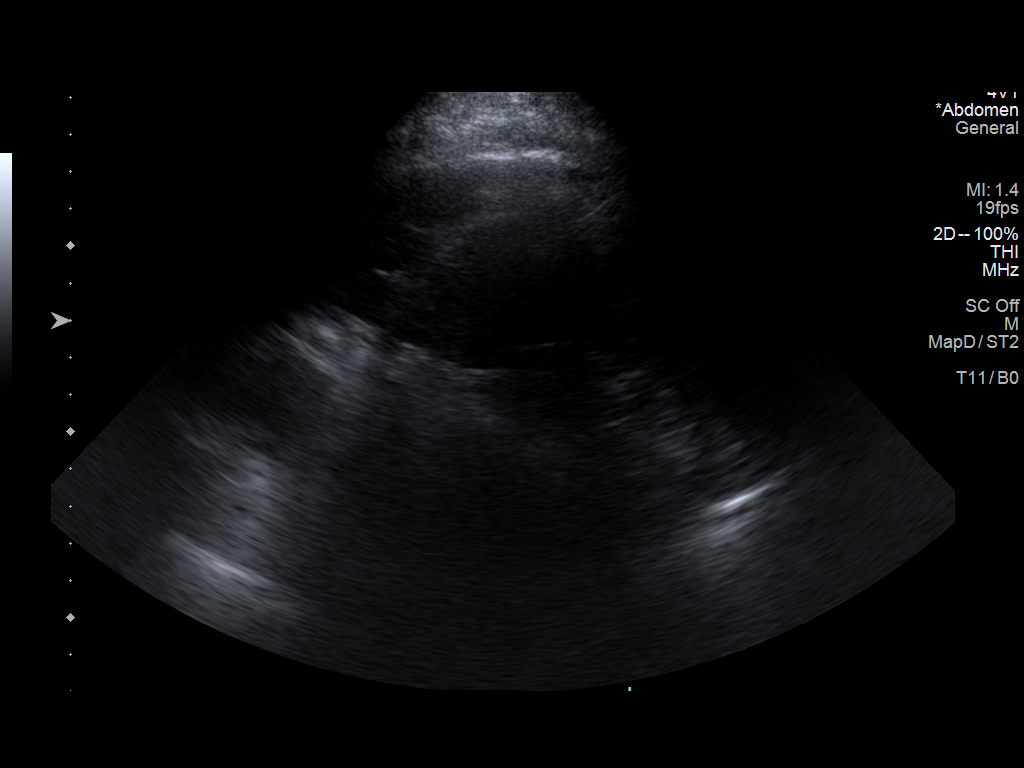
[im 5/5]
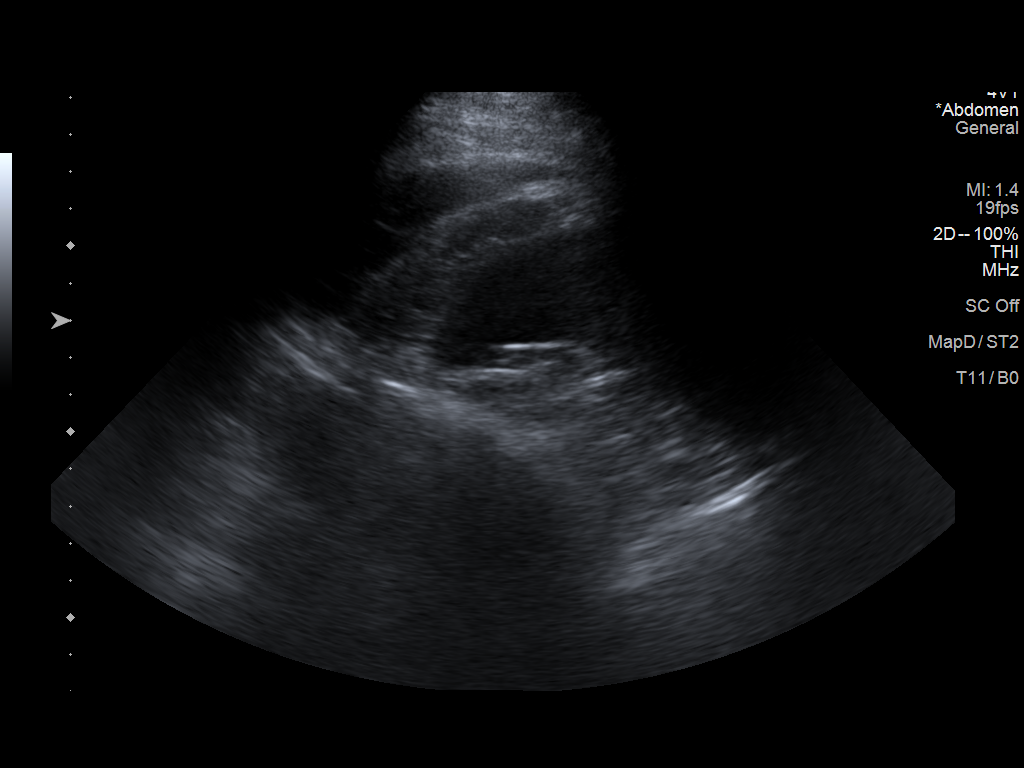

[5 of 5 positions shown; findings below may reference images not displayed]

EXAM:
ULTRASOUND GUIDED LEFT THORACENTESIS

MEDICATIONS:
10 mL 1% lidocaine

COMPLICATIONS:
None immediate.

PROCEDURE:
An ultrasound guided thoracentesis was thoroughly discussed with the
patient and questions answered. The benefits, risks, alternatives
and complications were also discussed. The patient understands and
wishes to proceed with the procedure. Written consent was obtained.

Ultrasound was performed to localize and mark an adequate pocket of
fluid in the left chest. The area was then prepped and draped in the
normal sterile fashion. 1% Lidocaine was used for local anesthesia.
Under ultrasound guidance a 6 Fr Safe-T-Centesis catheter was
introduced. Thoracentesis was performed. The catheter was removed
and a dressing applied.
FINDINGS: A total of approximately 600 mL of dark, bloody fluid was removed.
Samples were sent to the laboratory as requested by the clinical
team.
IMPRESSION: Successful ultrasound guided diagnostic and therapeutic
thoracentesis yielding 600 mL dark, bloody of pleural fluid.
